# Patient Record
Sex: Female | Born: 1935
Health system: Southern US, Community
[De-identification: ages and names within clinical notes are randomized; demographics above are authoritative.]

## PROBLEM LIST (undated history)

## (undated) DIAGNOSIS — E039 Hypothyroidism, unspecified: Secondary | ICD-10-CM

## (undated) DIAGNOSIS — F329 Major depressive disorder, single episode, unspecified: Secondary | ICD-10-CM

## (undated) DIAGNOSIS — F32A Depression, unspecified: Secondary | ICD-10-CM

## (undated) DIAGNOSIS — K219 Gastro-esophageal reflux disease without esophagitis: Secondary | ICD-10-CM

## (undated) DIAGNOSIS — M1990 Unspecified osteoarthritis, unspecified site: Secondary | ICD-10-CM

## (undated) DIAGNOSIS — I209 Angina pectoris, unspecified: Secondary | ICD-10-CM

## (undated) DIAGNOSIS — I10 Essential (primary) hypertension: Secondary | ICD-10-CM

## (undated) DIAGNOSIS — F419 Anxiety disorder, unspecified: Secondary | ICD-10-CM

## (undated) HISTORY — PX: APPENDECTOMY: SHX54

## (undated) HISTORY — PX: OVARIAN CYST REMOVAL: SHX89

## (undated) HISTORY — PX: COLONOSCOPY: SHX174

## (undated) HISTORY — PX: CORONARY ARTERY BYPASS GRAFT: SHX141

## (undated) HISTORY — PX: UPPER GI ENDOSCOPY: SHX6162

## (undated) HISTORY — PX: EYE SURGERY: SHX253

---

## 1997-08-05 ENCOUNTER — Ambulatory Visit (HOSPITAL_COMMUNITY): Admission: RE | Admit: 1997-08-05 | Discharge: 1997-08-05 | Payer: Self-pay | Admitting: Obstetrics & Gynecology

## 1997-09-06 ENCOUNTER — Ambulatory Visit: Admission: RE | Admit: 1997-09-06 | Discharge: 1997-09-06 | Payer: Self-pay | Admitting: Cardiology

## 1997-09-26 ENCOUNTER — Ambulatory Visit (HOSPITAL_COMMUNITY): Admission: RE | Admit: 1997-09-26 | Discharge: 1997-09-26 | Payer: Self-pay | Admitting: Cardiology

## 1997-10-06 ENCOUNTER — Encounter: Payer: Self-pay | Admitting: Cardiothoracic Surgery

## 1997-10-07 ENCOUNTER — Encounter: Payer: Self-pay | Admitting: Cardiothoracic Surgery

## 1997-10-07 ENCOUNTER — Inpatient Hospital Stay (HOSPITAL_COMMUNITY): Admission: RE | Admit: 1997-10-07 | Discharge: 1997-10-12 | Payer: Self-pay | Admitting: Cardiothoracic Surgery

## 1997-10-09 ENCOUNTER — Encounter: Payer: Self-pay | Admitting: Cardiothoracic Surgery

## 1997-11-15 ENCOUNTER — Encounter (HOSPITAL_COMMUNITY): Admission: RE | Admit: 1997-11-15 | Discharge: 1998-02-13 | Payer: Self-pay | Admitting: Cardiology

## 1998-02-14 ENCOUNTER — Encounter (HOSPITAL_COMMUNITY): Admission: RE | Admit: 1998-02-14 | Discharge: 1998-05-15 | Payer: Self-pay | Admitting: Cardiology

## 1998-08-04 ENCOUNTER — Ambulatory Visit (HOSPITAL_COMMUNITY): Admission: RE | Admit: 1998-08-04 | Discharge: 1998-08-04 | Payer: Self-pay | Admitting: Obstetrics & Gynecology

## 1999-08-10 ENCOUNTER — Ambulatory Visit (HOSPITAL_COMMUNITY): Admission: RE | Admit: 1999-08-10 | Discharge: 1999-08-10 | Payer: Self-pay | Admitting: Obstetrics & Gynecology

## 1999-09-11 ENCOUNTER — Ambulatory Visit (HOSPITAL_COMMUNITY): Admission: RE | Admit: 1999-09-11 | Discharge: 1999-09-11 | Payer: Self-pay | Admitting: Obstetrics & Gynecology

## 1999-10-05 ENCOUNTER — Ambulatory Visit (HOSPITAL_COMMUNITY): Admission: RE | Admit: 1999-10-05 | Discharge: 1999-10-05 | Payer: Self-pay | Admitting: Obstetrics & Gynecology

## 1999-10-05 ENCOUNTER — Other Ambulatory Visit: Admission: RE | Admit: 1999-10-05 | Discharge: 1999-10-05 | Payer: Self-pay | Admitting: Obstetrics & Gynecology

## 1999-10-17 ENCOUNTER — Other Ambulatory Visit: Admission: RE | Admit: 1999-10-17 | Discharge: 1999-10-17 | Payer: Self-pay | Admitting: *Deleted

## 2000-09-23 ENCOUNTER — Encounter: Payer: Self-pay | Admitting: Internal Medicine

## 2001-10-29 ENCOUNTER — Other Ambulatory Visit: Admission: RE | Admit: 2001-10-29 | Discharge: 2001-10-29 | Payer: Self-pay | Admitting: *Deleted

## 2003-02-10 ENCOUNTER — Other Ambulatory Visit: Admission: RE | Admit: 2003-02-10 | Discharge: 2003-02-10 | Payer: Self-pay | Admitting: *Deleted

## 2004-02-18 ENCOUNTER — Emergency Department (HOSPITAL_COMMUNITY): Admission: EM | Admit: 2004-02-18 | Discharge: 2004-02-18 | Payer: Self-pay | Admitting: Family Medicine

## 2004-12-16 ENCOUNTER — Encounter: Admission: RE | Admit: 2004-12-16 | Discharge: 2004-12-16 | Payer: Self-pay | Admitting: Internal Medicine

## 2005-01-01 ENCOUNTER — Encounter: Admission: RE | Admit: 2005-01-01 | Discharge: 2005-01-01 | Payer: Self-pay | Admitting: Internal Medicine

## 2005-01-15 ENCOUNTER — Encounter: Admission: RE | Admit: 2005-01-15 | Discharge: 2005-01-15 | Payer: Self-pay | Admitting: Internal Medicine

## 2007-03-04 ENCOUNTER — Encounter: Admission: RE | Admit: 2007-03-04 | Discharge: 2007-03-04 | Payer: Self-pay | Admitting: Cardiology

## 2007-03-06 ENCOUNTER — Encounter: Admission: RE | Admit: 2007-03-06 | Discharge: 2007-03-06 | Payer: Self-pay | Admitting: Cardiology

## 2007-05-06 ENCOUNTER — Ambulatory Visit: Payer: Self-pay | Admitting: Internal Medicine

## 2007-05-06 DIAGNOSIS — K5730 Diverticulosis of large intestine without perforation or abscess without bleeding: Secondary | ICD-10-CM | POA: Insufficient documentation

## 2007-05-06 DIAGNOSIS — I2510 Atherosclerotic heart disease of native coronary artery without angina pectoris: Secondary | ICD-10-CM | POA: Insufficient documentation

## 2007-05-06 DIAGNOSIS — K3189 Other diseases of stomach and duodenum: Secondary | ICD-10-CM | POA: Insufficient documentation

## 2007-05-06 DIAGNOSIS — R1013 Epigastric pain: Secondary | ICD-10-CM

## 2007-05-06 DIAGNOSIS — R12 Heartburn: Secondary | ICD-10-CM | POA: Insufficient documentation

## 2007-05-06 DIAGNOSIS — J387 Other diseases of larynx: Secondary | ICD-10-CM | POA: Insufficient documentation

## 2007-05-06 DIAGNOSIS — K219 Gastro-esophageal reflux disease without esophagitis: Secondary | ICD-10-CM | POA: Insufficient documentation

## 2007-05-13 ENCOUNTER — Encounter: Payer: Self-pay | Admitting: Internal Medicine

## 2007-05-13 ENCOUNTER — Ambulatory Visit (HOSPITAL_COMMUNITY): Admission: RE | Admit: 2007-05-13 | Discharge: 2007-05-13 | Payer: Self-pay | Admitting: Internal Medicine

## 2007-05-26 ENCOUNTER — Encounter: Admission: RE | Admit: 2007-05-26 | Discharge: 2007-05-26 | Payer: Self-pay | Admitting: Internal Medicine

## 2007-06-02 ENCOUNTER — Encounter: Payer: Self-pay | Admitting: Internal Medicine

## 2007-06-02 ENCOUNTER — Ambulatory Visit (HOSPITAL_COMMUNITY): Admission: RE | Admit: 2007-06-02 | Discharge: 2007-06-02 | Payer: Self-pay | Admitting: Internal Medicine

## 2007-06-11 ENCOUNTER — Ambulatory Visit: Payer: Self-pay | Admitting: Internal Medicine

## 2008-06-07 ENCOUNTER — Ambulatory Visit (HOSPITAL_COMMUNITY): Admission: RE | Admit: 2008-06-07 | Discharge: 2008-06-07 | Payer: Self-pay | Admitting: Internal Medicine

## 2009-06-11 IMAGING — CR DG CHEST 2V
2 series · 2 of 2 positions shown · non-contrast
Comparison: 03/04/2007

CLINICAL DATA: Right chest wall pain following fall

CHEST - 2 VIEW

[w chest pa]
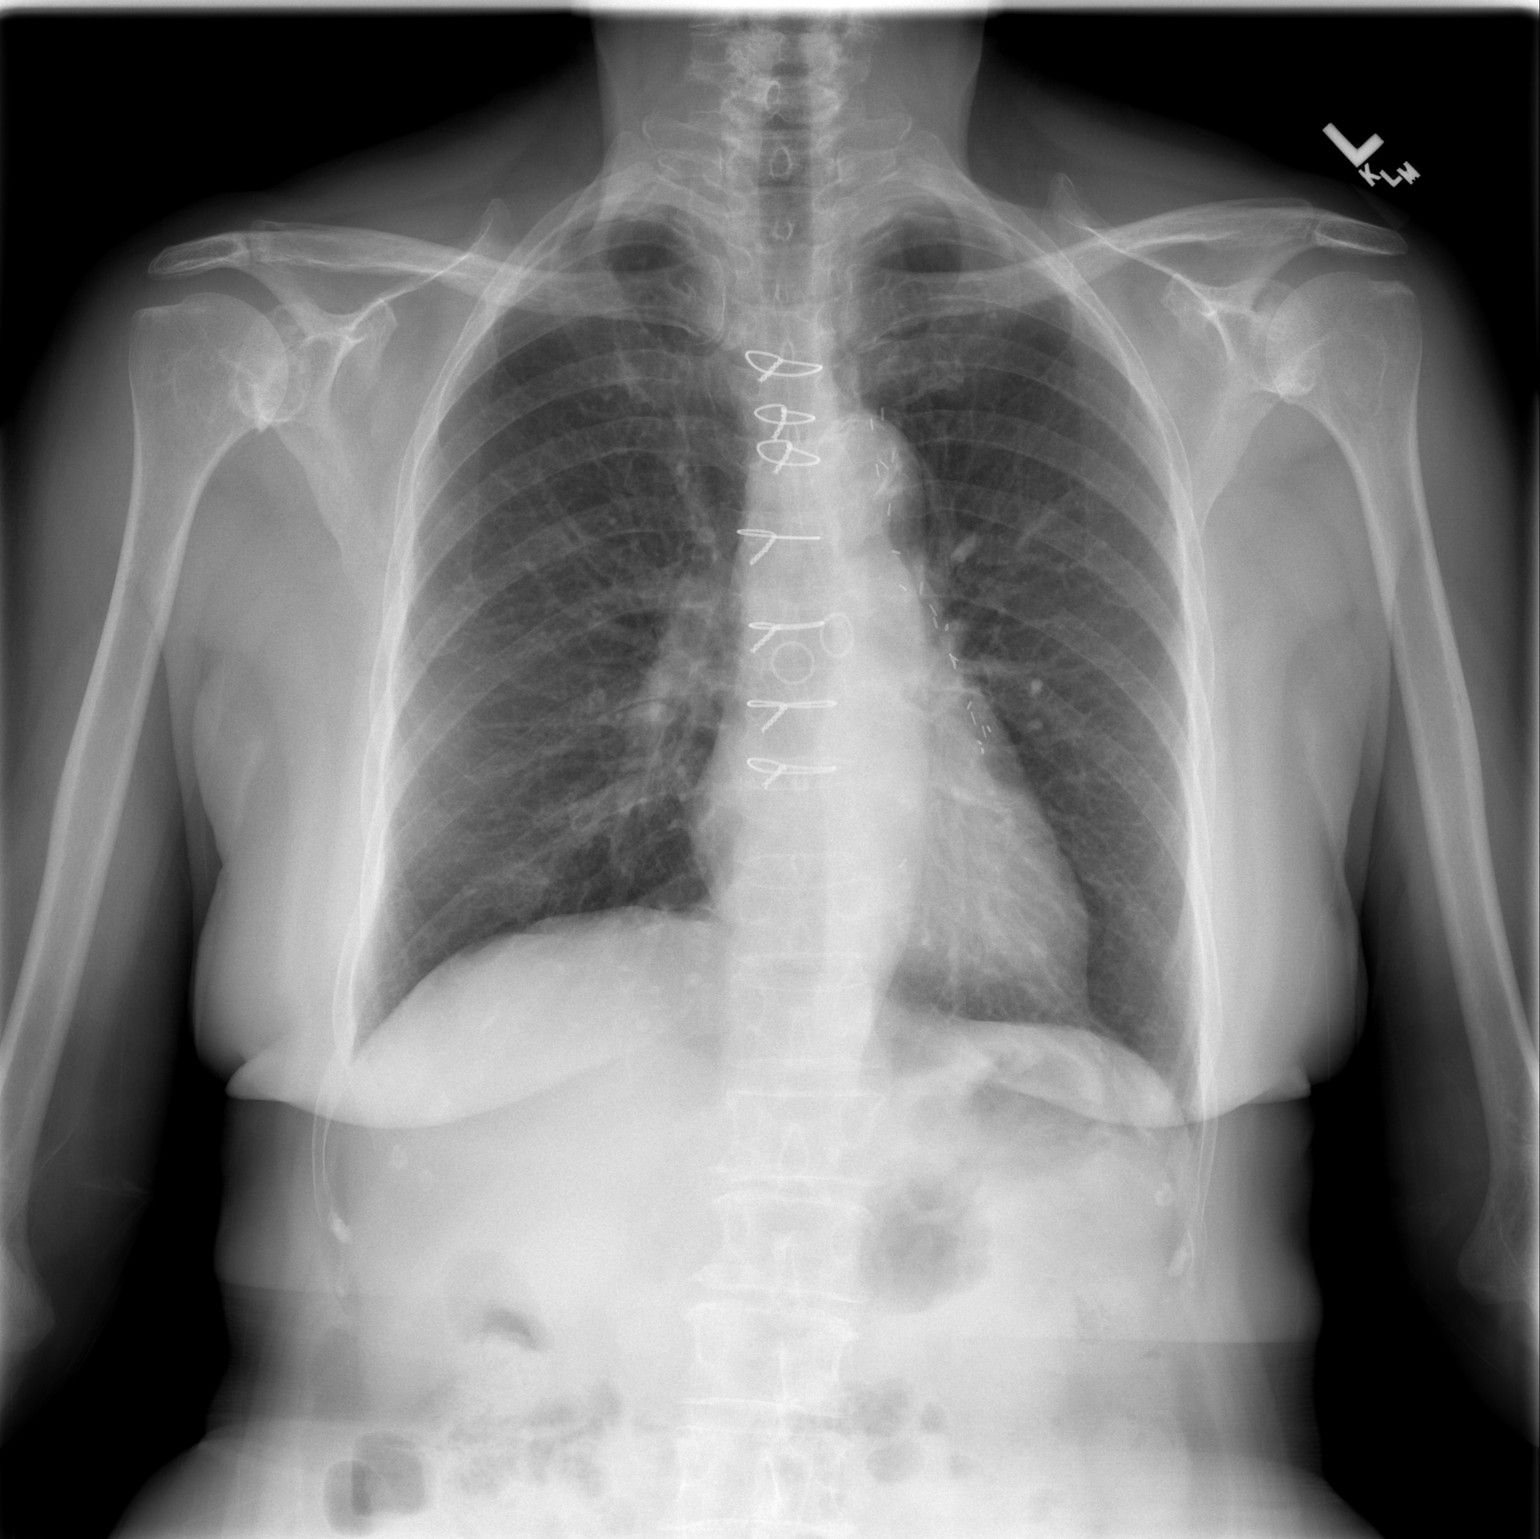

[w chest lat]
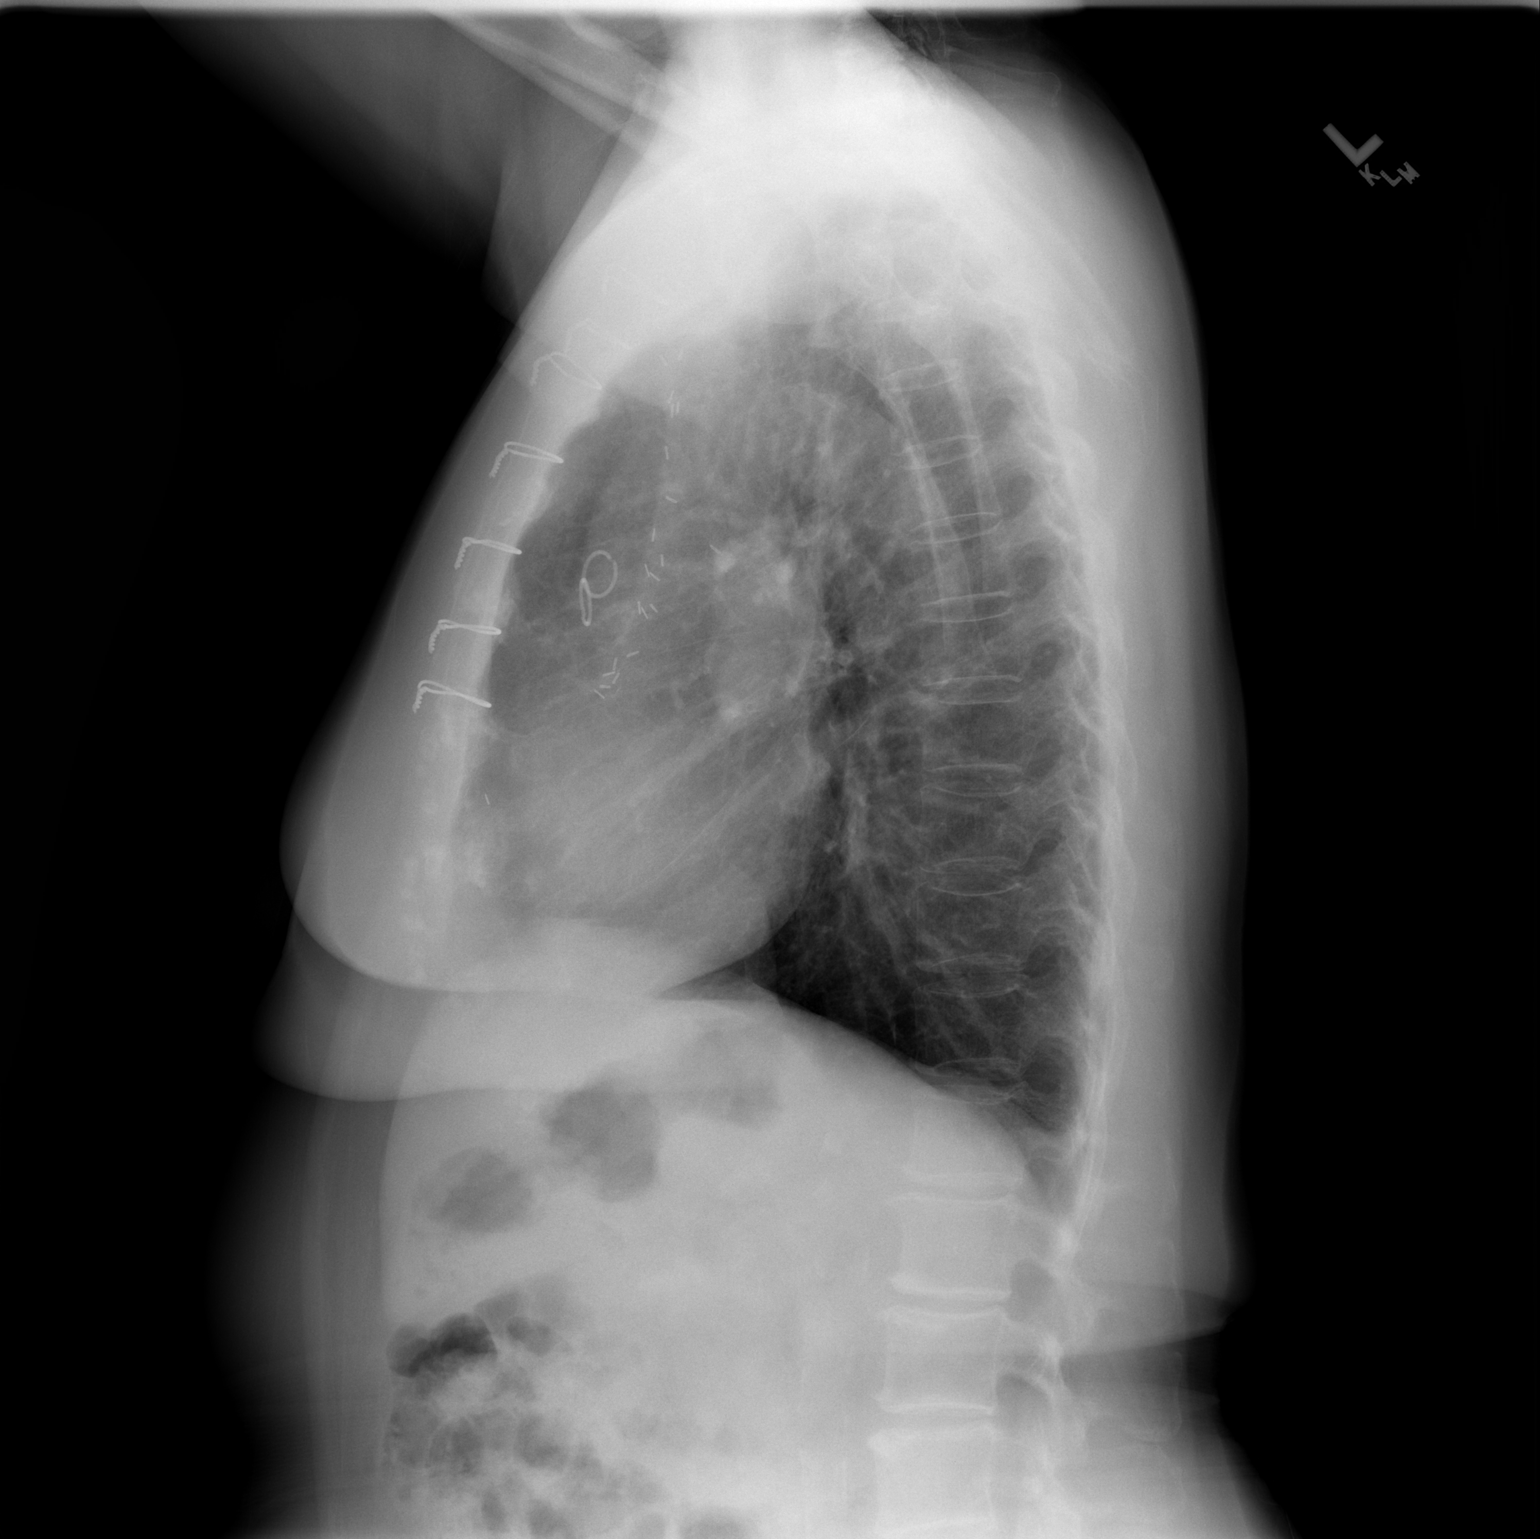

[2 of 2 positions shown; findings below may reference images not displayed]

FINDINGS: Post CABG.  Heart and lungs normal.  No definite rib
fractures pneumothorax or pleural fluid.
IMPRESSION: 1.Post CABG - no active disease.
2.  No visible rib fractures or acute changes.

## 2009-06-13 ENCOUNTER — Ambulatory Visit: Payer: Self-pay | Admitting: Radiology

## 2009-06-13 ENCOUNTER — Emergency Department (HOSPITAL_BASED_OUTPATIENT_CLINIC_OR_DEPARTMENT_OTHER): Admission: EM | Admit: 2009-06-13 | Discharge: 2009-06-13 | Payer: Self-pay | Admitting: Emergency Medicine

## 2009-07-03 ENCOUNTER — Encounter (INDEPENDENT_AMBULATORY_CARE_PROVIDER_SITE_OTHER): Payer: Self-pay | Admitting: *Deleted

## 2009-07-28 DIAGNOSIS — R197 Diarrhea, unspecified: Secondary | ICD-10-CM | POA: Insufficient documentation

## 2009-07-28 DIAGNOSIS — R498 Other voice and resonance disorders: Secondary | ICD-10-CM

## 2009-07-28 DIAGNOSIS — K2990 Gastroduodenitis, unspecified, without bleeding: Secondary | ICD-10-CM

## 2009-07-28 DIAGNOSIS — K2970 Gastritis, unspecified, without bleeding: Secondary | ICD-10-CM | POA: Insufficient documentation

## 2009-08-16 ENCOUNTER — Inpatient Hospital Stay (HOSPITAL_COMMUNITY): Admission: EM | Admit: 2009-08-16 | Discharge: 2009-08-18 | Payer: Self-pay | Admitting: Emergency Medicine

## 2009-08-16 ENCOUNTER — Encounter (INDEPENDENT_AMBULATORY_CARE_PROVIDER_SITE_OTHER): Payer: Self-pay | Admitting: Cardiology

## 2009-09-06 ENCOUNTER — Ambulatory Visit: Payer: Self-pay | Admitting: Internal Medicine

## 2009-10-13 ENCOUNTER — Emergency Department (HOSPITAL_COMMUNITY): Admission: EM | Admit: 2009-10-13 | Discharge: 2009-10-13 | Payer: Self-pay | Admitting: Emergency Medicine

## 2009-10-13 ENCOUNTER — Emergency Department (HOSPITAL_COMMUNITY): Admission: EM | Admit: 2009-10-13 | Discharge: 2009-10-14 | Payer: Self-pay | Admitting: Emergency Medicine

## 2010-03-01 NOTE — Letter (Signed)
Summary: Appt Reminder 2  [facility]  [address] [place], [place] [address]   Phone: [phone]  Fax: [phone]        July 03, 2009 MRN: [id number]    Wendy Wiley [address] [place], [place]  [address]    Dear [name],   You have a return appointment with Dr. Juanda Chance on July 7th at 11am.  Please remember to bring a complete list of the medicines you are taking, your insurance card and your co-pay.  If you have to cancel or reschedule this appointment, please call before 5:00 pm the evening before to avoid a cancellation fee.  If you have any questions or concerns, please call [phone].    Sincerely,  [employer]

## 2010-03-01 NOTE — Assessment & Plan Note (Signed)
Summary: RECURRENT DIARRHEA/YF   History of Present Illness Visit Type: Follow-up Visit Primary GI MD: Lina Sar MD Primary Provider: Rodrigo Ran, MD Requesting Provider: Rodrigo Ran, MD Chief Complaint: Patient states that she had diarrhea and vomiting over a month and a half ago but nothing since. She is not sure why she is here she is having no problems now, sha had a sooner appt but changed it to today.  History of Present Illness:   This is an 77 year old white female who is status post acute gastroenteritis presenting with dyspepsia, nausea and diarrhea. It has subsided spontaneously and she has been well now for several weeks. She denies any rectal bleeding. Her last colonoscopy in 2002 was normal except for mild diverticulosis. She also had an upper endoscopy in 2002 and again in May 2009 which showed gastritis. She denies any abdominal pain. While having diarrhea, patient lost 5 pounds but has regained it. 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   GI Review of Systems      Denies abdominal pain, acid reflux, belching, bloating, chest pain, dysphagia with liquids, dysphagia with solids, heartburn, loss of appetite, nausea, vomiting, vomiting blood, weight loss, and  weight gain.        Denies anal fissure, black tarry stools, change in bowel habit, constipation, diarrhea, diverticulosis, fecal incontinence, heme positive stool, hemorrhoids, irritable bowel syndrome, jaundice, light color stool, liver problems, rectal bleeding, and  rectal pain. Preventive Screening-Counseling & Management  Alcohol-Tobacco     Smoking Status: never    Current Medications (verified): 1)  Nexium 40 Mg Cpdr (Esomeprazole Magnesium) .... Take One By Mouth Once Daily 2)  Thyrolar-2 120 (25-100) Mg (Mcg) Tabs (Liotrix (T3-T4)) .... Take One By Mouth Every Day For Three Days Then 1.5 Tabs By Mouth For The Other Fours Days 3)  Lipitor 10 Mg Tabs  (Atorvastatin Calcium) .... Take Oen By Mouth Once Daily 4)  Triamterene-Hctz 37.5-25 Mg Tabs (Triamterene-Hctz) .... Take One By Mouth Once Daily 5)  Aspirin 81 Mg Tbec (Aspirin) .... Take One By Mouth Once Daily 6)  Icaps  Caps (Multiple Vitamins-Minerals) .... Take One By Mouth Two Times A Day 7)  One-A-Day Extras Antioxidant  Caps (Multiple Vitamins-Minerals) .... Take One By Mouth Once Daily 8)  Move Free Advanced Tablets 1500mg  .... Take Two By Mouth Once Daily 9)  Fish Oil 1000 Mg Caps (Omega-3 Fatty Acids) .... Take One By Mouth Two Times A Day 10)  Lexapro 10 Mg Tabs (Escitalopram Oxalate) .... Take 1/2 Tablet By Mouth Once Daily 11)  Norvasc 2.5 Mg Tabs (Amlodipine Besylate) .... Take One By Mouth Once Daily  Allergies (verified): 1)  ! Cipro 2)  ! Codeine 3)  ! Quinine  Past History:  Past Medical History: Reviewed history from 07/28/2009 and no changes required. Current Problems:  DIARRHEA (ICD-787.91) Hx of GASTRITIS (ICD-535.50) DYSPHONIA, SPASTIC (ICD-784.49) DYSPEPSIA (ICD-536.8) GERD (ICD-530.81) HEARTBURN (ICD-787.1) CAD (ICD-414.00) Hx of DIVERTICULOSIS, MILD (ICD-562.10) OTHER DISEASES OF LARYNX (ICD-478.79)    Past Surgical History: CABG Botox injection into the vocal cords every 5-6 months tumors removed from overies  Family History: No FH of Colon Cancer: Family History of Stomach Cancer: maternal aunt Family History of Heart Disease: father, brother  Social History: Alcohol Use - no Illicit Drug Use - no Patient has never smoked.  Daily Caffeine Use coffee  Review of Systems       The patient complains of allergy/sinus, anxiety-new, arthritis/joint pain, back  pain, change in vision, confusion, cough, depression-new, fatigue, sleeping problems, thirst - excessive, urination - excessive, and voice change.  The patient denies anemia, blood in urine, breast changes/lumps, coughing up blood, fainting, fever, headaches-new, hearing problems, heart  murmur, heart rhythm changes, itching, menstrual pain, muscle pains/cramps, night sweats, nosebleeds, pregnancy symptoms, shortness of breath, skin rash, sore throat, swelling of feet/legs, swollen lymph glands, thirst - excessive , urination - excessive , urination changes/pain, urine leakage, and vision changes.         Pertinent positive and negative review of systems were noted in the above HPI. All other ROS was otherwise negative.   Vital Signs:  Patient profile:   77 year old female Height:      57 inches Weight:      118.0 pounds BMI:     25.63 Pulse rate:   64 / minute Pulse rhythm:   regular BP sitting:   118 / 54  (left arm) Cuff size:   regular  Vitals Entered By: Harlow Mares CMA Duncan Dull) (September 06, 2009 2:19 PM)  Physical Exam  General:  Well developed, well nourished, no acute distress. Eyes:  PERRLA, no icterus. Mouth:  No deformity or lesions, dentition normal. Neck:  left carotid bruit , no bruit in the right carotid Lungs:  Clear throughout to auscultation. Heart:  soft 2/6 systolic murmur. Extremities:  No clubbing, cyanosis, edema or deformities noted. Skin:  Intact without significant lesions or rashes. Psych:  Alert and cooperative. Normal mood and affect.   Impression & Recommendations:  Problem # 1:  DIARRHEA (ICD-787.91) Patient's diarrhea has subsided. She had viral gastroenteritis. No further diagnostic workup is necessary. She will be due for a screening colonoscopy in August 2012. She will be 77 years old at that time and we will decide whether it is indicated at that time.  Problem # 2:  DYSPHONIA, SPASTIC (ICD-784.49) Patient is to continue treatments at [facility].  Problem # 3:  GERD (ICD-530.81) Patient is asymptomatic. She is to continue Nexium 40 mg daily.  Problem # 4:  CAD (ICD-414.00) Patient had a left carotid bruit on today's exam. I have suggested she talks about it with Dr. Waynard Edwards and with her cardiologist.  Patient  Instructions: 1)  continue Nexium 40 mg daily. 2)  Consider recall colonoscopy August 2012. 3)  Return p.r.n. 4)  Copy sent to : Dr Waynard Edwards 5)  The medication list was reviewed and reconciled.  All changed / newly prescribed medications were explained.  A complete medication list was provided to the patient / caregiver.

## 2010-03-01 NOTE — Procedures (Signed)
Summary: COLON   Colonoscopy  Procedure date:  09/23/2000  Findings:      Location:  [facility].   Patient Name: Wendy Wiley, Wendy Wiley MRN: [id number] Procedure Procedures: Colonoscopy CPT: [phone].  Personnel: Endoscopist: Trelyn Vanderlinde L. Juanda Chance, MD.  Referred By: Rodrigo Ran, MD.  Exam Location: Exam performed in Outpatient Clinic. Outpatient  Patient Consent: Procedure, Alternatives, Risks and Benefits discussed, consent obtained, from patient.  Indications  Average Risk Screening Routine.  History  Pre-Exam Physical: Performed Sep 23, 2000. Cardio-pulmonary exam, HEENT exam , Abdominal exam, Extremity exam, Neurological exam, Mental status exam WNL.  Exam Exam: Extent of exam reached: Cecum, extent intended: Cecum.  Colon retroflexion performed. Images taken. ASA Classification: II. Tolerance: good.  Monitoring: Pulse and BP monitoring, Oximetry used. Supplemental O2 given.  Colon Prep Used Golytely for colon prep. Prep results: fair.  Sedation Meds: Patient assessed and found to be appropriate for moderate (conscious) sedation. Fentanyl 50 mcg. Versed 2 mg.  Findings , IMAGE TAKEN - DIVERTICULOSIS: Sigmoid Colon. Not bleeding. ICD9: Diverticulosis: 562.10.   Assessment Abnormal examination, see findings above.  Diagnoses: 562.10: Diverticulosis.   Comments: mild diverticulosis Events  Unplanned Interventions: No intervention was required.  Unplanned Events: There were no complications. Plans Patient Education: Patient given standard instructions for: Yearly hemoccult testing recommended. Patient instructed to get routine colonoscopy every 10 years.  Comments: high fiber diet Disposition: After procedure patient sent to recovery. After recovery patient sent home.   This report was created from the original endoscopy report, which was reviewed and signed by the above listed endoscopist.

## 2010-04-14 LAB — CBC
HCT: 32.8 % — ABNORMAL LOW (ref 36.0–46.0)
Hemoglobin: 13.5 g/dL (ref 12.0–15.0)
MCH: 29.8 pg (ref 26.0–34.0)
MCHC: 33.5 g/dL (ref 30.0–36.0)
MCV: 88.9 fL (ref 78.0–100.0)
RBC: 4.55 MIL/uL (ref 3.87–5.11)
RDW: 15.7 % — ABNORMAL HIGH (ref 11.5–15.5)
WBC: 13.7 10*3/uL — ABNORMAL HIGH (ref 4.0–10.5)

## 2010-04-14 LAB — DIFFERENTIAL
Basophils Absolute: 0 10*3/uL (ref 0.0–0.1)
Basophils Relative: 0 % (ref 0–1)
Eosinophils Absolute: 0.1 10*3/uL (ref 0.0–0.7)
Eosinophils Relative: 1 % (ref 0–5)
Eosinophils Relative: 3 % (ref 0–5)
Lymphs Abs: 1.3 10*3/uL (ref 0.7–4.0)
Monocytes Absolute: 1.2 10*3/uL — ABNORMAL HIGH (ref 0.1–1.0)
Monocytes Absolute: 1.4 10*3/uL — ABNORMAL HIGH (ref 0.1–1.0)
Monocytes Relative: 9 % (ref 3–12)
Neutro Abs: 11.1 10*3/uL — ABNORMAL HIGH (ref 1.7–7.7)
Neutrophils Relative %: 81 % — ABNORMAL HIGH (ref 43–77)

## 2010-04-14 LAB — BASIC METABOLIC PANEL
CO2: 27 mEq/L (ref 19–32)
Calcium: 9 mg/dL (ref 8.4–10.5)
GFR calc Af Amer: >60 mL/min (ref >60)
GFR calc non Af Amer: >60 mL/min (ref >60)
Sodium: 134 mEq/L — ABNORMAL LOW (ref 135–145)

## 2010-04-14 LAB — COMPREHENSIVE METABOLIC PANEL
ALT: 18 U/L (ref 0–35)
Albumin: 3.3 g/dL — ABNORMAL LOW (ref 3.5–5.2)
Alkaline Phosphatase: 66 U/L (ref 39–117)
Glucose, Bld: 113 mg/dL — ABNORMAL HIGH (ref 70–99)
Potassium: 4 mEq/L (ref 3.5–5.1)
Sodium: 134 mEq/L — ABNORMAL LOW (ref 135–145)
Total Protein: 6.1 g/dL (ref 6.0–8.3)

## 2010-04-14 LAB — CULTURE, BLOOD (ROUTINE X 2): Culture: NO GROWTH

## 2010-04-14 LAB — CK TOTAL AND CKMB (NOT AT ARMC)
CK, MB: 1.2 ng/mL (ref 0.3–4.0)
Relative Index: INVALID (ref 0.0–2.5)
Total CK: 34 U/L (ref 7–177)
Total CK: 44 U/L (ref 7–177)

## 2010-04-14 LAB — PROTIME-INR: Prothrombin Time: 12.5 seconds (ref 11.6–15.2)

## 2010-04-14 LAB — D-DIMER, QUANTITATIVE: D-Dimer, Quant: 0.38 ug/mL-FEU (ref 0.00–0.48)

## 2010-06-12 NOTE — Assessment & Plan Note (Signed)
[facility]                         GASTROENTEROLOGY OFFICE NOTE   NAME:Wendy Wiley, Wendy Wiley                        MRN:          [id number]  DATE:05/06/2007                            DOB:          1922/04/25    Ms. Jahr is an 77 year old white female who is sent by Dr. Waynard Edwards for  evaluation of indigestion. Her specific complaints are nocturnal cough,  choking on solids and liquids, belching, and uneasy feeling in her  epigastrium. This has been going on chronically for many years. In fact,  we saw Ms. Butchko with similar problems in 2002 when she underwent upper  endoscopy. Her H. pylori test was negative, and her exam was essentially  normal. She since then has had continued problems with spastic dysphonia  for which received Botox injections every 5 to 6 months at [facility] in [place]. She has occasional water brash reflux at  night and hoarseness. The pain in the upper abdomen was evaluated to  rule out cardiac causes, and Dr. Donnie Aho has fully evaluated her for it.  The discomfort in the chest and epigastrium occurs mostly after meals.  The symptoms have not improved after she discontinued Fosamax 70 mg  weekly. She has not taken the medicine for the past 2 weeks. Upper  abdominal ultrasound done by Dr. Waynard Edwards in February 2009 was negative.   MEDICATIONS:  1. Tri-Vita, B12 sublingually.  2. Nexium 40 mg q.a.m.  3. Amlodipine 2.5 mg q.p.m.  4. Lipitor 10 mg h.s.  5. Zetia 10 mg h.s.  6. Ramipril 2.5 mg every day.  7. Thyroid 1 to 1-1/2 tablets every other day, alternating dose.  8. Triamterene HCTZ 37.5/25 one p.o. every day.  9. Lexapro 10 mg 1/2 tablet q.p.m.  10.Aspirin 81 mg q.p.m.  11.Multivitamins.  12.Fish oil.  13.Calcium supplements.   PAST HISTORY:  1. Spastic dysphonia.  2. High blood pressure.  3. Hyperlipidemia.  4. Thyroid problems.  5. Arthritis.  6. Anxiety.  7. Depression.   ALLERGIES:  None.   OPERATIONS:  Coronary artery bypass graft around 1999.   FAMILY HISTORY:  Negative for colon cancer.   SOCIAL HISTORY:  Married with 2 children. She is has some college  education. She does not smoke and does not drink alcohol.   REVIEW OF SYSTEMS:  Positive for fever, eyeglasses, allergies, frequent  cough, arthritic complaints, sleeping problems, night sweats, vision  changes, back pain, and leakage of urine.   PHYSICAL EXAMINATION:  VITAL SIGNS:  Blood pressure 110/60, pulse 60,  weight 118 pounds.  HEENT:  She had spastic dysphonia. There was no cough during our exam.  Oral cavity was normal.  NECK:  Supply, no lymphadenopathy.  LUNGS:  Clear to auscultation.  COR:  Normal S1, normal S2.  ABDOMEN:  Soft, mildly protuberant but nontender with normoactive bowel  sounds. Well-healed thoracotomy scar extending into the upper abdomen.  Liver edge at the costal margin. Lower abdomen was normal.  RECTAL:  Soft, Hemoccult negative stool.  EXTREMITIES:  No edema.   IMPRESSION:  1. An 77 year old white female with nonspecific dyspepsia of  several      years' duration. She has additional problems of dysphagia and      nocturnal cough suggestive of gastroesophageal reflux or possibly      nocturnal aspiration. I would consider possibility of esophageal      dysmotility or possibly upper esophageal sphincter dysfunction. Her      ultrasound of the gallbladder was negative. Discontinuation of      Fosamax did not result in an improvement of her symptoms, although      the interval since Fosamax was discontinued has been only 2 weeks,      and in my experience it sometimes takes several months for the      gastrointestinal symptoms to disappear.  2. Known diverticulosis from last colonoscopy done in 2002.   PLAN:  1. Modified barium swallow with speech pathology to assess her      swallowing. Rule out penetration and aspiration.  2. Upper endoscopy to assess for any anatomic abnormality  such as      hiatal hernia, stricture.  3. Nexium 40 mg q.a.m. Add Prilosec 20 mg q.p.m.  4. Continue to hold Fosamax at this time.  5. Consider esophageal motility assessment depending on the results of      the modified barium swallow.     Hedwig Morton. Juanda Chance, MD  Electronically Signed    DMB/MedQ  DD: 05/06/2007  DT: 05/06/2007  Job #: [id number]   cc:   Georga Hacking, M.D.  Redge Gainer. Perini, M.D.

## 2011-09-02 IMAGING — CR DG CHEST 2V
2 series · 2 of 2 positions shown · non-contrast
Comparison: 05/26/2007

CLINICAL DATA: Left chest pain.  Hypertension.  Shortness of
breath.

CHEST - 2 VIEW

[w chest pa]
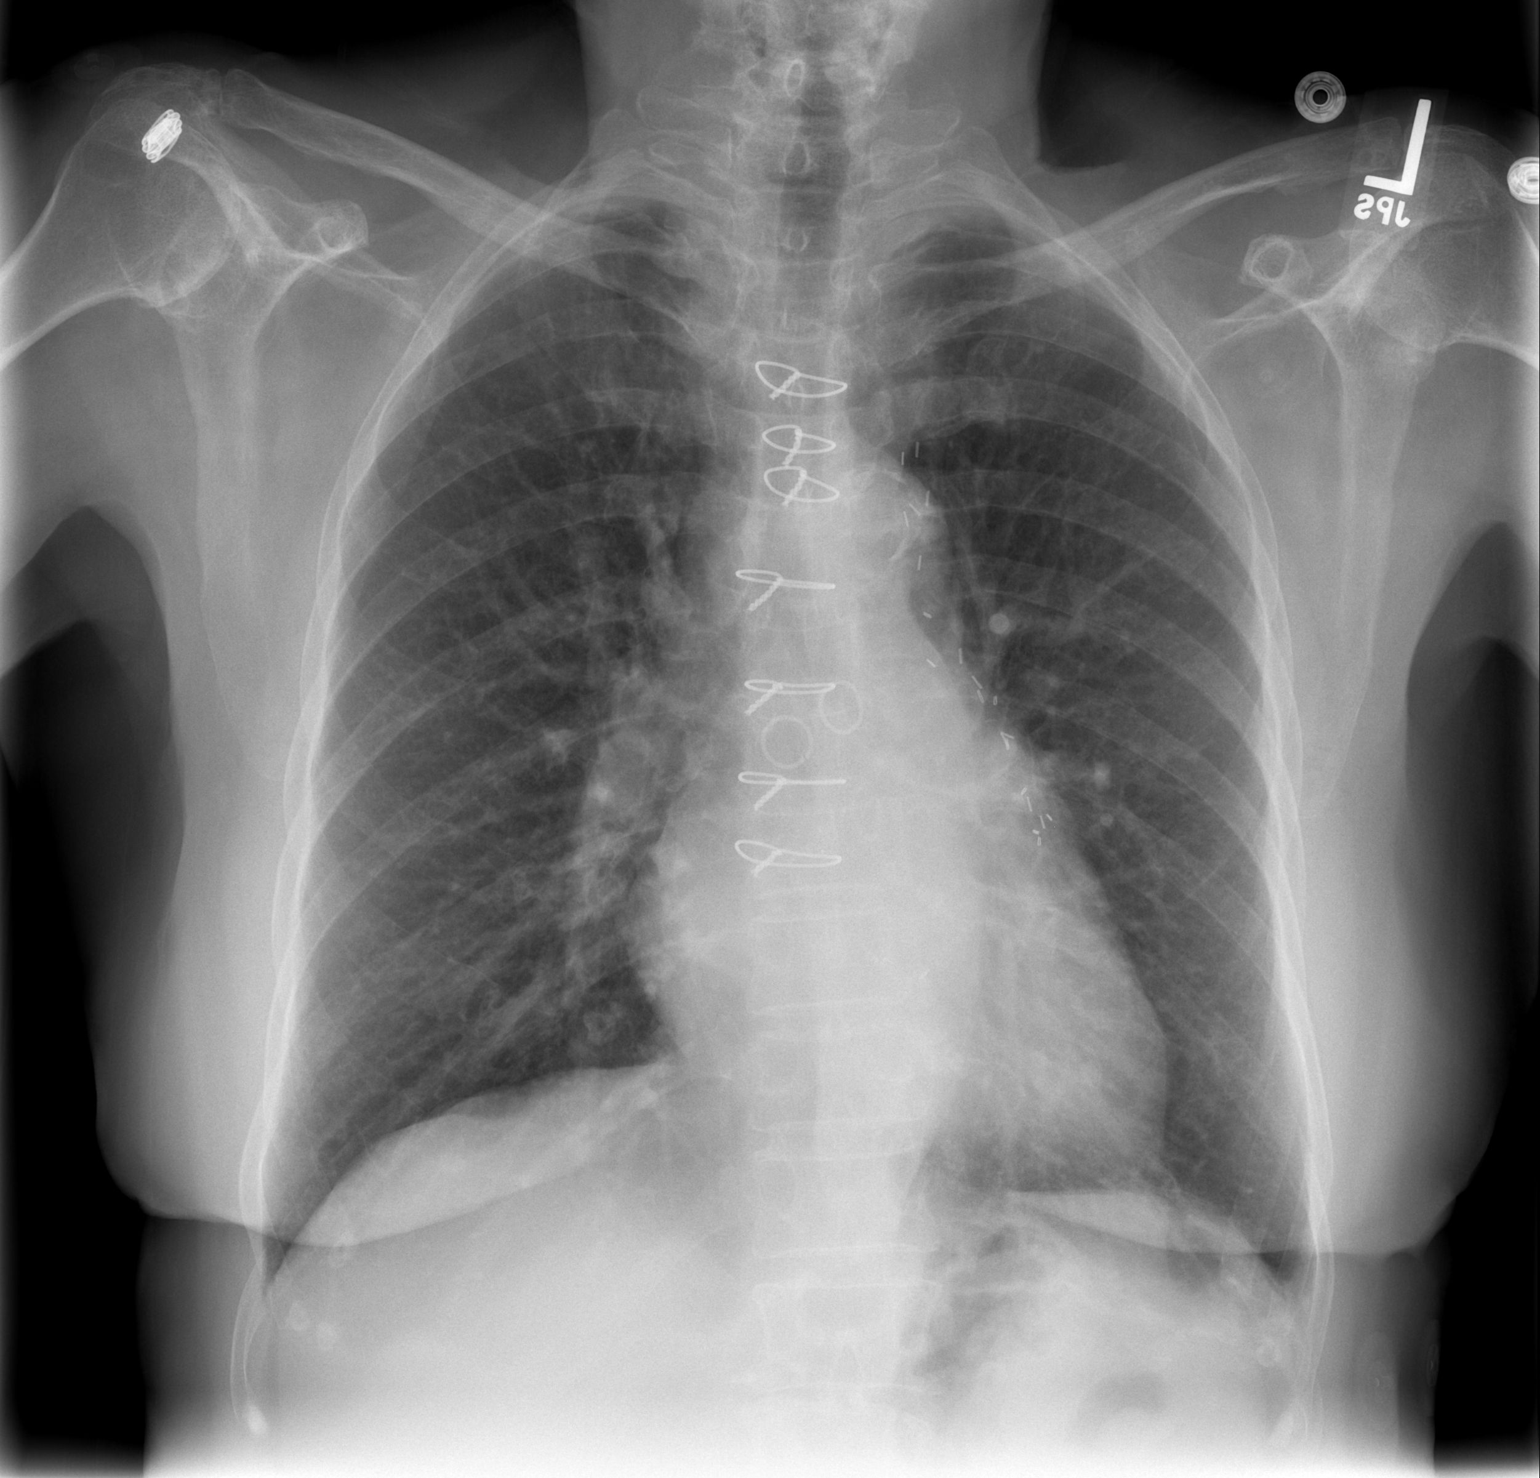

[w chest lat]
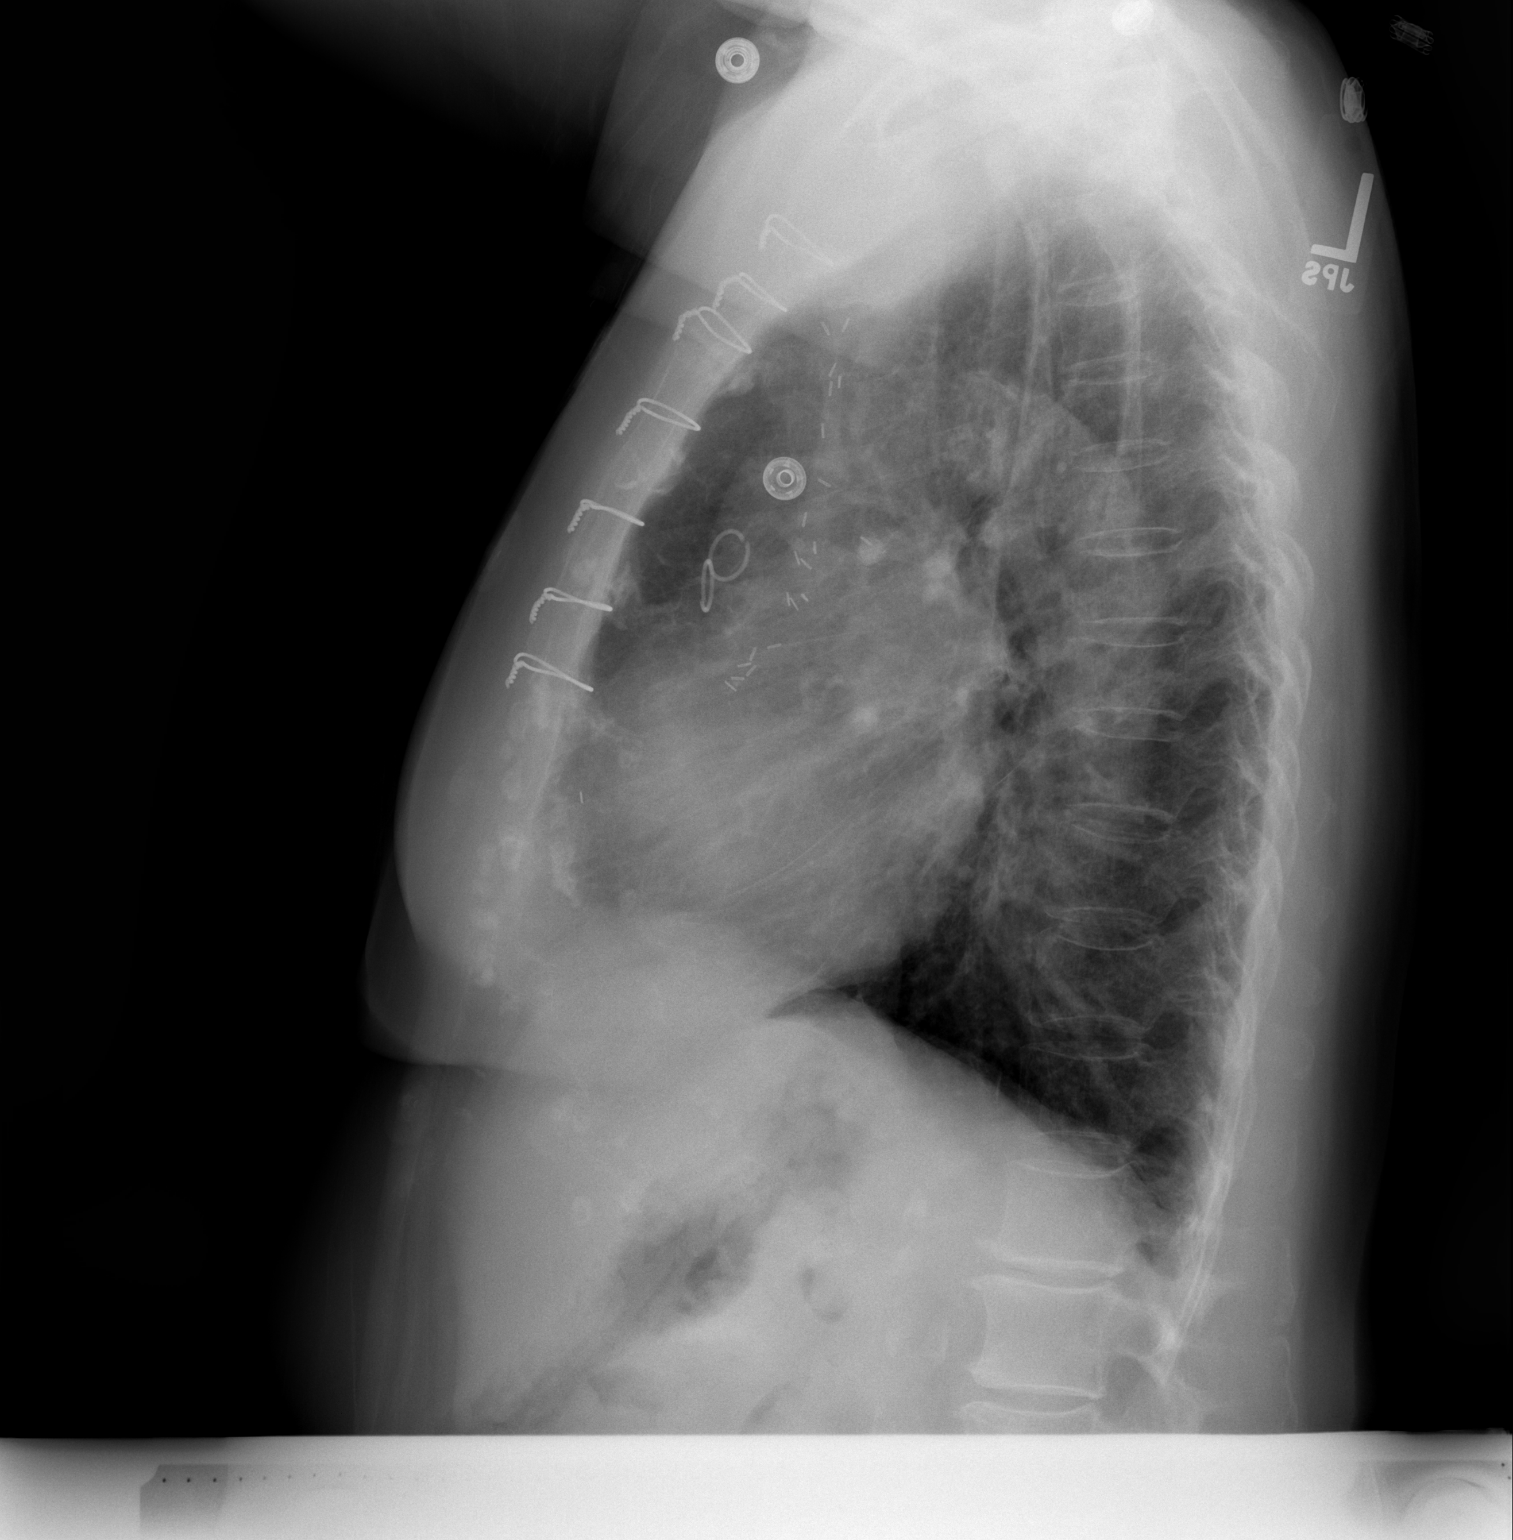

[2 of 2 positions shown; findings below may reference images not displayed]

FINDINGS: Prior CABG noted.  Atherosclerotic calcification of the
aortic arch is present.

The lungs appear clear.
IMPRESSION: 1.  Atherosclerosis and prior CABG.

## 2012-03-31 ENCOUNTER — Ambulatory Visit: Payer: Medicare Other | Admitting: Physical Therapy

## 2012-04-08 ENCOUNTER — Ambulatory Visit: Payer: Medicare Other | Attending: Orthopaedic Surgery | Admitting: Physical Therapy

## 2012-04-08 DIAGNOSIS — M545 Low back pain, unspecified: Secondary | ICD-10-CM | POA: Insufficient documentation

## 2012-04-08 DIAGNOSIS — M25519 Pain in unspecified shoulder: Secondary | ICD-10-CM | POA: Insufficient documentation

## 2012-04-08 DIAGNOSIS — M25559 Pain in unspecified hip: Secondary | ICD-10-CM | POA: Insufficient documentation

## 2012-04-08 DIAGNOSIS — IMO0001 Reserved for inherently not codable concepts without codable children: Secondary | ICD-10-CM | POA: Insufficient documentation

## 2012-04-15 ENCOUNTER — Ambulatory Visit: Payer: Medicare Other | Admitting: Physical Therapy

## 2012-04-21 ENCOUNTER — Ambulatory Visit: Payer: Medicare Other | Admitting: Physical Therapy

## 2012-04-23 ENCOUNTER — Ambulatory Visit: Payer: Medicare Other | Admitting: Physical Therapy

## 2012-04-28 ENCOUNTER — Ambulatory Visit: Payer: Medicare Other | Attending: Orthopaedic Surgery | Admitting: Physical Therapy

## 2012-04-28 DIAGNOSIS — M545 Low back pain, unspecified: Secondary | ICD-10-CM | POA: Insufficient documentation

## 2012-04-28 DIAGNOSIS — M25519 Pain in unspecified shoulder: Secondary | ICD-10-CM | POA: Insufficient documentation

## 2012-04-28 DIAGNOSIS — M25559 Pain in unspecified hip: Secondary | ICD-10-CM | POA: Insufficient documentation

## 2012-04-28 DIAGNOSIS — IMO0001 Reserved for inherently not codable concepts without codable children: Secondary | ICD-10-CM | POA: Insufficient documentation

## 2012-04-29 ENCOUNTER — Ambulatory Visit: Payer: Medicare Other | Admitting: Physical Therapy

## 2012-04-30 ENCOUNTER — Ambulatory Visit: Payer: Medicare Other | Admitting: Physical Therapy

## 2012-05-05 ENCOUNTER — Ambulatory Visit: Payer: Medicare Other | Admitting: Physical Therapy

## 2012-05-07 ENCOUNTER — Ambulatory Visit: Payer: Medicare Other | Admitting: Physical Therapy

## 2012-05-12 ENCOUNTER — Ambulatory Visit: Payer: Medicare Other | Admitting: Physical Therapy

## 2012-05-19 ENCOUNTER — Ambulatory Visit: Payer: Medicare Other | Admitting: Physical Therapy

## 2012-05-21 ENCOUNTER — Ambulatory Visit: Payer: Medicare Other | Admitting: Physical Therapy

## 2012-05-26 ENCOUNTER — Ambulatory Visit: Payer: Medicare Other | Admitting: Physical Therapy

## 2012-05-28 ENCOUNTER — Ambulatory Visit: Payer: Medicare Other | Attending: Orthopaedic Surgery | Admitting: Physical Therapy

## 2012-05-28 DIAGNOSIS — M545 Low back pain, unspecified: Secondary | ICD-10-CM | POA: Insufficient documentation

## 2012-05-28 DIAGNOSIS — M25559 Pain in unspecified hip: Secondary | ICD-10-CM | POA: Insufficient documentation

## 2012-05-28 DIAGNOSIS — IMO0001 Reserved for inherently not codable concepts without codable children: Secondary | ICD-10-CM | POA: Insufficient documentation

## 2012-05-28 DIAGNOSIS — M25519 Pain in unspecified shoulder: Secondary | ICD-10-CM | POA: Insufficient documentation

## 2012-06-01 ENCOUNTER — Ambulatory Visit: Payer: Medicare Other | Admitting: Physical Therapy

## 2012-06-04 ENCOUNTER — Ambulatory Visit: Payer: Medicare Other | Admitting: Physical Therapy

## 2012-06-09 ENCOUNTER — Ambulatory Visit: Payer: Medicare Other | Admitting: Physical Therapy

## 2012-06-10 ENCOUNTER — Ambulatory Visit: Payer: Medicare Other | Admitting: Physical Therapy

## 2012-06-12 ENCOUNTER — Ambulatory Visit: Payer: Medicare Other | Admitting: Physical Therapy

## 2012-06-16 ENCOUNTER — Ambulatory Visit: Payer: Medicare Other | Admitting: Physical Therapy

## 2012-06-19 ENCOUNTER — Ambulatory Visit: Payer: Medicare Other | Admitting: Physical Therapy

## 2012-06-24 ENCOUNTER — Ambulatory Visit: Payer: Medicare Other | Admitting: Physical Therapy

## 2012-06-26 ENCOUNTER — Ambulatory Visit: Payer: Medicare Other | Admitting: Physical Therapy

## 2012-06-29 ENCOUNTER — Ambulatory Visit: Payer: Medicare Other | Attending: Orthopaedic Surgery | Admitting: Physical Therapy

## 2012-06-29 DIAGNOSIS — M25519 Pain in unspecified shoulder: Secondary | ICD-10-CM | POA: Insufficient documentation

## 2012-06-29 DIAGNOSIS — M545 Low back pain, unspecified: Secondary | ICD-10-CM | POA: Insufficient documentation

## 2012-06-29 DIAGNOSIS — M25559 Pain in unspecified hip: Secondary | ICD-10-CM | POA: Insufficient documentation

## 2012-06-29 DIAGNOSIS — IMO0001 Reserved for inherently not codable concepts without codable children: Secondary | ICD-10-CM | POA: Insufficient documentation

## 2012-07-01 ENCOUNTER — Ambulatory Visit: Payer: Medicare Other | Admitting: Physical Therapy

## 2012-07-06 ENCOUNTER — Ambulatory Visit: Payer: Medicare Other | Admitting: Physical Therapy

## 2012-07-08 ENCOUNTER — Ambulatory Visit: Payer: Medicare Other | Admitting: Physical Therapy

## 2012-07-13 ENCOUNTER — Ambulatory Visit: Payer: Medicare Other | Admitting: Physical Therapy

## 2012-07-16 ENCOUNTER — Ambulatory Visit: Payer: Medicare Other | Admitting: Physical Therapy

## 2012-07-20 ENCOUNTER — Ambulatory Visit: Payer: Medicare Other | Admitting: Physical Therapy

## 2012-07-22 ENCOUNTER — Ambulatory Visit: Payer: Medicare Other | Admitting: Physical Therapy

## 2012-07-28 ENCOUNTER — Ambulatory Visit (HOSPITAL_COMMUNITY)
Admission: RE | Admit: 2012-07-28 | Discharge: 2012-07-28 | Disposition: A | Discharge status: Home / Self Care | Payer: Medicare Other | Source: Ambulatory Visit | Attending: Internal Medicine | Admitting: Internal Medicine

## 2012-07-28 ENCOUNTER — Encounter (HOSPITAL_COMMUNITY): Payer: Self-pay

## 2012-07-28 DIAGNOSIS — M810 Age-related osteoporosis without current pathological fracture: Secondary | ICD-10-CM | POA: Insufficient documentation

## 2012-07-28 HISTORY — DX: Anxiety disorder, unspecified: F41.9

## 2012-07-28 HISTORY — DX: Major depressive disorder, single episode, unspecified: F32.9

## 2012-07-28 HISTORY — DX: Essential (primary) hypertension: I10

## 2012-07-28 HISTORY — DX: Angina pectoris, unspecified: I20.9

## 2012-07-28 HISTORY — DX: Unspecified osteoarthritis, unspecified site: M19.90

## 2012-07-28 HISTORY — DX: Depression, unspecified: F32.A

## 2012-07-28 HISTORY — DX: Gastro-esophageal reflux disease without esophagitis: K21.9

## 2012-07-28 HISTORY — DX: Hypothyroidism, unspecified: E03.9

## 2012-07-28 MED ORDER — ZOLEDRONIC ACID 5 MG/100ML IV SOLN
5.0000 mg | Freq: Once | INTRAVENOUS | Status: AC
  Administered 2012-07-28: 5 mg via INTRAVENOUS
  Filled 2012-07-28: qty 100

## 2012-07-28 MED ORDER — SODIUM CHLORIDE 0.9 % IV SOLN
Freq: Once | INTRAVENOUS | Status: AC
  Administered 2012-07-28: 16:00:00 via INTRAVENOUS

## 2014-10-20 ENCOUNTER — Other Ambulatory Visit: Payer: Self-pay | Admitting: Internal Medicine

## 2014-10-20 DIAGNOSIS — Z1231 Encounter for screening mammogram for malignant neoplasm of breast: Secondary | ICD-10-CM

## 2015-05-01 ENCOUNTER — Emergency Department (HOSPITAL_COMMUNITY)
Admission: EM | Admit: 2015-05-01 | Discharge: 2015-05-02 | Disposition: A | Discharge status: Home / Self Care | Payer: Medicare Other | Attending: Emergency Medicine | Admitting: Emergency Medicine

## 2015-05-01 ENCOUNTER — Encounter (HOSPITAL_COMMUNITY): Payer: Self-pay | Admitting: *Deleted

## 2015-05-01 DIAGNOSIS — Z79899 Other long term (current) drug therapy: Secondary | ICD-10-CM | POA: Diagnosis not present

## 2015-05-01 DIAGNOSIS — Z7982 Long term (current) use of aspirin: Secondary | ICD-10-CM | POA: Diagnosis not present

## 2015-05-01 DIAGNOSIS — Z951 Presence of aortocoronary bypass graft: Secondary | ICD-10-CM | POA: Insufficient documentation

## 2015-05-01 DIAGNOSIS — R001 Bradycardia, unspecified: Secondary | ICD-10-CM | POA: Diagnosis not present

## 2015-05-01 DIAGNOSIS — F329 Major depressive disorder, single episode, unspecified: Secondary | ICD-10-CM | POA: Diagnosis not present

## 2015-05-01 DIAGNOSIS — E039 Hypothyroidism, unspecified: Secondary | ICD-10-CM | POA: Diagnosis not present

## 2015-05-01 DIAGNOSIS — M1990 Unspecified osteoarthritis, unspecified site: Secondary | ICD-10-CM | POA: Insufficient documentation

## 2015-05-01 DIAGNOSIS — R42 Dizziness and giddiness: Secondary | ICD-10-CM | POA: Diagnosis present

## 2015-05-01 DIAGNOSIS — K219 Gastro-esophageal reflux disease without esophagitis: Secondary | ICD-10-CM | POA: Diagnosis not present

## 2015-05-01 DIAGNOSIS — F419 Anxiety disorder, unspecified: Secondary | ICD-10-CM | POA: Diagnosis not present

## 2015-05-01 DIAGNOSIS — I10 Essential (primary) hypertension: Secondary | ICD-10-CM | POA: Insufficient documentation

## 2015-05-01 LAB — CBC WITH DIFFERENTIAL/PLATELET
Basophils Absolute: 0.1 10*3/uL (ref 0.0–0.1)
Basophils Relative: 1 %
EOS ABS: 0.4 10*3/uL (ref 0.0–0.7)
Eosinophils Relative: 4 %
HCT: 40.4 % (ref 36.0–46.0)
HEMOGLOBIN: 13.4 g/dL (ref 12.0–15.0)
LYMPHS ABS: 2.8 10*3/uL (ref 0.7–4.0)
Lymphocytes Relative: 29 %
MCH: 28.2 pg (ref 26.0–34.0)
MCHC: 33.2 g/dL (ref 30.0–36.0)
MCV: 84.9 fL (ref 78.0–100.0)
MONO ABS: 1.2 10*3/uL — AB (ref 0.1–1.0)
MONOS PCT: 13 %
NEUTROS PCT: 53 %
Neutro Abs: 5.3 10*3/uL (ref 1.7–7.7)
Platelets: 286 10*3/uL (ref 150–400)
RBC: 4.76 MIL/uL (ref 3.87–5.11)
RDW: 14.6 % (ref 11.5–15.5)
WBC: 9.7 10*3/uL (ref 4.0–10.5)

## 2015-05-01 LAB — BASIC METABOLIC PANEL
Anion gap: 9 (ref 5–15)
BUN: 16 mg/dL (ref 6–20)
CALCIUM: 9.4 mg/dL (ref 8.9–10.3)
CHLORIDE: 104 mmol/L (ref 101–111)
CO2: 26 mmol/L (ref 22–32)
CREATININE: 0.85 mg/dL (ref 0.44–1.00)
GFR calc Af Amer: >60 mL/min (ref >60)
GFR calc non Af Amer: 59 mL/min — ABNORMAL LOW (ref >60)
GLUCOSE: 91 mg/dL (ref 65–99)
Potassium: 4.4 mmol/L (ref 3.5–5.1)
SODIUM: 139 mmol/L (ref 135–145)

## 2015-05-01 MED ORDER — CLONIDINE HCL 0.1 MG PO TABS
0.1000 mg | ORAL_TABLET | Freq: Once | ORAL | Status: AC
  Administered 2015-05-01: 0.1 mg via ORAL
  Filled 2015-05-01: qty 1

## 2015-05-01 MED ORDER — MECLIZINE HCL 25 MG PO TABS
12.5000 mg | ORAL_TABLET | Freq: Once | ORAL | Status: AC
  Administered 2015-05-01: 12.5 mg via ORAL
  Filled 2015-05-01: qty 1

## 2015-05-01 NOTE — ED Notes (Addendum)
Per EMS, pt woke this morning with dizziness, worse with standing. No recent change in medications. Pt denies n/v blurred vision, chest pain, urinary symptoms. Pt states she had vertigo ~20 years ago. Pt states she pulled an object, possibly an insect, out of her right ear 2 days ago. Pt denies drainage/pain from ears. 12 lead unremarkable for EMS.

## 2015-05-01 NOTE — ED Notes (Signed)
Bed: WA08 Expected date:  Expected time:  Means of arrival:  Comments: [id number] dizziness

## 2015-05-01 NOTE — ED Provider Notes (Signed)
CSN: [id number]     Arrival date & time 05/01/15  2208 History   First MD Initiated Contact with Patient 05/01/15 2306     Chief Complaint  Patient presents with  . Dizziness     (Consider location/radiation/quality/duration/timing/severity/associated sxs/prior Treatment) HPI Comments: Had an insect in her right ear a week ago and still has some ear pain H/o vertigo and this is similar Denies focal neuro deficits  Patient is a 82 y.o. female presenting with dizziness. The history is provided by the patient and a relative.  Dizziness Quality:  Room spinning Severity:  Moderate Onset quality:  Sudden Duration:  1 day Timing:  Constant Progression:  Unchanged Chronicity:  Recurrent Context: head movement   Context: not with medication   Relieved by:  Being still Worsened by:  Standing up and turning head Ineffective treatments:  None tried Associated symptoms: no blood in stool, no chest pain, no diarrhea, no headaches, no syncope, no tinnitus and no weakness     Past Medical History  Diagnosis Date  . Anginal pain (HCC)   . Hypertension   . Hypothyroidism   . Anxiety   . Depression   . GERD (gastroesophageal reflux disease)   . Arthritis    Past Surgical History  Procedure Laterality Date  . Ovarian cyst removal    . Appendectomy      with ovarian cyst removal  . Coronary artery bypass graft    . Colonoscopy    . Upper gi endoscopy    . Eye surgery      B/L cataract   No family history on file. Social History  Substance Use Topics  . Smoking status: Never Smoker   . Smokeless tobacco: None  . Alcohol Use: No   OB History    No data available     Review of Systems  HENT: Negative for tinnitus.   Cardiovascular: Negative for chest pain and syncope.  Gastrointestinal: Negative for diarrhea and blood in stool.  Neurological: Positive for dizziness. Negative for weakness and headaches.  All other systems reviewed and are negative.     Allergies   Ciprofloxacin; Codeine; and Quinine  Home Medications   Prior to Admission medications   Medication Sig Start Date End Date Taking? Authorizing Provider  amLODipine (NORVASC) 2.5 MG tablet Take 2.5 mg by mouth daily. 04/17/15  Yes Historical Provider, MD  aspirin EC 81 MG tablet Take 81 mg by mouth daily.   Yes Historical Provider, MD  Calcium Carbonate Antacid (ANTACID PO) Take 1 tablet by mouth daily.   Yes Historical Provider, MD  cholecalciferol (VITAMIN D) 1000 UNITS tablet Take 1,000 Units by mouth daily.   Yes Historical Provider, MD  escitalopram (LEXAPRO) 10 MG tablet Take 5 mg by mouth daily. 04/15/15  Yes Historical Provider, MD  ezetimibe (ZETIA) 10 MG tablet Take 5 mg by mouth daily. 03/28/15  Yes Historical Provider, MD  levothyroxine (SYNTHROID, LEVOTHROID) 200 MCG tablet Take 200 mcg by mouth daily. 04/04/15  Yes Historical Provider, MD  metoprolol tartrate (LOPRESSOR) 25 MG tablet Take 25 mg by mouth daily. 04/04/15  Yes Historical Provider, MD  Multiple Vitamins-Minerals (ICAPS AREDS 2) CAPS Take 1 capsule by mouth 2 (two) times daily.   Yes Historical Provider, MD  MYRBETRIQ 25 MG TB24 tablet Take 25 mg by mouth daily. 04/04/15  Yes Historical Provider, MD  rosuvastatin (CRESTOR) 20 MG tablet Take 20 mg by mouth daily. 04/15/15  Yes Historical Provider, MD   BP 163/66 mmHg  Pulse 46  Temp(Src) 97.5 F (36.4 C) (Oral)  Resp 18  SpO2 97% Physical Exam  Constitutional: She is oriented to person, place, and time. She appears well-developed and well-nourished.  Non-toxic appearance. No distress.  HENT:  Head: Normocephalic and atraumatic.  Eyes: Conjunctivae, EOM and lids are normal. Pupils are equal, round, and reactive to light.  Neck: Normal range of motion. Neck supple. No tracheal deviation present. No thyroid mass present.  Cardiovascular: Regular rhythm and normal heart sounds.  Bradycardia present.  Exam reveals no gallop.   No murmur heard. Pulmonary/Chest: Effort  normal and breath sounds normal. No stridor. No respiratory distress. She has no decreased breath sounds. She has no wheezes. She has no rhonchi. She has no rales.  Abdominal: Soft. Normal appearance and bowel sounds are normal. She exhibits no distension. There is no tenderness. There is no rebound and no CVA tenderness.  Musculoskeletal: Normal range of motion. She exhibits no edema or tenderness.  Neurological: She is alert and oriented to person, place, and time. She has normal strength. No cranial nerve deficit or sensory deficit. Coordination normal. GCS eye subscore is 4. GCS verbal subscore is 5. GCS motor subscore is 6.  Horizontal nystagmus noted  Skin: Skin is warm and dry. No abrasion and no rash noted.  Psychiatric: She has a normal mood and affect. Her speech is normal and behavior is normal.  Nursing note and vitals reviewed.   ED Course  Procedures (including critical care time) Labs Review Labs Reviewed  CBC WITH DIFFERENTIAL/PLATELET  BASIC METABOLIC PANEL    Imaging Review No results found. I have personally reviewed and evaluated these images and lab results as part of my medical decision-making.   EKG Interpretation   Date/Time:  Monday May 01 2015 22:22:55 EDT Ventricular Rate:  48 PR Interval:  154 QRS Duration: 95 QT Interval:  497 QTC Calculation: 444 R Axis:   77 Text Interpretation:  Sinus bradycardia RSR' in V1 or V2, probably normal  variant Confirmed by Chelly Dombeck  MD, Hether Anselmo ([id number]) on 05/01/2015 11:07:37 PM      MDM   Final diagnoses:  None    Pt given meds for htn and vertigo and feels better She is able to ambulate w/o ataxia Suspect peripheral vertigo and doubt central vertigo   Lorre NickAnthony Kofi Murrell, MD 05/02/15 [phone]

## 2015-05-02 MED ORDER — MECLIZINE HCL 12.5 MG PO TABS: 12.5000 mg | ORAL_TABLET | Freq: Two times a day (BID) | ORAL | Status: DC | PRN

## 2015-05-02 NOTE — ED Notes (Signed)
Ambulating patient in the hallway. Pt reports she is a still dizzy but it has improved some. Pt was able to ambulate without holding on rail and staff.

## 2015-05-02 NOTE — Discharge Instructions (Signed)
Follow up with Dr. PeWaynard Edwardsrini this week for him to possibly adjust your blood pressure medication Return here for being off balance, severe headache, weakness on one side of your body, or any other problems Benign Positional Vertigo Vertigo is the feeling that you or your surroundings are moving when they are not. Benign positional vertigo is the most common form of vertigo. The cause of this condition is not serious (is benign). This condition is triggered by certain movements and positions (is positional). This condition can be dangerous if it occurs while you are doing something that could endanger you or others, such as driving.  CAUSES In many cases, the cause of this condition is not known. It may be caused by a disturbance in an area of the inner ear that helps your brain to sense movement and balance. This disturbance can be caused by a viral infection (labyrinthitis), head injury, or repetitive motion. RISK FACTORS This condition is more likely to develop in:  Women.  People who are 80 years of age or older. SYMPTOMS Symptoms of this condition usually happen when you move your head or your eyes in different directions. Symptoms may start suddenly, and they usually last for less than a minute. Symptoms may include:  Loss of balance and falling.  Feeling like you are spinning or moving.  Feeling like your surroundings are spinning or moving.  Nausea and vomiting.  Blurred vision.  Dizziness.  Involuntary eye movement (nystagmus). Symptoms can be mild and cause only slight annoyance, or they can be severe and interfere with daily life. Episodes of benign positional vertigo may return (recur) over time, and they may be triggered by certain movements. Symptoms may improve over time. DIAGNOSIS This condition is usually diagnosed by medical history and a physical exam of the head, neck, and ears. You may be referred to a health care provider who specializes in ear, nose, and throat (ENT)  problems (otolaryngologist) or a provider who specializes in disorders of the nervous system (neurologist). You may have additional testing, including:  MRI.  A CT scan.  Eye movement tests. Your health care provider may ask you to change positions quickly while he or she watches you for symptoms of benign positional vertigo, such as nystagmus. Eye movement may be tested with an electronystagmogram (ENG), caloric stimulation, the Dix-Hallpike test, or the roll test.  An electroencephalogram (EEG). This records electrical activity in your brain.  Hearing tests. TREATMENT Usually, your health care provider will treat this by moving your head in specific positions to adjust your inner ear back to normal. Surgery may be needed in severe cases, but this is rare. In some cases, benign positional vertigo may resolve on its own in 2-4 weeks. HOME CARE INSTRUCTIONS Safety  Move slowly.Avoid sudden body or head movements.  Avoid driving.  Avoid operating heavy machinery.  Avoid doing any tasks that would be dangerous to you or others if a vertigo episode would occur.  If you have trouble walking or keeping your balance, try using a cane for stability. If you feel dizzy or unstable, sit down right away.  Return to your normal activities as told by your health care provider. Ask your health care provider what activities are safe for you. General Instructions  Take over-the-counter and prescription medicines only as told by your health care provider.  Avoid certain positions or movements as told by your health care provider.  Drink enough fluid to keep your urine clear or pale yellow.  Keep all follow-up  visits as told by your health care provider. This is important. SEEK MEDICAL CARE IF:  You have a fever.  Your condition gets worse or you develop new symptoms.  Your family or friends notice any behavioral changes.  Your nausea or vomiting gets worse.  You have numbness or a "pins  and needles" sensation. SEEK IMMEDIATE MEDICAL CARE IF:  You have difficulty speaking or moving.  You are always dizzy.  You faint.  You develop severe headaches.  You have weakness in your legs or arms.  You have changes in your hearing or vision.  You develop a stiff neck.  You develop sensitivity to light.   This information is not intended to replace advice given to you by your health care provider. Make sure you discuss any questions you have with your health care provider.   Document Released: 10/22/2005 Document Revised: 10/05/2014 Document Reviewed: 05/09/2014 Elsevier Interactive Patient Education 2016 [employer]. Hypertension Hypertension, commonly called high blood pressure, is when the force of blood pumping through your arteries is too strong. Your arteries are the blood vessels that carry blood from your heart throughout your body. A blood pressure reading consists of a higher number over a lower number, such as 110/72. The higher number (systolic) is the pressure inside your arteries when your heart pumps. The lower number (diastolic) is the pressure inside your arteries when your heart relaxes. Ideally you want your blood pressure below 120/80. Hypertension forces your heart to work harder to pump blood. Your arteries may become narrow or stiff. Having untreated or uncontrolled hypertension can cause heart attack, stroke, kidney disease, and other problems. RISK FACTORS Some risk factors for high blood pressure are controllable. Others are not.  Risk factors you cannot control include:   Race. You may be at higher risk if you are African American.  Age. Risk increases with age.  Gender. Men are at higher risk than women before age 44 years. After age 72, women are at higher risk than men. Risk factors you can control include:  Not getting enough exercise or physical activity.  Being overweight.  Getting too much fat, sugar, calories, or salt in your  diet.  Drinking too much alcohol. SIGNS AND SYMPTOMS Hypertension does not usually cause signs or symptoms. Extremely high blood pressure (hypertensive crisis) may cause headache, anxiety, shortness of breath, and nosebleed. DIAGNOSIS To check if you have hypertension, your health care provider will measure your blood pressure while you are seated, with your arm held at the level of your heart. It should be measured at least twice using the same arm. Certain conditions can cause a difference in blood pressure between your right and left arms. A blood pressure reading that is higher than normal on one occasion does not mean that you need treatment. If it is not clear whether you have high blood pressure, you may be asked to return on a different day to have your blood pressure checked again. Or, you may be asked to monitor your blood pressure at home for 1 or more weeks. TREATMENT Treating high blood pressure includes making lifestyle changes and possibly taking medicine. Living a healthy lifestyle can help lower high blood pressure. You may need to change some of your habits. Lifestyle changes may include:  Following the DASH diet. This diet is high in fruits, vegetables, and whole grains. It is low in salt, red meat, and added sugars.  Keep your sodium intake below 2,300 mg per day.  Getting at least  30-45 minutes of aerobic exercise at least 4 times per week.  Losing weight if necessary.  Not smoking.  Limiting alcoholic beverages.  Learning ways to reduce stress. Your health care provider may prescribe medicine if lifestyle changes are not enough to get your blood pressure under control, and if one of the following is true:  You are 1-44 years of age and your systolic blood pressure is above 140.  You are 73 years of age or older, and your systolic blood pressure is above 150.  Your diastolic blood pressure is above 90.  You have diabetes, and your systolic blood pressure is over  140 or your diastolic blood pressure is over 90.  You have kidney disease and your blood pressure is above 140/90.  You have heart disease and your blood pressure is above 140/90. Your personal target blood pressure may vary depending on your medical conditions, your age, and other factors. HOME CARE INSTRUCTIONS  Have your blood pressure rechecked as directed by your health care provider.   Take medicines only as directed by your health care provider. Follow the directions carefully. Blood pressure medicines must be taken as prescribed. The medicine does not work as well when you skip doses. Skipping doses also puts you at risk for problems.  Do not smoke.   Monitor your blood pressure at home as directed by your health care provider. SEEK MEDICAL CARE IF:   You think you are having a reaction to medicines taken.  You have recurrent headaches or feel dizzy.  You have swelling in your ankles.  You have trouble with your vision. SEEK IMMEDIATE MEDICAL CARE IF:  You develop a severe headache or confusion.  You have unusual weakness, numbness, or feel faint.  You have severe chest or abdominal pain.  You vomit repeatedly.  You have trouble breathing. MAKE SURE YOU:   Understand these instructions.  Will watch your condition.  Will get help right away if you are not doing well or get worse.   This information is not intended to replace advice given to you by your health care provider. Make sure you discuss any questions you have with your health care provider.   Document Released: 01/14/2005 Document Revised: 05/31/2014 Document Reviewed: 11/06/2012 Elsevier Interactive Patient Education [employer]

## 2015-09-16 ENCOUNTER — Emergency Department (HOSPITAL_COMMUNITY): Payer: Medicare Other

## 2015-09-16 ENCOUNTER — Emergency Department (HOSPITAL_COMMUNITY)
Admission: EM | Admit: 2015-09-16 | Discharge: 2015-09-16 | Disposition: A | Discharge status: Home / Self Care | Payer: Medicare Other | Attending: Emergency Medicine | Admitting: Emergency Medicine

## 2015-09-16 ENCOUNTER — Encounter (HOSPITAL_COMMUNITY): Payer: Self-pay | Admitting: Emergency Medicine

## 2015-09-16 DIAGNOSIS — W19XXXA Unspecified fall, initial encounter: Secondary | ICD-10-CM

## 2015-09-16 DIAGNOSIS — Z951 Presence of aortocoronary bypass graft: Secondary | ICD-10-CM | POA: Diagnosis not present

## 2015-09-16 DIAGNOSIS — Z7982 Long term (current) use of aspirin: Secondary | ICD-10-CM | POA: Diagnosis not present

## 2015-09-16 DIAGNOSIS — I2510 Atherosclerotic heart disease of native coronary artery without angina pectoris: Secondary | ICD-10-CM | POA: Insufficient documentation

## 2015-09-16 DIAGNOSIS — Z23 Encounter for immunization: Secondary | ICD-10-CM | POA: Diagnosis not present

## 2015-09-16 DIAGNOSIS — Y9389 Activity, other specified: Secondary | ICD-10-CM | POA: Diagnosis not present

## 2015-09-16 DIAGNOSIS — S0181XA Laceration without foreign body of other part of head, initial encounter: Secondary | ICD-10-CM | POA: Insufficient documentation

## 2015-09-16 DIAGNOSIS — S0990XA Unspecified injury of head, initial encounter: Secondary | ICD-10-CM | POA: Diagnosis present

## 2015-09-16 DIAGNOSIS — I10 Essential (primary) hypertension: Secondary | ICD-10-CM | POA: Insufficient documentation

## 2015-09-16 DIAGNOSIS — Y999 Unspecified external cause status: Secondary | ICD-10-CM | POA: Diagnosis not present

## 2015-09-16 DIAGNOSIS — E039 Hypothyroidism, unspecified: Secondary | ICD-10-CM | POA: Insufficient documentation

## 2015-09-16 DIAGNOSIS — W1809XA Striking against other object with subsequent fall, initial encounter: Secondary | ICD-10-CM | POA: Diagnosis not present

## 2015-09-16 DIAGNOSIS — Z79899 Other long term (current) drug therapy: Secondary | ICD-10-CM | POA: Diagnosis not present

## 2015-09-16 DIAGNOSIS — Y929 Unspecified place or not applicable: Secondary | ICD-10-CM | POA: Insufficient documentation

## 2015-09-16 MED ORDER — BACITRACIN ZINC 500 UNIT/GM EX OINT
TOPICAL_OINTMENT | CUTANEOUS | Status: AC
  Administered 2015-09-16: 1
  Filled 2015-09-16: qty 0.9

## 2015-09-16 MED ORDER — TETANUS-DIPHTH-ACELL PERTUSSIS 5-2.5-18.5 LF-MCG/0.5 IM SUSP
0.5000 mL | Freq: Once | INTRAMUSCULAR | Status: AC
  Administered 2015-09-16: 0.5 mL via INTRAMUSCULAR
  Filled 2015-09-16: qty 0.5

## 2015-09-16 MED ORDER — LIDOCAINE-EPINEPHRINE 2 %-1:100000 IJ SOLN
10.0000 mL | Freq: Once | INTRAMUSCULAR | Status: AC
  Administered 2015-09-16: 5 mL
  Filled 2015-09-16: qty 1

## 2015-09-16 NOTE — ED Triage Notes (Signed)
Per [facility] pt from [facility] , pt fell today while assisting husband while he was falling. Landed forward hit chin on chair. Presents with laceration , bleeding controlled . No anticoagulant intake. Denies loc . No further injury.

## 2015-09-16 NOTE — ED Provider Notes (Signed)
[facility]-EMERGENCY DEPT Provider Note   CSN: [id number] Arrival date & time: 09/16/15  1716     History   Chief Complaint Chief Complaint  Patient presents with  . Fall    chin lac    HPI Wendy Wiley is a 82 y.o. female.  Patient presents today after a fall that occurred just prior to arrival.  She states that she was helping her husband stand up when he lost his balance and fell to the ground.  He was holding on to her when he fell, which caused her to fall.  She states that she hit the right side of her lower face on a dresser when she fell.  She sustained a laceration to the right lower face when she fell. She has applied pressure to the laceration, which has resolved the bleeding.   She denies LOC.  Denies vision changes, nausea, vomiting, extremity pain, neck pain, back pain, or any other symptoms at this time.  She is unsure of the date of her last tetanus.  She takes a baby ASA daily, but no other blood thinner.      Past Medical History:  Diagnosis Date  . Anginal pain (HCC)   . Anxiety   . Arthritis   . Depression   . GERD (gastroesophageal reflux disease)   . Hypertension   . Hypothyroidism     Patient Active Problem List   Diagnosis Date Noted  . GASTRITIS 07/28/2009  . DYSPHONIA, SPASTIC 07/28/2009  . DIARRHEA 07/28/2009  . CAD 05/06/2007  . OTHER DISEASES OF LARYNX 05/06/2007  . GERD 05/06/2007  . DYSPEPSIA 05/06/2007  . DIVERTICULOSIS, MILD 05/06/2007  . HEARTBURN 05/06/2007    Past Surgical History:  Procedure Laterality Date  . APPENDECTOMY     with ovarian cyst removal  . COLONOSCOPY    . CORONARY ARTERY BYPASS GRAFT    . EYE SURGERY     B/L cataract  . OVARIAN CYST REMOVAL    . UPPER GI ENDOSCOPY      OB History    No data available       Home Medications    Prior to Admission medications   Medication Sig Start Date End Date Taking? Authorizing Provider  amLODipine (NORVASC) 2.5 MG tablet Take 2.5 mg by mouth daily. 04/17/15    Historical Provider, MD  aspirin EC 81 MG tablet Take 81 mg by mouth daily.    Historical Provider, MD  Calcium Carbonate Antacid (ANTACID PO) Take 1 tablet by mouth daily.    Historical Provider, MD  cholecalciferol (VITAMIN D) 1000 UNITS tablet Take 1,000 Units by mouth daily.    Historical Provider, MD  escitalopram (LEXAPRO) 10 MG tablet Take 5 mg by mouth daily. 04/15/15   Historical Provider, MD  ezetimibe (ZETIA) 10 MG tablet Take 5 mg by mouth daily. 03/28/15   Historical Provider, MD  levothyroxine (SYNTHROID, LEVOTHROID) 200 MCG tablet Take 200 mcg by mouth daily. 04/04/15   Historical Provider, MD  meclizine (ANTIVERT) 12.5 MG tablet Take 1 tablet (12.5 mg total) by mouth 2 (two) times daily as needed for dizziness. 05/02/15   Lorre NickAnthony Allen, MD  metoprolol tartrate (LOPRESSOR) 25 MG tablet Take 25 mg by mouth daily. 04/04/15   Historical Provider, MD  Multiple Vitamins-Minerals (ICAPS AREDS 2) CAPS Take 1 capsule by mouth 2 (two) times daily.    Historical Provider, MD  MYRBETRIQ 25 MG TB24 tablet Take 25 mg by mouth daily. 04/04/15   Historical Provider, MD  rosuvastatin (  CRESTOR) 20 MG tablet Take 20 mg by mouth daily. 04/15/15   Historical Provider, MD    Family History No family history on file.  Social History Social History  Substance Use Topics  . Smoking status: Never Smoker  . Smokeless tobacco: Never Used  . Alcohol use No     Allergies   Ciprofloxacin; Codeine; and Quinine   Review of Systems Review of Systems  All other systems reviewed and are negative.    Physical Exam Updated Vital Signs BP 160/57 (BP Location: Left Arm)   Pulse (!) 53   Temp 97.7 F (36.5 C) (Oral)   Resp 20   SpO2 97%   Physical Exam  Constitutional: She is oriented to person, place, and time. She appears well-developed and well-nourished.  HENT:  Head: Normocephalic.    No trismus No tenderness to palpation of the mandible. No malocclusion   Eyes: EOM are normal. Pupils are  equal, round, and reactive to light.  Neck: Normal range of motion. Neck supple.  Cardiovascular: Normal rate, regular rhythm and normal heart sounds.   Pulmonary/Chest: Effort normal and breath sounds normal.  Musculoskeletal: Normal range of motion.  Full ROM of all extremities without pain No pain of the cervical, thoracic, or lumbar spine.  No step offs or deformities  Neurological: She is alert and oriented to person, place, and time. She has normal strength. No cranial nerve deficit or sensory deficit.  Skin: Skin is warm and dry.  Psychiatric: She has a normal mood and affect.  Nursing note and vitals reviewed.    ED Treatments / Results  Labs (all labs ordered are listed, but only abnormal results are displayed) Labs Reviewed - No data to display  EKG  EKG Interpretation None       Radiology No results found.  Procedures Procedures (including critical care time)  Medications Ordered in ED Medications  Tdap (BOOSTRIX) injection 0.5 mL (not administered)     Initial Impression / Assessment and Plan / ED Course  I have reviewed the triage vital signs and the nursing notes.  Pertinent labs & imaging results that were available during my care of the patient were reviewed by me and considered in my medical decision making (see chart for details).  Clinical Course   LACERATION REPAIR Performed by: Santiago Glad Authorized by: Santiago Glad Consent: Verbal consent obtained. Risks and benefits: risks, benefits and alternatives were discussed Consent given by: patient Patient identity confirmed: provided demographic data Prepped and Draped in normal sterile fashion Wound explored  Laceration Location: right lower face  Laceration Length: 3.5 cm  No Foreign Bodies seen or palpated  Anesthesia: local infiltration  Local anesthetic: lidocaine 2% with epinephrine  Anesthetic total: 3 ml  Irrigation method: syringe Amount of cleaning:  standard  Skin closure: 5-0 Prolene  Number of sutures: 7  Technique: simple interrupted  Patient tolerance: Patient tolerated the procedure well with no immediate complications.  Final Clinical Impressions(s) / ED Diagnoses   Final diagnoses:  None   Patient presents today after a mechanical fall.  She denies LOC.  She has full ROM of extremities and is ambulatory.  CT head and cervical spine negative for acute findings.  Laceration repaired in the ED without difficulty.  Tetanus updated.  Feel that the patient is stable for discharge.  Return precautions given. . Patient also evaluated by Dr Eudelia Bunch who is in agreement with the plan.  Return precautions given. New Prescriptions New Prescriptions   No medications on  file     Santiago GladHeather Dayana Dalporto, PA-C 09/19/15 2139    Nira ConnPedro Eduardo Cardama, MD 09/20/15 1032

## 2016-07-09 ENCOUNTER — Ambulatory Visit (HOSPITAL_COMMUNITY)
Admission: RE | Admit: 2016-07-09 | Discharge: 2016-07-09 | Disposition: A | Discharge status: Home / Self Care | Payer: Medicare Other | Source: Ambulatory Visit | Attending: Internal Medicine | Admitting: Internal Medicine

## 2016-07-09 ENCOUNTER — Encounter (HOSPITAL_COMMUNITY): Payer: Self-pay

## 2016-07-09 DIAGNOSIS — M810 Age-related osteoporosis without current pathological fracture: Secondary | ICD-10-CM | POA: Insufficient documentation

## 2016-07-09 MED ORDER — SODIUM CHLORIDE 0.9 % IV SOLN
INTRAVENOUS | Status: DC
  Administered 2016-07-09: 14:00:00 via INTRAVENOUS

## 2016-07-09 MED ORDER — ZOLEDRONIC ACID 5 MG/100ML IV SOLN
5.0000 mg | Freq: Once | INTRAVENOUS | Status: AC
  Administered 2016-07-09: 5 mg via INTRAVENOUS
  Filled 2016-07-09: qty 100

## 2016-07-09 NOTE — Discharge Instructions (Signed)
Drink  Fluids/water as tolerated over the next 72 hours Tylenol or ibuprofen if needed for aches and pains next few days Continue Calcium and Vit D as directed by your MD  Call 911 for emergencies Call MD for any other problems or questions.     Reclast Zoledronic Acid injection (Paget's Disease, Osteoporosis) What is this medicine? ZOLEDRONIC ACID (ZOE le dron ik AS id) lowers the amount of calcium loss from bone. It is used to treat Paget's disease and osteoporosis in women. This medicine may be used for other purposes; ask your health care provider or pharmacist if you have questions. COMMON BRAND NAME(S): Reclast, Zometa What should I tell my health care provider before I take this medicine? They need to know if you have any of these conditions: -aspirin-sensitive asthma -cancer, especially if you are receiving medicines used to treat cancer -dental disease or wear dentures -infection -kidney disease -low levels of calcium in the blood -past surgery on the parathyroid gland or intestines -receiving corticosteroids like dexamethasone or prednisone -an unusual or allergic reaction to zoledronic acid, other medicines, foods, dyes, or preservatives -pregnant or trying to get pregnant -breast-feeding How should I use this medicine? This medicine is for infusion into a vein. It is given by a health care professional in a hospital or clinic setting. Talk to your pediatrician regarding the use of this medicine in children. This medicine is not approved for use in children. Overdosage: If you think you have taken too much of this medicine contact a poison control center or emergency room at once. NOTE: This medicine is only for you. Do not share this medicine with others. What if I miss a dose? It is important not to miss your dose. Call your doctor or health care professional if you are unable to keep an appointment. What may interact with this medicine? -certain antibiotics  given by injection -NSAIDs, medicines for pain and inflammation, like ibuprofen or naproxen -some diuretics like bumetanide, furosemide -teriparatide This list may not describe all possible interactions. Give your health care provider a list of all the medicines, herbs, non-prescription drugs, or dietary supplements you use. Also tell them if you smoke, drink alcohol, or use illegal drugs. Some items may interact with your medicine. What should I watch for while using this medicine? Visit your doctor or health care professional for regular checkups. It may be some time before you see the benefit from this medicine. Do not stop taking your medicine unless your doctor tells you to. Your doctor may order blood tests or other tests to see how you are doing. Women should inform their doctor if they wish to become pregnant or think they might be pregnant. There is a potential for serious side effects to an unborn child. Talk to your health care professional or pharmacist for more information. You should make sure that you get enough calcium and vitamin D while you are taking this medicine. Discuss the foods you eat and the vitamins you take with your health care professional. Some people who take this medicine have severe bone, joint, and/or muscle pain. This medicine may also increase your risk for jaw problems or a broken thigh bone. Tell your doctor right away if you have severe pain in your jaw, bones, joints, or muscles. Tell your doctor if you have any pain that does not go away or that gets worse. Tell your dentist and dental surgeon that you are taking this medicine. You should not have  major dental surgery while on this medicine. See your dentist to have a dental exam and fix any dental problems before starting this medicine. Take good care of your teeth while on this medicine. Make sure you see your dentist for regular follow-up appointments. What side effects may I notice from receiving this  medicine? Side effects that you should report to your doctor or health care professional as soon as possible: -allergic reactions like skin rash, itching or hives, swelling of the face, lips, or tongue -anxiety, confusion, or depression -breathing problems -changes in vision -eye pain -feeling faint or lightheaded, falls -jaw pain, especially after dental work -mouth sores -muscle cramps, stiffness, or weakness -redness, blistering, peeling or loosening of the skin, including inside the mouth -trouble passing urine or change in the amount of urine Side effects that usually do not require medical attention (report to your doctor or health care professional if they continue or are bothersome): -bone, joint, or muscle pain -constipation -diarrhea -fever -hair loss -irritation at site where injected -loss of appetite -nausea, vomiting -stomach upset -trouble sleeping -trouble swallowing -weak or tired This list may not describe all possible side effects. Call your doctor for medical advice about side effects. You may report side effects to FDA at 1-800-FDA-1088. Where should I keep my medicine? This drug is given in a hospital or clinic and will not be stored at home. NOTE: This sheet is a summary. It may not cover all possible information. If you have questions about this medicine, talk to your doctor, pharmacist, or health care provider.  2018 Elsevier/Gold Standard (2013-06-12 14:19:57)

## 2016-12-27 ENCOUNTER — Emergency Department (HOSPITAL_COMMUNITY)
Admission: EM | Admit: 2016-12-27 | Discharge: 2016-12-28 | Disposition: A | Discharge status: Home / Self Care | Payer: Medicare Other | Attending: Emergency Medicine | Admitting: Emergency Medicine

## 2016-12-27 ENCOUNTER — Emergency Department (HOSPITAL_COMMUNITY): Payer: Medicare Other

## 2016-12-27 ENCOUNTER — Ambulatory Visit (HOSPITAL_COMMUNITY)
Admission: EM | Admit: 2016-12-27 | Discharge: 2016-12-27 | Disposition: A | Discharge status: Home / Self Care | Payer: Medicare Other

## 2016-12-27 ENCOUNTER — Encounter (HOSPITAL_COMMUNITY): Payer: Self-pay | Admitting: *Deleted

## 2016-12-27 ENCOUNTER — Other Ambulatory Visit: Payer: Self-pay

## 2016-12-27 DIAGNOSIS — R079 Chest pain, unspecified: Secondary | ICD-10-CM

## 2016-12-27 DIAGNOSIS — I2510 Atherosclerotic heart disease of native coronary artery without angina pectoris: Secondary | ICD-10-CM | POA: Insufficient documentation

## 2016-12-27 DIAGNOSIS — I10 Essential (primary) hypertension: Secondary | ICD-10-CM | POA: Insufficient documentation

## 2016-12-27 DIAGNOSIS — Z7982 Long term (current) use of aspirin: Secondary | ICD-10-CM | POA: Insufficient documentation

## 2016-12-27 DIAGNOSIS — E039 Hypothyroidism, unspecified: Secondary | ICD-10-CM | POA: Insufficient documentation

## 2016-12-27 DIAGNOSIS — Z951 Presence of aortocoronary bypass graft: Secondary | ICD-10-CM | POA: Insufficient documentation

## 2016-12-27 DIAGNOSIS — Z79899 Other long term (current) drug therapy: Secondary | ICD-10-CM | POA: Diagnosis not present

## 2016-12-27 LAB — BASIC METABOLIC PANEL
ANION GAP: 9 (ref 5–15)
BUN: 27 mg/dL — ABNORMAL HIGH (ref 6–20)
CHLORIDE: 101 mmol/L (ref 101–111)
CO2: 28 mmol/L (ref 22–32)
Calcium: 9.6 mg/dL (ref 8.9–10.3)
Creatinine, Ser: 1.35 mg/dL — ABNORMAL HIGH (ref 0.44–1.00)
GFR calc non Af Amer: 33 mL/min — ABNORMAL LOW (ref >60)
GFR, EST AFRICAN AMERICAN: 38 mL/min — AB (ref >60)
Glucose, Bld: 116 mg/dL — ABNORMAL HIGH (ref 65–99)
Potassium: 3.8 mmol/L (ref 3.5–5.1)
SODIUM: 138 mmol/L (ref 135–145)

## 2016-12-27 LAB — CBC
HCT: 43.8 % (ref 36.0–46.0)
HEMOGLOBIN: 14.2 g/dL (ref 12.0–15.0)
MCH: 27.8 pg (ref 26.0–34.0)
MCHC: 32.4 g/dL (ref 30.0–36.0)
MCV: 85.9 fL (ref 78.0–100.0)
PLATELETS: 204 10*3/uL (ref 150–400)
RBC: 5.1 MIL/uL (ref 3.87–5.11)
RDW: 14.3 % (ref 11.5–15.5)
WBC: 9.6 10*3/uL (ref 4.0–10.5)

## 2016-12-27 LAB — I-STAT TROPONIN, ED: TROPONIN I, POC: 0 ng/mL (ref 0.00–0.08)

## 2016-12-27 NOTE — ED Provider Notes (Signed)
[facility] Provider Note   CSN: [id number] Arrival date & time: 12/27/16  1646     History   Chief Complaint Chief Complaint  Patient presents with  . Chest Pain    HPI Wendy Wiley is a 83 y.o. female.  Patient with a history of CAD, s/p CABG 2008 presents with complaint of chest pain earlier today. She does not remember what she was doing but feels she was at rest when pain started. It was associated with SOB and nausea. No diaphoresis or vomiting. No cough or fever. The episode lasted about 10 minutes and she is currently pain free. Her son voices a concern for progressive DOE over the past several months. She reports her last appointment with her cardiologist was this past week and was a routine appointment.    The history is provided by the patient and a relative. No language interpreter was used.  Chest Pain   Associated symptoms include nausea and shortness of breath. Pertinent negatives include no abdominal pain, no cough, no diaphoresis, no dizziness, no fever, no vomiting and no weakness.    Past Medical History:  Diagnosis Date  . Anginal pain (HCC)   . Anxiety   . Arthritis   . Depression   . GERD (gastroesophageal reflux disease)   . Hypertension   . Hypothyroidism     Patient Active Problem List   Diagnosis Date Noted  . GASTRITIS 07/28/2009  . DYSPHONIA, SPASTIC 07/28/2009  . DIARRHEA 07/28/2009  . CAD 05/06/2007  . OTHER DISEASES OF LARYNX 05/06/2007  . GERD 05/06/2007  . DYSPEPSIA 05/06/2007  . DIVERTICULOSIS, MILD 05/06/2007  . HEARTBURN 05/06/2007    Past Surgical History:  Procedure Laterality Date  . APPENDECTOMY     with ovarian cyst removal  . COLONOSCOPY    . CORONARY ARTERY BYPASS GRAFT    . EYE SURGERY     B/L cataract  . OVARIAN CYST REMOVAL    . UPPER GI ENDOSCOPY      OB History    No data available       Home Medications    Prior to Admission medications   Medication Sig  Start Date End Date Taking? Authorizing Provider  amLODipine (NORVASC) 2.5 MG tablet Take 2.5 mg by mouth daily.   Yes [provider]  aspirin EC 81 MG tablet Take 81 mg by mouth at bedtime.    Yes [provider]  calcium carbonate (OSCAL) 1500 (600 Ca) MG TABS tablet Take 600 mg of elemental calcium by mouth daily with breakfast.    Yes [provider]  escitalopram (LEXAPRO) 5 MG tablet Take 5 mg by mouth at bedtime.   Yes [provider]  ezetimibe (ZETIA) 10 MG tablet Take 10 mg by mouth at bedtime.   Yes [provider]  furosemide (LASIX) 20 MG tablet Take 20 mg by mouth daily. 11/11/16  Yes [provider]  levothyroxine (SYNTHROID, LEVOTHROID) 200 MCG tablet Take 200 mcg by mouth daily before breakfast.    Yes [provider]  meclizine (ANTIVERT) 12.5 MG tablet Take 1 tablet (12.5 mg total) by mouth 2 (two) times daily as needed for dizziness. 05/02/15  Yes Lorre NickAllen, Anthony, MD  metoprolol tartrate (LOPRESSOR) 25 MG tablet Take 25 mg by mouth 2 (two) times daily.    Yes [provider]  Multiple Vitamins-Minerals (PRESERVISION AREDS 2) CAPS Take 1 capsule by mouth 2 (two) times daily.   Yes [provider]  MYRBETRIQ 25 MG TB24 tablet Take 25 mg by mouth daily.   Yes [provider]  potassium chloride (K-DUR,KLOR-CON) 10 MEQ tablet Take 10 mEq by mouth daily. 11/11/16  Yes [provider]  ranitidine (ZANTAC) 150 MG tablet Take 150 mg by mouth as needed. 11/18/16  Yes [provider]  rosuvastatin (CRESTOR) 20 MG tablet Take 20 mg by mouth at bedtime. 10/23/16  Yes [provider]    Family History No family history on file.  Social History Social History   Tobacco Use  . Smoking status: Never Smoker  . Smokeless tobacco: Never Used  Substance Use Topics  . Alcohol use: No  . Drug use: No     Allergies   Ciprofloxacin; Codeine; and Quinine   Review of  Systems Review of Systems  Constitutional: Negative for chills, diaphoresis and fever.  HENT: Negative.   Respiratory: Positive for shortness of breath. Negative for cough.   Cardiovascular: Positive for chest pain.  Gastrointestinal: Positive for nausea. Negative for abdominal pain and vomiting.  Musculoskeletal: Negative.  Negative for myalgias.  Skin: Negative.   Neurological: Negative.  Negative for dizziness, syncope and weakness.  Psychiatric/Behavioral: Negative for confusion.     Physical Exam Updated Vital Signs BP (!) 117/56   Pulse (!) 52   Temp 97.6 F (36.4 C) (Oral)   Resp 12   Ht 4\' 11"  (1.499 m)   Wt 54.4 kg (120 lb)   SpO2 97%   BMI 24.24 kg/m   Physical Exam  Constitutional: She appears well-developed and well-nourished.  HENT:  Head: Normocephalic.  Neck: Normal range of motion. Neck supple.  Cardiovascular: Normal rate and regular rhythm.  No murmur heard. No carotid bruit.  Pulmonary/Chest: Effort normal and breath sounds normal. She has no wheezes. She has no rhonchi. She has no rales.  Abdominal: Soft. Bowel sounds are normal. There is no tenderness. There is no rebound and no guarding.  Musculoskeletal: Normal range of motion.  Neurological: She is alert. No cranial nerve deficit.  Skin: Skin is warm and dry. No rash noted.  Psychiatric: She has a normal mood and affect.     ED Treatments / Results  Labs (all labs ordered are listed, but only abnormal results are displayed) Labs Reviewed  BASIC METABOLIC PANEL - Abnormal; Notable for the following components:      Result Value   Glucose, Bld 116 (*)    BUN 27 (*)    Creatinine, Ser 1.35 (*)    GFR calc non Af Amer 33 (*)    GFR calc Af Amer 38 (*)    All other components within normal limits  CBC  I-STAT TROPONIN, ED  I-STAT TROPONIN, ED   Results for orders placed or performed during the hospital encounter of 12/27/16  Basic metabolic panel  Result Value Ref Range   Sodium 138  135 - 145 mmol/L   Potassium 3.8 3.5 - 5.1 mmol/L   Chloride 101 101 - 111 mmol/L   CO2 28 22 - 32 mmol/L   Glucose, Bld 116 (H) 65 - 99 mg/dL   BUN 27 (H) 6 - 20 mg/dL   Creatinine, Ser 1.61 (H) 0.44 - 1.00 mg/dL   Calcium 9.6 8.9 - 09.6 mg/dL   GFR calc non Af Amer 33 (L) >60 mL/min   GFR calc Af Amer 38 (L) >60 mL/min   Anion gap 9 5 - 15  CBC  Result Value Ref Range   WBC 9.6 4.0 -  10.5 K/uL   RBC 5.10 3.87 - 5.11 MIL/uL   Hemoglobin 14.2 12.0 - 15.0 g/dL   HCT 16.143.8 09.636.0 - 04.546.0 %   MCV 85.9 78.0 - 100.0 fL   MCH 27.8 26.0 - 34.0 pg   MCHC 32.4 30.0 - 36.0 g/dL   RDW 40.914.3 81.111.5 - 91.415.5 %   Platelets 204 150 - 400 K/uL  I-stat troponin, ED  Result Value Ref Range   Troponin i, poc 0.00 0.00 - 0.08 ng/mL   Comment 3             EKG  EKG Interpretation None       Radiology Dg Chest 2 View  Result Date: 12/27/2016 CLINICAL DATA:  Chest pain since this morning. EXAM: CHEST  2 VIEW COMPARISON:  08/16/2009. FINDINGS: Normal sized heart. Stable post CABG changes. The lungs remain mildly hyperexpanded with mild diffuse peribronchial thickening. Atheromatous aortic calcifications. Mild thoracolumbar spine degenerative changes. IMPRESSION: 1. No acute abnormality. 2. Mild changes of COPD and chronic bronchitis. Electronically Signed   By: Beckie SaltsSteven  Reid M.D.   On: 12/27/2016 17:45    Procedures Procedures (including critical care time)  Medications Ordered in ED Medications - No data to display   Initial Impression / Assessment and Plan / ED Course  I have reviewed the triage vital signs and the nursing notes.  Pertinent labs & imaging results that were available during my care of the patient were reviewed by me and considered in my medical decision making (see chart for details).     Patient presents with family concerned about chest pain earlier today. No pain currently. History of CAD.  She is very well appearing and comfortable. Initial lab studies show only an  incidental mild elevation of creatinine. Unchanged EKG, CXR clear. Normal troponin x 1.  She has been ambulatory in the department without DOE or recurrent pain.   She is seen and evaluated by Dr. Ranae PalmsYelverton. Will obtain a delta troponin and, if normal, will discharge home with outpatient follow up.   Final Clinical Impressions(s) / ED Diagnoses   Final diagnoses:  None   1. Nonspecific chest pain  ED Discharge Orders    None       Elpidio AnisUpstill, Grayson White, PA-C 12/28/16 0423    Loren RacerYelverton, David, MD 01/07/17 [id number]

## 2016-12-27 NOTE — ED Triage Notes (Signed)
The pt is c/o chest pain today with nausea  Bypass surgery 20 years ago

## 2016-12-28 LAB — I-STAT TROPONIN, ED: Troponin i, poc: 0.01 ng/mL (ref 0.00–0.08)

## 2016-12-28 NOTE — Discharge Instructions (Signed)
Your lab studies and other tests are normal with regard to your chest pain. You can be discharged home and need to follow up with Dr. Donnie Ahoilley this coming week for recheck.

## 2016-12-31 ENCOUNTER — Encounter: Payer: Self-pay | Admitting: Cardiovascular Disease

## 2016-12-31 ENCOUNTER — Ambulatory Visit: Payer: Medicare Other | Admitting: Cardiovascular Disease

## 2016-12-31 VITALS — BP 150/70 | HR 56 | Ht 59.0 in | Wt 126.0 lb

## 2016-12-31 DIAGNOSIS — R0989 Other specified symptoms and signs involving the circulatory and respiratory systems: Secondary | ICD-10-CM | POA: Diagnosis not present

## 2016-12-31 DIAGNOSIS — E7800 Pure hypercholesterolemia, unspecified: Secondary | ICD-10-CM | POA: Diagnosis not present

## 2016-12-31 DIAGNOSIS — R011 Cardiac murmur, unspecified: Secondary | ICD-10-CM

## 2016-12-31 DIAGNOSIS — I10 Essential (primary) hypertension: Secondary | ICD-10-CM | POA: Insufficient documentation

## 2016-12-31 DIAGNOSIS — R079 Chest pain, unspecified: Secondary | ICD-10-CM

## 2016-12-31 DIAGNOSIS — E785 Hyperlipidemia, unspecified: Secondary | ICD-10-CM | POA: Insufficient documentation

## 2016-12-31 NOTE — Assessment & Plan Note (Signed)
History of essential hypertension with blood pressure measured today at 150/70. She is on amlodipine and metoprolol. Continue current meds at current dosing.

## 2016-12-31 NOTE — Assessment & Plan Note (Signed)
History of CAD status post bypass grafting in [facility] at [facility]. Her cardiologist was Dr. Viann FishSpencer Tilley. She was recently seen in the emergency room on 12/27/16 with chest pain and ruled out for myocardial infarction. She has complained of increasing dyspnea on exertion. She's had recent episodes of nitrate responsive chest pain. She also has an ejection murmur. I'm going to get a 2-D echocardiogram and a pharmacologic stress test to further evaluate.

## 2016-12-31 NOTE — Assessment & Plan Note (Signed)
History of hyperlipidemia on statin therapy and Zetia followed by her PCP 

## 2016-12-31 NOTE — Progress Notes (Signed)
12/31/2016 Wendy Wiley   Jun 16, 1922  [id number]  Primary Physician Rodrigo RanPerini, Mark, MD Primary Cardiologist: Runell GessJonathan J Berry MD FACP, [place]ACC, [place], [place]  HPI:  Wendy Wiley is a 83 y.o.  widowed Caucas she is the mother of 2, grandmother 5 grandchildren whose husband of 71 years passed away in October 2017. She is accompanied by her sister Erlene SentersGail Duncan today. 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 She has a history of treated hypertension and hyperlipidemia. She had bypass graft in 1999. She's had increasing dyspnea on exertion and some nitroglycerin glycerin responsive chest pain recently and was seen in the emergency room 12/27/16. She ruled out for myocardial infarction.  Current Meds  Medication Sig  . amLODipine (NORVASC) 2.5 MG tablet Take 2.5 mg by mouth daily.  Marland Kitchen. aspirin EC 81 MG tablet Take 81 mg by mouth at bedtime.   . calcium carbonate (OSCAL) 1500 (600 Ca) MG TABS tablet Take 600 mg of elemental calcium by mouth daily with breakfast.   . escitalopram (LEXAPRO) 5 MG tablet Take 5 mg by mouth at bedtime.  Marland Kitchen. ezetimibe (ZETIA) 10 MG tablet Take 10 mg by mouth at bedtime.  . furosemide (LASIX) 20 MG tablet Take 20 mg by mouth daily.  Marland Kitchen. levothyroxine (SYNTHROID, LEVOTHROID) 200 MCG tablet Take 200 mcg by mouth daily before breakfast.   . meclizine (ANTIVERT) 12.5 MG tablet Take 1 tablet (12.5 mg total) by mouth 2 (two) times daily as needed for dizziness.  . metoprolol tartrate (LOPRESSOR) 25 MG tablet Take 25 mg by mouth 2 (two) times daily.   . Multiple Vitamins-Minerals (PRESERVISION AREDS 2) CAPS Take 1 capsule by mouth 2 (two) times daily.  Marland Kitchen. MYRBETRIQ 25 MG TB24 tablet Take 25 mg by mouth daily.  . potassium chloride (K-DUR,KLOR-CON) 10 MEQ tablet Take 10 mEq by mouth daily.  . ranitidine (ZANTAC) 150 MG tablet Take 150 mg by mouth as needed.  . rosuvastatin (CRESTOR) 20 MG tablet Take 20 mg by mouth at bedtime.     Allergies  Allergen Reactions  .  Ciprofloxacin Other (See Comments)    Reaction:  Unknown   . Codeine Other (See Comments)    Reaction:  Altered mental status   . Quinine Swelling    Social History   Socioeconomic History  . Marital status: Widowed    Spouse name: Not on file  . Number of children: Not on file  . Years of education: Not on file  . Highest education level: Not on file  Social Needs  . Financial resource strain: Not on file  . Food insecurity - worry: Not on file  . Food insecurity - inability: Not on file  . Transportation needs - medical: Not on file  . Transportation needs - non-medical: Not on file  Occupational History  . Not on file  Tobacco Use  . Smoking status: Never Smoker  . Smokeless tobacco: Never Used  Substance and Sexual Activity  . Alcohol use: No  . Drug use: No  . Sexual activity: Yes    Birth control/protection: Post-menopausal  Other Topics Concern  . Not on file  Social History Narrative  . Not on file     Review of Systems: General: negative for chills, fever, night sweats or weight changes.  Cardiovascular: negative for chest pain, dyspnea on exertion, edema, orthopnea, palpitations, paroxysmal nocturnal dyspnea or shortness of breath Dermatological: negative for rash Respiratory: negative for cough or wheezing Urologic: negative for  hematuria Abdominal: negative for nausea, vomiting, diarrhea, bright red blood per rectum, melena, or hematemesis Neurologic: negative for visual changes, syncope, or dizziness All other systems reviewed and are otherwise negative except as noted above.    Blood pressure (!) 150/70, pulse (!) 56, height 4\' 11"  (1.499 m), weight 126 lb (57.2 kg).  General appearance: alert and no distress Neck: no adenopathy, no JVD, supple, symmetrical, trachea midline, thyroid not enlarged, symmetric, no tenderness/mass/nodules and Bilateral carotid bruits Lungs: clear to auscultation bilaterally Heart: 2/6 systolic ejection murmur at the base  consistent consistent with aortic sclerosis and/or stenosis. Extremities: extremities normal, atraumatic, no cyanosis or edema Pulses: 2+ and symmetric Skin: Skin color, texture, turgor normal. No rashes or lesions Neurologic: Alert and oriented X 3, normal strength and tone. Normal symmetric reflexes. Normal coordination and gait  EKG sinus bradycardia 56 with nonspecific ST and T-wave changes. I personally reviewed this EKG  ASSESSMENT AND PLAN:   CAD History of CAD status post bypass grafting in [facility] at [facility]. Her cardiologist was Dr. Viann FishSpencer Tilley. She was recently seen in the emergency room on 12/27/16 with chest pain and ruled out for myocardial infarction. She has complained of increasing dyspnea on exertion. She's had recent episodes of nitrate responsive chest pain. She also has an ejection murmur. I'm going to get a 2-D echocardiogram and a pharmacologic stress test to further evaluate.  Essential hypertension History of essential hypertension with blood pressure measured today at 150/70. She is on amlodipine and metoprolol. Continue current meds at current dosing.  Hyperlipidemia History of hyperlipidemia on statin therapy and Zetia followed by her PCP      Runell GessJonathan J. Berry MD [facility] HospitalFACP,FACC,FAHA, [facility] City[facility]SCAI 12/31/2016 2:03 PM

## 2016-12-31 NOTE — Patient Instructions (Signed)
Medication Instructions: Your physician recommends that you continue on your current medications as directed. Please refer to the Current Medication list given to you today.   Testing/Procedures: Your physician has requested that you have a lexiscan myoview. For further information please visit [email or web address]. Please follow instruction sheet, as given.  Your physician has requested that you have a carotid duplex. This test is an ultrasound of the carotid arteries in your neck. It looks at blood flow through these arteries that supply the brain with blood. Allow one hour for this exam. There are no restrictions or special instructions.  Your physician has requested that you have an echocardiogram. Echocardiography is a painless test that uses sound waves to create images of your heart. It provides your doctor with information about the size and shape of your heart and how well your heart's chambers and valves are working. This procedure takes approximately one hour. There are no restrictions for this procedure.  Follow-Up: Your physician recommends that you schedule a follow-up appointment after testing with Dr. Allyson SabalBerry.  If you need a refill on your cardiac medications before your next appointment, please call your pharmacy.

## 2017-01-07 ENCOUNTER — Other Ambulatory Visit (HOSPITAL_COMMUNITY): Payer: Medicare Other

## 2017-01-08 ENCOUNTER — Other Ambulatory Visit: Payer: Self-pay

## 2017-01-08 ENCOUNTER — Ambulatory Visit (HOSPITAL_COMMUNITY): Payer: Medicare Other | Attending: Cardiology

## 2017-01-08 DIAGNOSIS — I2510 Atherosclerotic heart disease of native coronary artery without angina pectoris: Secondary | ICD-10-CM | POA: Insufficient documentation

## 2017-01-08 DIAGNOSIS — E785 Hyperlipidemia, unspecified: Secondary | ICD-10-CM | POA: Diagnosis not present

## 2017-01-08 DIAGNOSIS — R011 Cardiac murmur, unspecified: Secondary | ICD-10-CM | POA: Diagnosis not present

## 2017-01-08 DIAGNOSIS — I10 Essential (primary) hypertension: Secondary | ICD-10-CM | POA: Diagnosis not present

## 2017-01-08 MED ORDER — PERFLUTREN LIPID MICROSPHERE
1.0000 mL | INTRAVENOUS | Status: AC | PRN
  Administered 2017-01-08: 2 mL via INTRAVENOUS

## 2017-01-09 ENCOUNTER — Telehealth (HOSPITAL_COMMUNITY): Payer: Self-pay

## 2017-01-09 NOTE — Telephone Encounter (Signed)
Encounter complete. 

## 2017-01-14 ENCOUNTER — Ambulatory Visit (HOSPITAL_COMMUNITY)
Admission: RE | Admit: 2017-01-14 | Discharge: 2017-01-14 | Disposition: A | Discharge status: Home / Self Care | Payer: Medicare Other | Source: Ambulatory Visit | Attending: Cardiovascular Disease | Admitting: Cardiovascular Disease

## 2017-01-14 DIAGNOSIS — R0989 Other specified symptoms and signs involving the circulatory and respiratory systems: Secondary | ICD-10-CM | POA: Insufficient documentation

## 2017-01-14 DIAGNOSIS — K219 Gastro-esophageal reflux disease without esophagitis: Secondary | ICD-10-CM | POA: Insufficient documentation

## 2017-01-14 DIAGNOSIS — I6523 Occlusion and stenosis of bilateral carotid arteries: Secondary | ICD-10-CM | POA: Insufficient documentation

## 2017-01-14 DIAGNOSIS — R079 Chest pain, unspecified: Secondary | ICD-10-CM | POA: Insufficient documentation

## 2017-01-14 DIAGNOSIS — E785 Hyperlipidemia, unspecified: Secondary | ICD-10-CM | POA: Diagnosis not present

## 2017-01-14 DIAGNOSIS — I2510 Atherosclerotic heart disease of native coronary artery without angina pectoris: Secondary | ICD-10-CM | POA: Insufficient documentation

## 2017-01-14 DIAGNOSIS — Z951 Presence of aortocoronary bypass graft: Secondary | ICD-10-CM | POA: Diagnosis not present

## 2017-01-14 DIAGNOSIS — R001 Bradycardia, unspecified: Secondary | ICD-10-CM | POA: Diagnosis not present

## 2017-01-14 DIAGNOSIS — R0609 Other forms of dyspnea: Secondary | ICD-10-CM | POA: Diagnosis not present

## 2017-01-14 DIAGNOSIS — I10 Essential (primary) hypertension: Secondary | ICD-10-CM | POA: Insufficient documentation

## 2017-01-14 LAB — MYOCARDIAL PERFUSION IMAGING
CHL CUP NUCLEAR SDS: 0
CHL CUP NUCLEAR SRS: 0
CHL CUP NUCLEAR SSS: 0
CSEPPHR: 77 {beats}/min
LV dias vol: 51 mL (ref 46–106)
LV sys vol: 8 mL
Rest HR: 50 {beats}/min
TID: 1.04

## 2017-01-14 MED ORDER — REGADENOSON 0.4 MG/5ML IV SOLN
0.4000 mg | Freq: Once | INTRAVENOUS | Status: AC
  Administered 2017-01-14: 0.4 mg via INTRAVENOUS

## 2017-01-14 MED ORDER — TECHNETIUM TC 99M TETROFOSMIN IV KIT
31.6000 | PACK | Freq: Once | INTRAVENOUS | Status: AC | PRN
  Administered 2017-01-14: 31.6 via INTRAVENOUS
  Filled 2017-01-14: qty 32

## 2017-01-14 MED ORDER — TECHNETIUM TC 99M TETROFOSMIN IV KIT
10.4000 | PACK | Freq: Once | INTRAVENOUS | Status: AC | PRN
  Administered 2017-01-14: 10.4 via INTRAVENOUS
  Filled 2017-01-14: qty 11

## 2017-01-17 ENCOUNTER — Encounter: Payer: Self-pay | Admitting: Cardiovascular Disease

## 2017-01-17 ENCOUNTER — Ambulatory Visit: Payer: Medicare Other | Admitting: Cardiovascular Disease

## 2017-01-17 DIAGNOSIS — I10 Essential (primary) hypertension: Secondary | ICD-10-CM

## 2017-01-17 DIAGNOSIS — E7800 Pure hypercholesterolemia, unspecified: Secondary | ICD-10-CM

## 2017-01-17 DIAGNOSIS — R0602 Shortness of breath: Secondary | ICD-10-CM | POA: Insufficient documentation

## 2017-01-17 MED ORDER — AMLODIPINE BESYLATE 5 MG PO TABS: 5.0000 mg | ORAL_TABLET | Freq: Every day | ORAL | 6 refills | Status: DC

## 2017-01-17 NOTE — Assessment & Plan Note (Signed)
History of essential hypertension blood pressure measured today at 173/72. She is on low-dose amlodipine which she just started yesterday and was supposedly on prior to that and metoprolol. Going to increase her amlodipine from 2.5-5 mg a day. She'll keep a blood pressure log for 3 days and see Belenda CruiseKristin back after that for review and titration

## 2017-01-17 NOTE — Assessment & Plan Note (Signed)
History of hyperlipidemia on statin therapy followed by her PCP. 

## 2017-01-17 NOTE — Patient Instructions (Signed)
Medication Instructions: Increase Amlodipine to 5 mg daily.   Follow-Up: Your physician recommends that you schedule a follow-up appointment in: 1 month with PharmD in [facility]. Your physician has requested that you regularly monitor and record your blood pressure readings at home. Please use the same machine at the same time of day to check your readings and record them to bring to your follow-up visit.  We request that you follow-up in: 3 months with an extender and in 6 months with Dr San MorelleBerry  You will receive a reminder letter in the mail two months in advance. If you don't receive a letter, please call our office to schedule the follow-up appointment.  If you need a refill on your cardiac medications before your next appointment, please call your pharmacy.

## 2017-01-17 NOTE — Progress Notes (Signed)
01/17/2017 Wendy Mandesthel M Napierkowski   1922-06-16  [id number]  Primary Physician Rodrigo RanPerini, Mark, MD Primary Cardiologist: Runell GessJonathan J Maycie Luera MD FACP, [place], [place], [place]  HPI:  Wendy Wiley is a 83 y.o.  widowed Caucas she is the mother of 2, grandmother 5 grandchildren whose husband of 71 years passed away in October 2017. She is accompanied by her sister Erlene SentersGail Duncan today as well as her son Renae Fickleaul. I last saw her in the office 12/31/16. 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 She has a history of treated hypertension and hyperlipidemia. She had bypass graft in 1999. She's had increasing dyspnea on exertion and some nitroglycerin glycerin responsive chest pain recently and was seen in the emergency room 12/27/16. She ruled out for myocardial infarction. Since I saw her I did obtain a 2-D echo which was normal with an EF is 65-70% and aortic sclerosis with mild elevation in RV systolic pressure. Myoview stress test was normal as well. Carotid Doppler showed no evidence of ICA stenosis.     Current Meds  Medication Sig  . amLODipine (NORVASC) 5 MG tablet Take 1 tablet (5 mg total) by mouth daily.  Marland Kitchen. aspirin EC 81 MG tablet Take 81 mg by mouth at bedtime.   . calcium carbonate (OSCAL) 1500 (600 Ca) MG TABS tablet Take 600 mg of elemental calcium by mouth daily with breakfast.   . escitalopram (LEXAPRO) 5 MG tablet Take 5 mg by mouth at bedtime.  Marland Kitchen. ezetimibe (ZETIA) 10 MG tablet Take 10 mg by mouth at bedtime.  . furosemide (LASIX) 20 MG tablet Take 20 mg by mouth daily.  Marland Kitchen. levothyroxine (SYNTHROID, LEVOTHROID) 200 MCG tablet Take 200 mcg by mouth daily before breakfast.   . meclizine (ANTIVERT) 12.5 MG tablet Take 1 tablet (12.5 mg total) by mouth 2 (two) times daily as needed for dizziness.  . metoprolol tartrate (LOPRESSOR) 25 MG tablet Take 25 mg by mouth 2 (two) times daily.   . Multiple Vitamins-Minerals (PRESERVISION AREDS 2) CAPS Take 1 capsule by mouth 2 (two) times daily.  Marland Kitchen.  MYRBETRIQ 25 MG TB24 tablet Take 25 mg by mouth daily.  . potassium chloride (K-DUR,KLOR-CON) 10 MEQ tablet Take 10 mEq by mouth daily.  . ranitidine (ZANTAC) 150 MG tablet Take 150 mg by mouth as needed.  . rosuvastatin (CRESTOR) 20 MG tablet Take 20 mg by mouth at bedtime.  . [DISCONTINUED] amLODipine (NORVASC) 2.5 MG tablet Take 2.5 mg by mouth daily.     Allergies  Allergen Reactions  . Ciprofloxacin Other (See Comments)    Reaction:  Unknown   . Codeine Other (See Comments)    Reaction:  Altered mental status   . Quinine Swelling    Social History   Socioeconomic History  . Marital status: Widowed    Spouse name: Not on file  . Number of children: Not on file  . Years of education: Not on file  . Highest education level: Not on file  Social Needs  . Financial resource strain: Not on file  . Food insecurity - worry: Not on file  . Food insecurity - inability: Not on file  . Transportation needs - medical: Not on file  . Transportation needs - non-medical: Not on file  Occupational History  . Not on file  Tobacco Use  . Smoking status: Never Smoker  . Smokeless tobacco: Never Used  Substance and Sexual Activity  . Alcohol use: No  . Drug use: No  .  Sexual activity: Yes    Birth control/protection: Post-menopausal  Other Topics Concern  . Not on file  Social History Narrative  . Not on file     Review of Systems: General: negative for chills, fever, night sweats or weight changes.  Cardiovascular: negative for chest pain, dyspnea on exertion, edema, orthopnea, palpitations, paroxysmal nocturnal dyspnea or shortness of breath Dermatological: negative for rash Respiratory: negative for cough or wheezing Urologic: negative for hematuria Abdominal: negative for nausea, vomiting, diarrhea, bright red blood per rectum, melena, or hematemesis Neurologic: negative for visual changes, syncope, or dizziness All other systems reviewed and are otherwise negative except as  noted above.    Blood pressure (!) 173/72, pulse 79, height 4\' 10"  (1.473 m), weight 128 lb 6.4 oz (58.2 kg).  General appearance: alert and no distress Neck: no adenopathy, no JVD, supple, symmetrical, trachea midline, thyroid not enlarged, symmetric, no tenderness/mass/nodules and Bilateral carotid bruits versus transmitted murmur Lungs: clear to auscultation bilaterally Heart: 2/6 systolic ejection murmur consistent with aortic sclerosis and/or stenosis. Extremities: extremities normal, atraumatic, no cyanosis or edema Pulses: 2+ and symmetric Skin: Skin color, texture, turgor normal. No rashes or lesions Neurologic: Alert and oriented X 3, normal strength and tone. Normal symmetric reflexes. Normal coordination and gait  EKG not performed today  ASSESSMENT AND PLAN:   Essential hypertension History of essential hypertension blood pressure measured today at 173/72. She is on low-dose amlodipine which she just started yesterday and was supposedly on prior to that and metoprolol. Going to increase her amlodipine from 2.5-5 mg a day. She'll keep a blood pressure log for 3 days and see Belenda CruiseKristin back after that for review and titration  Hyperlipidemia History of hyperlipidemia on statin therapy followed by her PCP  Shortness of breath Ms. Bahar has been complaining of increasing dyspnea on exertion and some atypical chest pain. She had a recent 2-D echo in our office performed 01/08/17 revealing normal LV systolic pressure with aortic valve sclerosis and mild elevation of RV systolic pressure 40 mmHg. There are no other valvular abnormalities noted. A Myoview stress test was entirely normal. She is on a low-dose diuretic. Her sister says that she wheezes when she walks. I suspect that she should see a pulmonologist for further evaluation.      Runell GessJonathan J. Chenell Lozon MD FACP,FACC,FAHA, [facility] Center[facility]SCAI 01/17/2017 3:15 PM

## 2017-01-17 NOTE — Assessment & Plan Note (Signed)
Wendy Wiley has been complaining of increasing dyspnea on exertion and some atypical chest pain. She had a recent 2-D echo in our office performed 01/08/17 revealing normal LV systolic pressure with aortic valve sclerosis and mild elevation of RV systolic pressure 40 mmHg. There are no other valvular abnormalities noted. A Myoview stress test was entirely normal. She is on a low-dose diuretic. Her sister says that she wheezes when she walks. I suspect that she should see a pulmonologist for further evaluation.

## 2017-02-18 ENCOUNTER — Ambulatory Visit (INDEPENDENT_AMBULATORY_CARE_PROVIDER_SITE_OTHER): Payer: Medicare Other | Admitting: Pharmacist

## 2017-02-18 VITALS — BP 124/50 | HR 58

## 2017-02-18 DIAGNOSIS — I10 Essential (primary) hypertension: Secondary | ICD-10-CM

## 2017-02-18 NOTE — Assessment & Plan Note (Addendum)
According to the 2017 Hypertension guidelines, patient's blood pressure goal is < 130/80. Patient met blood pressure goal today with a blood pressure of 124/50. Patient is going to continue blood pressure monitoring at home and continue all previously prescribed medications without changes. She will f/u with HTN clinic as needed and PA Rosaria Ferries at the end of March.

## 2017-02-18 NOTE — Patient Instructions (Addendum)
Return for a  follow up appointment as needed   Check your blood pressure at home daily (if able) and keep record of the readings.  Take your BP meds as follows: *ALL medication as prescribed*  Bring all of your meds, your BP cuff and your record of home blood pressures to your next appointment.  Exercise as you're able, try to walk approximately 30 minutes per day.  Keep salt intake to a minimum, especially watch canned and prepared boxed foods.  Eat more fresh fruits and vegetables and fewer canned items.  Avoid eating in fast food restaurants.    HOW TO TAKE YOUR BLOOD PRESSURE: . Rest 5 minutes before taking your blood pressure. .  Don't smoke or drink caffeinated beverages for at least 30 minutes before. . Take your blood pressure before (not after) you eat. . Sit comfortably with your back supported and both feet on the floor (don't cross your legs). . Elevate your arm to heart level on a table or a desk. . Use the proper sized cuff. It should fit smoothly and snugly around your bare upper arm. There should be enough room to slip a fingertip under the cuff. The bottom edge of the cuff should be 1 inch above the crease of the elbow. . Ideally, take 3 measurements at one sitting and record the average.    

## 2017-02-18 NOTE — Progress Notes (Signed)
Patient ID: [name]                 DOB: October 10, 1922                      MRN: [id number]     HPI: Wendy Wiley is a 84 y.o. female referred by Dr. Gwenlyn Found to HTN clinic. She stopped her amlodipine 2.5 mg daily in April for unknown reason and was not on it until December where she resume the medication 3 days before her visit with OV with cardiologist, Dr. Gwenlyn Found then increased her amlodipine to 5 mg daily on 01/17/17 after her BP was 173/72 in office. She has been on the amlodipine for a month and denies peripheral edema. She also denies chest pain, headaches, dizziness, and lightheadedness.    Current HTN meds:  Amlodipine 5 mg daily  Furosemide 20 mg daily Metoprolol tartrate (Lopressor) 25 mg BID  Previously tried: None   BP goal: < 130/80   Family History: Father and son both had high blood pressure  Social History: Denies smoking and alcohol use  Diet: Follows a low salt diet  Exercise: Denies exercise  Home BP readings:  Patient did not bring her blood pressure readings today or home BP monitor.  She says that the systolic ranges from 166-063.  Wt Readings from Last 3 Encounters:  01/17/17 128 lb 6.4 oz (58.2 kg)  01/14/17 126 lb (57.2 kg)  12/31/16 126 lb (57.2 kg)   BP Readings from Last 3 Encounters:  02/18/17 (!) 124/50  01/17/17 (!) 173/72  12/31/16 (!) 150/70   Pulse Readings from Last 3 Encounters:  02/18/17 (!) 58  01/17/17 79  12/31/16 (!) 56    Past Medical History:  Diagnosis Date  . Anginal pain ([place])   . Anxiety   . Arthritis   . Depression   . GERD (gastroesophageal reflux disease)   . Hypertension   . Hypothyroidism     Current Outpatient Medications on File Prior to Visit  Medication Sig Dispense Refill  . budesonide-formoterol (SYMBICORT) 160-4.5 MCG/ACT inhaler Inhale 2 puffs into the lungs 2 (two) times daily.    Marland Kitchen amLODipine (NORVASC) 5 MG tablet Take 1 tablet (5 mg total) by mouth daily. 30 tablet 6  . aspirin EC 81 MG tablet  Take 81 mg by mouth at bedtime.     . calcium carbonate (OSCAL) 1500 (600 Ca) MG TABS tablet Take 600 mg of elemental calcium by mouth daily with breakfast.     . escitalopram (LEXAPRO) 5 MG tablet Take 5 mg by mouth at bedtime.    Marland Kitchen ezetimibe (ZETIA) 10 MG tablet Take 10 mg by mouth at bedtime.    . furosemide (LASIX) 20 MG tablet Take 20 mg by mouth daily.  3  . levothyroxine (SYNTHROID, LEVOTHROID) 200 MCG tablet Take 200 mcg by mouth daily before breakfast.   8  . meclizine (ANTIVERT) 12.5 MG tablet Take 1 tablet (12.5 mg total) by mouth 2 (two) times daily as needed for dizziness. 15 tablet 0  . metoprolol tartrate (LOPRESSOR) 25 MG tablet Take 25 mg by mouth 2 (two) times daily.   10  . Multiple Vitamins-Minerals (PRESERVISION AREDS 2) CAPS Take 1 capsule by mouth 2 (two) times daily.    Marland Kitchen MYRBETRIQ 25 MG TB24 tablet Take 25 mg by mouth daily.  11  . potassium chloride (K-DUR,KLOR-CON) 10 MEQ tablet Take 10 mEq by mouth daily.  1  . ranitidine (  ZANTAC) 150 MG tablet Take 150 mg by mouth as needed.  3  . rosuvastatin (CRESTOR) 20 MG tablet Take 20 mg by mouth at bedtime.  6   No current facility-administered medications on file prior to visit.     Allergies  Allergen Reactions  . Ciprofloxacin Other (See Comments)    Reaction:  Unknown   . Codeine Other (See Comments)    Reaction:  Altered mental status   . Quinine Swelling    Blood pressure (!) 124/50, pulse (!) 58, SpO2 94 %.  Essential hypertension According to the 2017 Hypertension guidelines, patient's blood pressure goal is < 130/80. Patient met blood pressure goal today with a blood pressure of 124/50. Patient is going to continue blood pressure monitoring at home and continue all previously prescribed medications without changes. She will f/u with HTN clinic as needed and PA Rosaria Ferries at the end of March.   Jones Broom PharmD Candidate [place]  Rodriguez-Guzman PharmD, BCPS, [facility] [address] [place] [id number] 02/19/2017 3:55 PM

## 2017-02-19 ENCOUNTER — Encounter: Payer: Self-pay | Admitting: Pharmacist

## 2017-04-24 ENCOUNTER — Ambulatory Visit: Payer: Medicare Other | Admitting: Physician Assistant

## 2017-07-13 ENCOUNTER — Emergency Department (HOSPITAL_COMMUNITY): Payer: Medicare Other

## 2017-07-13 ENCOUNTER — Encounter (HOSPITAL_COMMUNITY): Payer: Self-pay | Admitting: Nurse Practitioner

## 2017-07-13 ENCOUNTER — Emergency Department (HOSPITAL_COMMUNITY)
Admission: EM | Admit: 2017-07-13 | Discharge: 2017-07-13 | Disposition: A | Discharge status: Home / Self Care | Payer: Medicare Other | Attending: Emergency Medicine | Admitting: Emergency Medicine

## 2017-07-13 DIAGNOSIS — S6991XA Unspecified injury of right wrist, hand and finger(s), initial encounter: Secondary | ICD-10-CM | POA: Diagnosis present

## 2017-07-13 DIAGNOSIS — W0110XA Fall on same level from slipping, tripping and stumbling with subsequent striking against unspecified object, initial encounter: Secondary | ICD-10-CM | POA: Diagnosis not present

## 2017-07-13 DIAGNOSIS — S62644A Nondisplaced fracture of proximal phalanx of right ring finger, initial encounter for closed fracture: Secondary | ICD-10-CM | POA: Insufficient documentation

## 2017-07-13 DIAGNOSIS — Z7982 Long term (current) use of aspirin: Secondary | ICD-10-CM | POA: Diagnosis not present

## 2017-07-13 DIAGNOSIS — E039 Hypothyroidism, unspecified: Secondary | ICD-10-CM | POA: Insufficient documentation

## 2017-07-13 DIAGNOSIS — Y929 Unspecified place or not applicable: Secondary | ICD-10-CM | POA: Insufficient documentation

## 2017-07-13 DIAGNOSIS — I10 Essential (primary) hypertension: Secondary | ICD-10-CM | POA: Diagnosis not present

## 2017-07-13 DIAGNOSIS — Y999 Unspecified external cause status: Secondary | ICD-10-CM | POA: Diagnosis not present

## 2017-07-13 DIAGNOSIS — S0990XA Unspecified injury of head, initial encounter: Secondary | ICD-10-CM | POA: Diagnosis not present

## 2017-07-13 DIAGNOSIS — Z79899 Other long term (current) drug therapy: Secondary | ICD-10-CM | POA: Insufficient documentation

## 2017-07-13 DIAGNOSIS — Y939 Activity, unspecified: Secondary | ICD-10-CM | POA: Insufficient documentation

## 2017-07-13 MED ORDER — ACETAMINOPHEN 500 MG PO TABS
500.0000 mg | ORAL_TABLET | Freq: Once | ORAL | Status: AC
  Administered 2017-07-13: 500 mg via ORAL
  Filled 2017-07-13: qty 1

## 2017-07-13 NOTE — Discharge Instructions (Signed)
It was my pleasure taking care of you today!  Please call the orthopedic doctor listed tomorrow morning to schedule a follow-up appointment.  Ice affected area as needed for pain/swelling (instructions below).  Tylenol as needed for pain.  Return to ER for new or worsening symptoms, any additional concerns.  COLD THERAPY DIRECTIONS:  Ice or gel packs can be used to reduce both pain and swelling. Ice is the most helpful within the first 24 to 48 hours after an injury or flareup from overusing a muscle or joint.  Ice is effective, has very few side effects, and is safe for most people to use.   If you expose your skin to cold temperatures for too long or without the proper protection, you can damage your skin or nerves. Watch for signs of skin damage due to cold.   HOME CARE INSTRUCTIONS  Follow these tips to use ice and cold packs safely.  Place a dry or damp towel between the ice and skin. A damp towel will cool the skin more quickly, so you may need to shorten the time that the ice is used.  For a more rapid response, add gentle compression to the ice.  Ice for no more than 10 to 20 minutes at a time. The bonier the area you are icing, the less time it will take to get the benefits of ice.  Check your skin after 5 minutes to make sure there are no signs of a poor response to cold or skin damage.  Rest 20 minutes or more in between uses.  Once your skin is numb, you can end your treatment. You can test numbness by very lightly touching your skin. The touch should be so light that you do not see the skin dimple from the pressure of your fingertip. When using ice, most people will feel these normal sensations in this order: cold, burning, aching, and numbness.

## 2017-07-13 NOTE — ED Provider Notes (Signed)
[facility]-EMERGENCY DEPT Provider Note   CSN: [id number] Arrival date & time: 07/13/17  1913     History   Chief Complaint Chief Complaint  Patient presents with  . Wrist Pain  . Hand Pain    HPI Wendy Wiley is a 84 y.o. female.  The history is provided by the patient and medical records. No language interpreter was used.  Wrist Pain  Pertinent negatives include no headaches.  Hand Pain  Pertinent negatives include no headaches.   Wendy Wiley is a 84 y.o. female  with a PMH as listed below who presents to the Emergency Department for evaluation after mechanical fall.  Patient tripped and landed on her right hand.  She does not remember hitting her head, but does note bruising to the right side of her face which is new.  No loss of consciousness. She is complaining of pain to the right hand.  Pain is worse with certain movements and palpation.  No numbness, tingling.  No medications taken prior to arrival for symptoms.  She is on a baby aspirin, but does not believe she is on any other blood thinning medications.   Past Medical History:  Diagnosis Date  . Anginal pain ([facility])   . Anxiety   . Arthritis   . Depression   . GERD (gastroesophageal reflux disease)   . Hypertension   . Hypothyroidism     Patient Active Problem List   Diagnosis Date Noted  . Shortness of breath 01/17/2017  . Essential hypertension 12/31/2016  . Hyperlipidemia 12/31/2016  . GASTRITIS 07/28/2009  . DYSPHONIA, SPASTIC 07/28/2009  . DIARRHEA 07/28/2009  . CAD 05/06/2007  . OTHER DISEASES OF LARYNX 05/06/2007  . GERD 05/06/2007  . DYSPEPSIA 05/06/2007  . DIVERTICULOSIS, MILD 05/06/2007  . HEARTBURN 05/06/2007    Past Surgical History:  Procedure Laterality Date  . APPENDECTOMY     with ovarian cyst removal  . COLONOSCOPY    . CORONARY ARTERY BYPASS GRAFT    . EYE SURGERY     B/L cataract  . OVARIAN CYST REMOVAL    . UPPER GI ENDOSCOPY       OB History     None      Home Medications    Prior to Admission medications   Medication Sig Start Date End Date Taking? Authorizing Provider  amLODipine (NORVASC) 5 MG tablet Take 1 tablet (5 mg total) by mouth daily. 01/17/17  Yes Runell Gess, MD  aspirin EC 81 MG tablet Take 81 mg by mouth at bedtime.    Yes [provider]  budesonide-formoterol (SYMBICORT) 160-4.5 MCG/ACT inhaler Inhale 2 puffs into the lungs as needed (wheezing).    Yes [provider]  calcium carbonate (OSCAL) 1500 (600 Ca) MG TABS tablet Take 600 mg of elemental calcium by mouth daily with breakfast.    Yes [provider]  escitalopram (LEXAPRO) 5 MG tablet Take 5 mg by mouth at bedtime.   Yes [provider]  ezetimibe (ZETIA) 10 MG tablet Take 10 mg by mouth at bedtime.   Yes [provider]  furosemide (LASIX) 20 MG tablet Take 20 mg by mouth daily. 11/11/16  Yes [provider]  levothyroxine (SYNTHROID, LEVOTHROID) 200 MCG tablet Take 200 mcg by mouth daily before breakfast.    Yes [provider]  meclizine (ANTIVERT) 12.5 MG tablet Take 1 tablet (12.5 mg total) by mouth 2 (two) times daily as needed for dizziness. 05/02/15  Yes Freida Busman,  Ethelene Browns, MD  metoprolol tartrate (LOPRESSOR) 25 MG tablet Take 25 mg by mouth 2 (two) times daily.    Yes [provider]  Multiple Vitamins-Minerals (PRESERVISION AREDS 2) CAPS Take 1 capsule by mouth 2 (two) times daily.   Yes [provider]  MYRBETRIQ 25 MG TB24 tablet Take 25 mg by mouth daily.   Yes [provider]  potassium chloride (K-DUR,KLOR-CON) 10 MEQ tablet Take 10 mEq by mouth daily. 11/11/16  Yes [provider]  ranitidine (ZANTAC) 150 MG tablet Take 150 mg by mouth as needed. 11/18/16  Yes [provider]  rosuvastatin (CRESTOR) 20 MG tablet Take 20 mg by mouth at bedtime. 10/23/16  Yes [provider]    Family History History reviewed. No pertinent  family history.  Social History Social History   Tobacco Use  . Smoking status: Never Smoker  . Smokeless tobacco: Never Used  Substance Use Topics  . Alcohol use: No  . Drug use: No     Allergies   Ciprofloxacin; Codeine; and Quinine   Review of Systems Review of Systems  Musculoskeletal: Positive for arthralgias and myalgias.  Skin: Positive for color change (Bruising).  Neurological: Negative for dizziness, syncope, weakness, numbness and headaches.  All other systems reviewed and are negative.    Physical Exam Updated Vital Signs BP 139/61 (BP Location: Left Arm)   Pulse 62   Temp 97.9 F (36.6 C) (Oral)   Resp 17   SpO2 95%   Physical Exam  Constitutional: She is oriented to person, place, and time. She appears well-developed and well-nourished. No distress.  HENT:  Head: Normocephalic.  Nose: Nose normal.  Bruising to right forehead.  No battle signs or raccoon eyes.  No hemotympanum.   Neck:  No midline or paraspinal tenderness.  Full range of motion without pain.  Cardiovascular: Normal rate, regular rhythm and normal heart sounds.  No murmur heard. Pulmonary/Chest: Effort normal and breath sounds normal. No respiratory distress.  Abdominal: Soft. She exhibits no distension. There is no tenderness.  Musculoskeletal:  Tenderness and swelling to third and fourth metacarpals with overlying ecchymosis.  No open wounds.  Decreased range of motion likely secondary to pain.  2+ radial pulse.  Sensation intact.  Good cap refill.  Neurological: She is alert and oriented to person, place, and time.  Alert, oriented, thought content appropriate, able to give a coherent history. Speech is clear and goal oriented, able to follow commands.  Cranial Nerves:  II:  Peripheral visual fields grossly normal, pupils equal, round, reactive to light III, IV, VI: EOM intact bilaterally, ptosis not present V,VII: smile symmetric, eyes kept closed tightly against resistance,  facial light touch sensation equal VIII: hearing grossly normal IX, X: symmetric soft palate movement, uvula elevates symmetrically  XI: bilateral shoulder shrug symmetric and strong XII: midline tongue extension Sensory to light touch normal in all four extremities.  Normal finger-to-nose and rapid alternating movements; normal gait and balance.  Skin: Skin is warm and dry.  Nursing note and vitals reviewed.    ED Treatments / Results  Labs (all labs ordered are listed, but only abnormal results are displayed) Labs Reviewed - No data to display  EKG None  Radiology Dg Wrist Complete Right  Result Date: 07/13/2017 CLINICAL DATA:  Fall with bruising EXAM: RIGHT WRIST - COMPLETE 3+ VIEW COMPARISON:  None. FINDINGS: Acute mildly displaced fracture with intra-articular extension base of the fourth proximal phalanx. Arthritis at the first [facility] joint. No acute displaced  fracture or malalignment at the wrist. IMPRESSION: 1. Acute intra-articular fracture base of fourth proximal phalanx 2. Otherwise no acute osseous abnormality in the wrist Electronically Signed   By: Jasmine PangKim  Fujinaga M.D.   On: 07/13/2017 20:43   Ct Head Wo Contrast  Result Date: 07/13/2017 CLINICAL DATA:  Tripped and fell on sidewalk, struck head. History of hypertension. EXAM: CT HEAD WITHOUT CONTRAST TECHNIQUE: Contiguous axial images were obtained from the base of the skull through the vertex without intravenous contrast. COMPARISON:  CT HEAD September 16, 2015 FINDINGS: BRAIN: No intraparenchymal hemorrhage, mass effect nor midline shift. Moderate to severe parenchymal brain volume loss. Confluent supratentorial white matter hypodensities. No acute large vascular territory infarcts. No abnormal extra-axial fluid collections. Basal cisterns are patent. VASCULAR: Mild calcific atherosclerosis of the carotid siphons. SKULL: No skull fracture. No significant scalp soft tissue swelling. SINUSES/ORBITS: Severe chronic RIGHT maxillary  sinusitis with bony remodeling. Mastoid air cells are well aerated.The included ocular globes and orbital contents are non-suspicious. Status post bilateral ocular lens. OTHER: None. IMPRESSION: 1. No acute intracranial process. 2. Stable moderate to severe parenchymal brain volume loss. 3. Stable moderate to severe chronic small vessel ischemic changes. Electronically Signed   By: Awilda Metroourtnay  Bloomer M.D.   On: 07/13/2017 22:11   Dg Hand Complete Right  Result Date: 07/13/2017 CLINICAL DATA:  Pain and bruising 3rd to 5th metacarpal EXAM: RIGHT HAND - COMPLETE 3+ VIEW COMPARISON:  06/13/2009 FINDINGS: Acute minimally displaced fracture base of the fourth proximal phalanx, with likely intra-articular extension to the MCP joint. No subluxation. No other discrete fractures are visible. Arthritis at the first [facility] joint, second and fourth MCP joints, third PIP joint and second and third DIP joints. IMPRESSION: 1. Acute minimally displaced likely intra-articular fracture base of the fourth proximal phalanx. 2. Scattered arthritis in the right hand. Electronically Signed   By: Jasmine PangKim  Fujinaga M.D.   On: 07/13/2017 20:42    Procedures Procedures (including critical care time)  Medications Ordered in ED Medications  acetaminophen (TYLENOL) tablet 500 mg (500 mg Oral Given 07/13/17 2213)     Initial Impression / Assessment and Plan / ED Course  I have reviewed the triage vital signs and the nursing notes.  Pertinent labs & imaging results that were available during my care of the patient were reviewed by me and considered in my medical decision making (see chart for details).    Wendy Wiley is a 84 y.o. female who presents to ED for valuation after mechanical fall just prior to arrival.  Patient fell on her right hand.  Also struck her head.  She is on a baby aspirin, but no other anticoagulants.  No focal neuro deficits on exam.  Right hand is neurovascularly intact with no open wounds, however she does  have tenderness and swelling to third and fourth metacarpals.  CT head negative.  X-ray of the hand reviewed showing acute intra-articular fracture at the base of the fourth proximal phalanx.  Placed in finger splint immobilization and will have follow-up with hand surgery.  Symptomatic home care instructions, follow-up care plan and reasons to return to the emergency department were discussed with patient and family at bedside.  All questions answered.  Patient seen by and discussed with Dr. Deretha EmoryZackowski who agrees with treatment plan.     Final Clinical Impressions(s) / ED Diagnoses   Final diagnoses:  Closed nondisplaced fracture of proximal phalanx of right ring finger, initial encounter    ED Discharge Orders  None       Ward, Chase Picket, PA-C 07/13/17 2246    Vanetta Mulders, MD 07/13/17 2250

## 2017-07-13 NOTE — ED Triage Notes (Signed)
Pt presents with c/o right wrist and hand pain/injury, states she stumbled and fell. No overt swelling or deformity at this time.

## 2017-07-18 ENCOUNTER — Ambulatory Visit: Payer: Medicare Other | Admitting: Cardiovascular Disease

## 2017-08-05 ENCOUNTER — Ambulatory Visit: Payer: Medicare Other | Admitting: Cardiovascular Disease

## 2017-08-05 ENCOUNTER — Encounter: Payer: Self-pay | Admitting: Cardiovascular Disease

## 2017-08-05 VITALS — BP 138/58 | HR 50 | Wt 127.6 lb

## 2017-08-05 DIAGNOSIS — I10 Essential (primary) hypertension: Secondary | ICD-10-CM

## 2017-08-05 DIAGNOSIS — R0602 Shortness of breath: Secondary | ICD-10-CM

## 2017-08-05 DIAGNOSIS — I25810 Atherosclerosis of coronary artery bypass graft(s) without angina pectoris: Secondary | ICD-10-CM

## 2017-08-05 DIAGNOSIS — E7800 Pure hypercholesterolemia, unspecified: Secondary | ICD-10-CM

## 2017-08-05 NOTE — Assessment & Plan Note (Signed)
History of hyperlipidemia on statin therapy. 

## 2017-08-05 NOTE — Progress Notes (Signed)
08/05/2017 Wendy Mandesthel M Bernet   1922/10/13  [id number]  Primary Physician Rodrigo RanPerini, Mark, MD Primary Cardiologist: Runell GessJonathan J Yuvaan Olander MD FACP, [place]ACC, [place], [place]  HPI:  Wendy Wiley is a 84 y.o.  widowed [place] she is the mother of 2, grandmother 5 grandchildren whose husband of 71 years passed away in October 2017. She is accompanied by her daughter-in-law Wendy Wiley  today.  I last saw her in the office 01/17/2017. 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 She has a history of treated hypertension and hyperlipidemia. She had bypass graft in 1999. She's had increasing dyspnea on exertion and some nitroglycerin glycerin responsive chest pain recently and was seen in the emergency room 12/27/16. She ruled out for myocardial infarction. Since I saw her I did obtain a 2-D echo which was normal with an EF is 65-70% and aortic sclerosis with mild elevation in RV systolic pressure. Myoview stress test was normal as well. Carotid Doppler showed no evidence of ICA stenosis. Since I saw her 6 months ago she is been relatively asymptomatic.    Current Meds  Medication Sig  . amLODipine (NORVASC) 5 MG tablet Take 1 tablet (5 mg total) by mouth daily.  Marland Kitchen. aspirin EC 81 MG tablet Take 81 mg by mouth at bedtime.   . budesonide-formoterol (SYMBICORT) 160-4.5 MCG/ACT inhaler Inhale 2 puffs into the lungs as needed (wheezing).   . calcium carbonate (OSCAL) 1500 (600 Ca) MG TABS tablet Take 600 mg of elemental calcium by mouth daily with breakfast.   . escitalopram (LEXAPRO) 5 MG tablet Take 5 mg by mouth at bedtime.  Marland Kitchen. ezetimibe (ZETIA) 10 MG tablet Take 10 mg by mouth at bedtime.  . furosemide (LASIX) 20 MG tablet Take 20 mg by mouth daily.  Marland Kitchen. levothyroxine (SYNTHROID, LEVOTHROID) 200 MCG tablet Take 200 mcg by mouth daily before breakfast.   . meclizine (ANTIVERT) 12.5 MG tablet Take 1 tablet (12.5 mg total) by mouth 2 (two) times daily as needed for dizziness.  . metoprolol tartrate (LOPRESSOR) 25  MG tablet Take 25 mg by mouth 2 (two) times daily.   . Multiple Vitamins-Minerals (PRESERVISION AREDS 2) CAPS Take 1 capsule by mouth 2 (two) times daily.  Marland Kitchen. MYRBETRIQ 25 MG TB24 tablet Take 25 mg by mouth daily.  . potassium chloride (K-DUR,KLOR-CON) 10 MEQ tablet Take 10 mEq by mouth daily.  . ranitidine (ZANTAC) 150 MG tablet Take 150 mg by mouth as needed.  . rosuvastatin (CRESTOR) 20 MG tablet Take 20 mg by mouth at bedtime.     Allergies  Allergen Reactions  . Ciprofloxacin Other (See Comments)    Reaction:  Unknown   . Codeine Other (See Comments)    Reaction:  Altered mental status   . Quinine Swelling    Social History   Socioeconomic History  . Marital status: Widowed    Spouse name: Not on file  . Number of children: Not on file  . Years of education: Not on file  . Highest education level: Not on file  Occupational History  . Not on file  Social Needs  . Financial resource strain: Not on file  . Food insecurity:    Worry: Not on file    Inability: Not on file  . Transportation needs:    Medical: Not on file    Non-medical: Not on file  Tobacco Use  . Smoking status: Never Smoker  . Smokeless tobacco: Never Used  Substance and Sexual Activity  .  Alcohol use: No  . Drug use: No  . Sexual activity: Yes    Birth control/protection: Post-menopausal  Lifestyle  . Physical activity:    Days per week: Not on file    Minutes per session: Not on file  . Stress: Not on file  Relationships  . Social connections:    Talks on phone: Not on file    Gets together: Not on file    Attends religious service: Not on file    Active member of club or organization: Not on file    Attends meetings of clubs or organizations: Not on file    Relationship status: Not on file  . Intimate partner violence:    Fear of current or ex partner: Not on file    Emotionally abused: Not on file    Physically abused: Not on file    Forced sexual activity: Not on file  Other Topics  Concern  . Not on file  Social History Narrative  . Not on file     Review of Systems: General: negative for chills, fever, night sweats or weight changes.  Cardiovascular: negative for chest pain, dyspnea on exertion, edema, orthopnea, palpitations, paroxysmal nocturnal dyspnea or shortness of breath Dermatological: negative for rash Respiratory: negative for cough or wheezing Urologic: negative for hematuria Abdominal: negative for nausea, vomiting, diarrhea, bright red blood per rectum, melena, or hematemesis Neurologic: negative for visual changes, syncope, or dizziness All other systems reviewed and are otherwise negative except as noted above.    Blood pressure (!) 138/58, pulse (!) 50, weight 127 lb 9.6 oz (57.9 kg).  General appearance: alert and no distress Neck: no adenopathy, no JVD, supple, symmetrical, trachea midline, thyroid not enlarged, symmetric, no tenderness/mass/nodules and Right carotid bruit Lungs: clear to auscultation bilaterally Heart: regular rate and rhythm, S1, S2 normal, no murmur, click, rub or gallop Extremities: extremities normal, atraumatic, no cyanosis or edema Pulses: 2+ and symmetric Skin: Skin color, texture, turgor normal. No rashes or lesions Neurologic: Alert and oriented X 3, normal strength and tone. Normal symmetric reflexes. Normal coordination and gait  EKG sinus bradycardia 50 with nonspecific ST and T wave changes.  I personally reviewed that EKG.  ASSESSMENT AND PLAN:   CAD History of CAD status post bypass grafting in 1999.  She was complaining of some nitrate responsive chest pain 01/14/2017 which was entirely normal .  She has had no recurrent symptoms.  Essential hypertension History of essential hypertension her blood pressure measured today at 138/58.  She is on amlodipine, metoprolol .  Continue current meds at current dosing.  Hyperlipidemia History of hyperlipidemia on statin therapy.  Shortness of breath Planes of  shortness of breath 2D echocardiogram 01/08/2017 normal LV systolic function, grade 1 diastolic dysfunction mild pulmonary hypertension      Runell Gess MD [facility], [facility] 08/05/2017 3:45 PM

## 2017-08-05 NOTE — Assessment & Plan Note (Signed)
Planes of shortness of breath 2D echocardiogram 01/08/2017 normal LV systolic function, grade 1 diastolic dysfunction mild pulmonary hypertension

## 2017-08-05 NOTE — Patient Instructions (Signed)
Your physician wants you to follow-up in: ONE YEAR WITH DR BERRY You will receive a reminder letter in the mail two months in advance. If you don't receive a letter, please call our office to schedule the follow-up appointment.   If you need a refill on your cardiac medications before your next appointment, please call your pharmacy.  

## 2017-08-05 NOTE — Assessment & Plan Note (Addendum)
History of essential hypertension her blood pressure measured today at 138/58.  She is on amlodipine, metoprolol .  Continue current meds at current dosing.

## 2017-08-05 NOTE — Assessment & Plan Note (Signed)
History of CAD status post bypass grafting in 1999.  She was complaining of some nitrate responsive chest pain 01/14/2017 which was entirely normal .  She has had no recurrent symptoms.

## 2017-10-03 ENCOUNTER — Emergency Department (HOSPITAL_COMMUNITY)
Admission: EM | Admit: 2017-10-03 | Discharge: 2017-10-03 | Disposition: A | Discharge status: Home / Self Care | Payer: Medicare Other | Attending: Emergency Medicine | Admitting: Emergency Medicine

## 2017-10-03 ENCOUNTER — Encounter (HOSPITAL_COMMUNITY): Payer: Self-pay | Admitting: Emergency Medicine

## 2017-10-03 ENCOUNTER — Emergency Department (HOSPITAL_COMMUNITY): Payer: Medicare Other

## 2017-10-03 ENCOUNTER — Other Ambulatory Visit: Payer: Self-pay

## 2017-10-03 DIAGNOSIS — Z79899 Other long term (current) drug therapy: Secondary | ICD-10-CM | POA: Diagnosis not present

## 2017-10-03 DIAGNOSIS — M546 Pain in thoracic spine: Secondary | ICD-10-CM | POA: Diagnosis present

## 2017-10-03 DIAGNOSIS — Y93E1 Activity, personal bathing and showering: Secondary | ICD-10-CM | POA: Diagnosis not present

## 2017-10-03 DIAGNOSIS — Y92012 Bathroom of single-family (private) house as the place of occurrence of the external cause: Secondary | ICD-10-CM | POA: Diagnosis not present

## 2017-10-03 DIAGNOSIS — S22050A Wedge compression fracture of T5-T6 vertebra, initial encounter for closed fracture: Secondary | ICD-10-CM | POA: Insufficient documentation

## 2017-10-03 DIAGNOSIS — I10 Essential (primary) hypertension: Secondary | ICD-10-CM | POA: Diagnosis not present

## 2017-10-03 DIAGNOSIS — E039 Hypothyroidism, unspecified: Secondary | ICD-10-CM | POA: Insufficient documentation

## 2017-10-03 DIAGNOSIS — Y999 Unspecified external cause status: Secondary | ICD-10-CM | POA: Insufficient documentation

## 2017-10-03 DIAGNOSIS — W1830XA Fall on same level, unspecified, initial encounter: Secondary | ICD-10-CM | POA: Diagnosis not present

## 2017-10-03 DIAGNOSIS — S22000A Wedge compression fracture of unspecified thoracic vertebra, initial encounter for closed fracture: Secondary | ICD-10-CM

## 2017-10-03 DIAGNOSIS — W19XXXA Unspecified fall, initial encounter: Secondary | ICD-10-CM

## 2017-10-03 LAB — BASIC METABOLIC PANEL
Anion gap: 12 (ref 5–15)
BUN: 23 mg/dL (ref 8–23)
CALCIUM: 9.5 mg/dL (ref 8.9–10.3)
CHLORIDE: 103 mmol/L (ref 98–111)
CO2: 27 mmol/L (ref 22–32)
CREATININE: 1.1 mg/dL — AB (ref 0.44–1.00)
GFR, EST AFRICAN AMERICAN: 49 mL/min — AB (ref >60)
GFR, EST NON AFRICAN AMERICAN: 42 mL/min — AB (ref >60)
Glucose, Bld: 110 mg/dL — ABNORMAL HIGH (ref 70–99)
Potassium: 4.1 mmol/L (ref 3.5–5.1)
SODIUM: 142 mmol/L (ref 135–145)

## 2017-10-03 LAB — CBC
HCT: 44.7 % (ref 36.0–46.0)
Hemoglobin: 14.7 g/dL (ref 12.0–15.0)
MCH: 28.9 pg (ref 26.0–34.0)
MCHC: 32.9 g/dL (ref 30.0–36.0)
MCV: 87.8 fL (ref 78.0–100.0)
PLATELETS: 202 10*3/uL (ref 150–400)
RBC: 5.09 MIL/uL (ref 3.87–5.11)
RDW: 14.1 % (ref 11.5–15.5)
WBC: 16.8 10*3/uL — ABNORMAL HIGH (ref 4.0–10.5)

## 2017-10-03 LAB — CBG MONITORING, ED: GLUCOSE-CAPILLARY: 87 mg/dL (ref 70–99)

## 2017-10-03 MED ORDER — ALBUTEROL SULFATE (2.5 MG/3ML) 0.083% IN NEBU
5.0000 mg | INHALATION_SOLUTION | Freq: Once | RESPIRATORY_TRACT | Status: DC
  Filled 2017-10-03: qty 6

## 2017-10-03 MED ORDER — HYDROCODONE-ACETAMINOPHEN 5-325 MG PO TABS: 0.5000 | ORAL_TABLET | Freq: Four times a day (QID) | ORAL | 0 refills | Status: DC | PRN

## 2017-10-03 NOTE — ED Notes (Signed)
Patient transported to X-ray 

## 2017-10-03 NOTE — ED Notes (Signed)
PT RECEIVES BOTOX TREATMENTS FOR SPASTIC DYSPHONIA 2 WEEKS AGO. PT PRESENTS HOARSE HOWEVER STATES THIS IS NORMAL POST BOTOX. RRT MEGAN IS AT BEDSIDE AND EVALUATES PT. PT IS REPOSITIONED FOR COMFORT. PT DECLINES TREATMENT AT THIS TIME. PT IS INFORMED SHE CAN REQUEST TREATMENT AT ANY TIME. PT IS IN NO DISTRESS. FAMILY PRESENT.

## 2017-10-03 NOTE — ED Notes (Signed)
EKG PERFORMED IN TRIAGE 

## 2017-10-03 NOTE — Progress Notes (Signed)
Patient assessed per RT protocol. BBS clear to ascultation and no increased WOB noted. Patient currently on RA with O2 sats 94-95%. Patient has tightening in neck, but has received Botox recently. Patient denies hx of COPD, asthma, or smoking , but takes Symbicort BID at home. Patient encouraged to notify RN if she feels ShOB and would like breathing tx. RT available as needed.

## 2017-10-03 NOTE — ED Provider Notes (Signed)
[facility]-EMERGENCY DEPT Provider Note   CSN: [id number] Arrival date & time: 10/03/17  1403     History   Chief Complaint Chief Complaint  Patient presents with  . Fall       HPI Wendy Wiley is a 84 y.o. female.  HPI Patient presents after a fall.  States earlier today she was getting out of the bathtub and slipped and fell.  Complaining of pain in her left chest and some pain in her anterior chest since then.  No loss conscious.  No lightheadedness or dizziness. No difficulty breathing.  No fevers.  No hemoptysis.  No cough.  No abdominal pain.  Had taken some Tylenol with some relief in the pain. Past Medical History:  Diagnosis Date  . Anginal pain (HCC)   . Anxiety   . Arthritis   . Depression   . GERD (gastroesophageal reflux disease)   . Hypertension   . Hypothyroidism     Patient Active Problem List   Diagnosis Date Noted  . Shortness of breath 01/17/2017  . Essential hypertension 12/31/2016  . Hyperlipidemia 12/31/2016  . GASTRITIS 07/28/2009  . DYSPHONIA, SPASTIC 07/28/2009  . DIARRHEA 07/28/2009  . CAD 05/06/2007  . OTHER DISEASES OF LARYNX 05/06/2007  . GERD 05/06/2007  . DYSPEPSIA 05/06/2007  . DIVERTICULOSIS, MILD 05/06/2007  . HEARTBURN 05/06/2007    Past Surgical History:  Procedure Laterality Date  . APPENDECTOMY     with ovarian cyst removal  . COLONOSCOPY    . CORONARY ARTERY BYPASS GRAFT    . EYE SURGERY     B/L cataract  . OVARIAN CYST REMOVAL    . UPPER GI ENDOSCOPY       OB History   None      Home Medications    Prior to Admission medications   Medication Sig Start Date End Date Taking? Authorizing Provider  amLODipine (NORVASC) 5 MG tablet Take 1 tablet (5 mg total) by mouth daily. 01/17/17   Runell Gess, MD  aspirin EC 81 MG tablet Take 81 mg by mouth at bedtime.     [provider]  budesonide-formoterol (SYMBICORT) 160-4.5 MCG/ACT inhaler Inhale 2 puffs into the lungs as needed  (wheezing).     [provider]  calcium carbonate (OSCAL) 1500 (600 Ca) MG TABS tablet Take 600 mg of elemental calcium by mouth daily with breakfast.     [provider]  escitalopram (LEXAPRO) 5 MG tablet Take 5 mg by mouth at bedtime.    [provider]  ezetimibe (ZETIA) 10 MG tablet Take 10 mg by mouth at bedtime.    [provider]  furosemide (LASIX) 20 MG tablet Take 20 mg by mouth daily. 11/11/16   [provider]  HYDROcodone-acetaminophen (NORCO/VICODIN) 5-325 MG tablet Take 0.5-1 tablets by mouth every 6 (six) hours as needed. 10/03/17   Benjiman Core, MD  levothyroxine (SYNTHROID, LEVOTHROID) 200 MCG tablet Take 200 mcg by mouth daily before breakfast.     [provider]  meclizine (ANTIVERT) 12.5 MG tablet Take 1 tablet (12.5 mg total) by mouth 2 (two) times daily as needed for dizziness. 05/02/15   Lorre Nick, MD  metoprolol tartrate (LOPRESSOR) 25 MG tablet Take 25 mg by mouth 2 (two) times daily.     [provider]  Multiple Vitamins-Minerals (PRESERVISION AREDS 2) CAPS Take 1 capsule by mouth 2 (two) times daily.    [provider]  MYRBETRIQ 25 MG TB24 tablet Take 25  mg by mouth daily.    [provider]  potassium chloride (K-DUR,KLOR-CON) 10 MEQ tablet Take 10 mEq by mouth daily. 11/11/16   [provider]  ranitidine (ZANTAC) 150 MG tablet Take 150 mg by mouth as needed. 11/18/16   [provider]  rosuvastatin (CRESTOR) 20 MG tablet Take 20 mg by mouth at bedtime. 10/23/16   [provider]    Family History History reviewed. No pertinent family history.  Social History Social History   Tobacco Use  . Smoking status: Never Smoker  . Smokeless tobacco: Never Used  Substance Use Topics  . Alcohol use: No  . Drug use: No     Allergies   Ciprofloxacin; Codeine; and Quinine   Review of Systems Review of Systems  Constitutional: Negative for appetite  change.  HENT: Negative for congestion.   Respiratory: Negative for cough.   Cardiovascular: Positive for chest pain.  Gastrointestinal: Negative for abdominal pain.  Genitourinary: Negative for flank pain.  Musculoskeletal: Positive for back pain.  Skin: Negative for wound.  Neurological: Negative for weakness and light-headedness.  Hematological: Negative for adenopathy.  Psychiatric/Behavioral: Negative for confusion.     Physical Exam Updated Vital Signs BP (!) 134/50   Pulse (!) 51   Temp (!) 97.5 F (36.4 C) (Oral)   Resp 16   Ht 4\' 11"  (1.499 m)   Wt 54.4 kg   SpO2 95%   BMI 24.24 kg/m   Physical Exam  Constitutional: She appears well-developed.  HENT:  Head: Normocephalic and atraumatic.  Eyes: EOM are normal.  Neck: Neck supple.  Cardiovascular: Normal rate.  Pulmonary/Chest: Effort normal.  Abdominal: Soft.  Musculoskeletal:  Tender over mid thoracic spine.  No deformity.  No step-off.  Mild tenderness over left posterior ribs.  Skin: Skin is warm.     ED Treatments / Results  Labs (all labs ordered are listed, but only abnormal results are displayed) Labs Reviewed  BASIC METABOLIC PANEL - Abnormal; Notable for the following components:      Result Value   Glucose, Bld 110 (*)    Creatinine, Ser 1.10 (*)    GFR calc non Af Amer 42 (*)    GFR calc Af Amer 49 (*)    All other components within normal limits  CBC - Abnormal; Notable for the following components:   WBC 16.8 (*)    All other components within normal limits  CBG MONITORING, ED    EKG EKG Interpretation  Date/Time:  Friday October 03 2017 14:18:21 EDT Ventricular Rate:  53 PR Interval:    QRS Duration: 99 QT Interval:  492 QTC Calculation: 462 R Axis:   78 Text Interpretation:  Sinus rhythm Short PR interval Baseline wander in lead(s) V1 No significant change since last tracing Confirmed by Benjiman Core [phone]) on 10/03/2017 3:36:17 PM   Radiology Dg Chest 2  View  Result Date: 10/03/2017 CLINICAL DATA:  Fall this morning getting out of the shower. Back pain and chest pain. EXAM: CHEST - 2 VIEW COMPARISON:  12/27/2016 FINDINGS: Atherosclerotic calcification of the aortic arch. Prior CABG. Degenerative sternoclavicular arthropathy bilaterally. Mild levoconvex thoracolumbar scoliosis. There is a compression fracture of the approximate T5 vertebral level, increased from 12/27/2016, with about 50% loss of height. No pleural effusion.  The lungs appear clear. IMPRESSION: 1. Interval compression fracture of a mid upper thoracic vertebral body, about at the T5 level. 2.  Aortic Atherosclerosis (ICD10-I70.0).  Prior CABG. 3. Bilateral degenerative glenohumeral arthropathy. 4. No pneumothorax  or pulmonary contusion identified. Electronically Signed   By: Gaylyn Rong M.D.   On: 10/03/2017 15:58    Procedures Procedures (including critical care time)  Medications Ordered in ED Medications - No data to display   Initial Impression / Assessment and Plan / ED Course  I have reviewed the triage vital signs and the nursing notes.  Pertinent labs & imaging results that were available during my care of the patient were reviewed by me and considered in my medical decision making (see chart for details).     Patient with fall.  Appears to be mechanical.  Has new compression fracture on x-ray.  Does correlate with the tenderness.  Will treat with pain medicine.  Follow-up with PCP as needed.  Patient does have a bradycardia but this appears to be chronic for her.  Final Clinical Impressions(s) / ED Diagnoses   Final diagnoses:  Fall, initial encounter  Closed compression fracture of thoracic vertebra, initial encounter [facility])    ED Discharge Orders         Ordered    HYDROcodone-acetaminophen (NORCO/VICODIN) 5-325 MG tablet  Every 6 hours PRN     10/03/17 1619           Benjiman Core, MD 10/03/17 1619

## 2017-10-03 NOTE — ED Notes (Addendum)
RX TO PICK UP. GINGER-ALE AND PEANUT BUTTER WITH CRACKERS GIVEN AT [facility]

## 2017-10-03 NOTE — ED Triage Notes (Addendum)
Pt fell this morning getting out of the shower, pt denies syncope. Pt c/o mid / lower back pain and sternal chest pain from the fall. Pt took (2) acetaminophen 500mg  at 13:30. Pain 5/10.

## 2017-10-08 ENCOUNTER — Other Ambulatory Visit: Payer: Self-pay | Admitting: Internal Medicine

## 2017-10-08 DIAGNOSIS — T148XXS Other injury of unspecified body region, sequela: Secondary | ICD-10-CM

## 2017-10-10 ENCOUNTER — Ambulatory Visit
Admission: RE | Admit: 2017-10-10 | Discharge: 2017-10-10 | Disposition: A | Discharge status: Home / Self Care | Payer: Medicare Other | Source: Ambulatory Visit | Attending: Internal Medicine | Admitting: Internal Medicine

## 2017-10-10 DIAGNOSIS — T148XXS Other injury of unspecified body region, sequela: Secondary | ICD-10-CM

## 2017-10-16 ENCOUNTER — Other Ambulatory Visit: Payer: Self-pay | Admitting: Cardiovascular Disease

## 2017-11-01 ENCOUNTER — Other Ambulatory Visit: Payer: Self-pay | Admitting: Cardiovascular Disease

## 2017-11-19 ENCOUNTER — Other Ambulatory Visit (HOSPITAL_COMMUNITY): Payer: Self-pay | Admitting: *Deleted

## 2017-11-20 ENCOUNTER — Ambulatory Visit (HOSPITAL_COMMUNITY)
Admission: RE | Admit: 2017-11-20 | Discharge: 2017-11-20 | Disposition: A | Discharge status: Home / Self Care | Payer: Medicare Other | Source: Ambulatory Visit | Attending: Internal Medicine | Admitting: Internal Medicine

## 2017-11-20 MED ORDER — DENOSUMAB 60 MG/ML ~~LOC~~ SOSY: 60.0000 mg | PREFILLED_SYRINGE | Freq: Once | SUBCUTANEOUS | Status: DC

## 2017-11-20 MED ORDER — DENOSUMAB 60 MG/ML ~~LOC~~ SOSY
PREFILLED_SYRINGE | SUBCUTANEOUS | Status: AC
  Filled 2017-11-20: qty 1

## 2017-11-20 NOTE — Progress Notes (Signed)
Pt here for Prolia .  Pt received Aranesp .  Patient and MD notified.  Spoke with [facility] pharmacist [id number] reactions, none to report.  Physician to follow up with pt.

## 2017-11-20 NOTE — Discharge Instructions (Signed)
Denosumab injection °What is this medicine? °DENOSUMAB (den oh sue mab) slows bone breakdown. Prolia is used to treat osteoporosis in women after menopause and in men. Xgeva is used to treat a high calcium level due to cancer and to prevent bone fractures and other bone problems caused by multiple myeloma or cancer bone metastases. Xgeva is also used to treat giant cell tumor of the bone. °This medicine may be used for other purposes; ask your health care provider or pharmacist if you have questions. °COMMON BRAND NAME(S): Prolia, XGEVA °What should I tell my health care provider before I take this medicine? °They need to know if you have any of these conditions: °-dental disease °-having surgery or tooth extraction °-infection °-kidney disease °-low levels of calcium or Vitamin D in the blood °-malnutrition °-on hemodialysis °-skin conditions or sensitivity °-thyroid or parathyroid disease °-an unusual reaction to denosumab, other medicines, foods, dyes, or preservatives °-pregnant or trying to get pregnant °-breast-feeding °How should I use this medicine? °This medicine is for injection under the skin. It is given by a health care professional in a hospital or clinic setting. °If you are getting Prolia, a special MedGuide will be given to you by the pharmacist with each prescription and refill. Be sure to read this information carefully each time. °For Prolia, talk to your pediatrician regarding the use of this medicine in children. Special care may be needed. For Xgeva, talk to your pediatrician regarding the use of this medicine in children. While this drug may be prescribed for children as young as 13 years for selected conditions, precautions do apply. °Overdosage: If you think you have taken too much of this medicine contact a poison control center or emergency room at once. °NOTE: This medicine is only for you. Do not share this medicine with others. °What if I miss a dose? °It is important not to miss your  dose. Call your doctor or health care professional if you are unable to keep an appointment. °What may interact with this medicine? °Do not take this medicine with any of the following medications: °-other medicines containing denosumab °This medicine may also interact with the following medications: °-medicines that lower your chance of fighting infection °-steroid medicines like prednisone or cortisone °This list may not describe all possible interactions. Give your health care provider a list of all the medicines, herbs, non-prescription drugs, or dietary supplements you use. Also tell them if you smoke, drink alcohol, or use illegal drugs. Some items may interact with your medicine. °What should I watch for while using this medicine? °Visit your doctor or health care professional for regular checks on your progress. Your doctor or health care professional may order blood tests and other tests to see how you are doing. °Call your doctor or health care professional for advice if you get a fever, chills or sore throat, or other symptoms of a cold or flu. Do not treat yourself. This drug may decrease your body's ability to fight infection. Try to avoid being around people who are sick. °You should make sure you get enough calcium and vitamin D while you are taking this medicine, unless your doctor tells you not to. Discuss the foods you eat and the vitamins you take with your health care professional. °See your dentist regularly. Brush and floss your teeth as directed. Before you have any dental work done, tell your dentist you are receiving this medicine. °Do not become pregnant while taking this medicine or for 5 months after stopping   it. Talk with your doctor or health care professional about your birth control options while taking this medicine. Women should inform their doctor if they wish to become pregnant or think they might be pregnant. There is a potential for serious side effects to an unborn child. Talk  to your health care professional or pharmacist for more information. What side effects may I notice from receiving this medicine? Side effects that you should report to your doctor or health care professional as soon as possible: -allergic reactions like skin rash, itching or hives, swelling of the face, lips, or tongue -bone pain -breathing problems -dizziness -jaw pain, especially after dental work -redness, blistering, peeling of the skin -signs and symptoms of infection like fever or chills; cough; sore throat; pain or trouble passing urine -signs of low calcium like fast heartbeat, muscle cramps or muscle pain; pain, tingling, numbness in the hands or feet; seizures -unusual bleeding or bruising -unusually weak or tired Side effects that usually do not require medical attention (report to your doctor or health care professional if they continue or are bothersome): -constipation -diarrhea -headache -joint pain -loss of appetite -muscle pain -runny nose -tiredness -upset stomach This list may not describe all possible side effects. Call your doctor for medical advice about side effects. You may report side effects to FDA at 1-800-FDA-1088. Where should I keep my medicine? This medicine is only given in a clinic, doctor's office, or other health care setting and will not be stored at home. NOTE: This sheet is a summary. It may not cover all possible information. If you have questions about this medicine, talk to your doctor, pharmacist, or health care provider.  2018 Elsevier/Gold Standard (2016-02-06 19:17:21)

## 2017-12-18 ENCOUNTER — Other Ambulatory Visit (HOSPITAL_COMMUNITY): Payer: Self-pay

## 2017-12-19 ENCOUNTER — Ambulatory Visit (HOSPITAL_COMMUNITY)
Admission: RE | Admit: 2017-12-19 | Discharge: 2017-12-19 | Disposition: A | Discharge status: Home / Self Care | Payer: Medicare Other | Source: Ambulatory Visit | Attending: Internal Medicine | Admitting: Internal Medicine

## 2017-12-19 DIAGNOSIS — M810 Age-related osteoporosis without current pathological fracture: Secondary | ICD-10-CM | POA: Diagnosis not present

## 2017-12-19 MED ORDER — DENOSUMAB 60 MG/ML ~~LOC~~ SOSY
PREFILLED_SYRINGE | SUBCUTANEOUS | Status: AC
  Filled 2017-12-19: qty 1

## 2017-12-19 MED ORDER — DENOSUMAB 60 MG/ML ~~LOC~~ SOSY
60.0000 mg | PREFILLED_SYRINGE | Freq: Once | SUBCUTANEOUS | Status: AC
  Administered 2017-12-19: 60 mg via SUBCUTANEOUS

## 2018-01-31 ENCOUNTER — Inpatient Hospital Stay (HOSPITAL_COMMUNITY)
Admission: EM | Admit: 2018-01-31 | Discharge: 2018-02-06 | DRG: 202 | Disposition: A | Discharge status: Home Health | Payer: Medicare Other | Attending: Internal Medicine | Admitting: Internal Medicine

## 2018-01-31 ENCOUNTER — Encounter (HOSPITAL_COMMUNITY): Payer: Self-pay

## 2018-01-31 ENCOUNTER — Emergency Department (HOSPITAL_COMMUNITY): Payer: Medicare Other

## 2018-01-31 DIAGNOSIS — Z79891 Long term (current) use of opiate analgesic: Secondary | ICD-10-CM

## 2018-01-31 DIAGNOSIS — J069 Acute upper respiratory infection, unspecified: Secondary | ICD-10-CM | POA: Diagnosis present

## 2018-01-31 DIAGNOSIS — R0602 Shortness of breath: Secondary | ICD-10-CM

## 2018-01-31 DIAGNOSIS — E876 Hypokalemia: Secondary | ICD-10-CM | POA: Diagnosis present

## 2018-01-31 DIAGNOSIS — Z79890 Hormone replacement therapy (postmenopausal): Secondary | ICD-10-CM

## 2018-01-31 DIAGNOSIS — I2583 Coronary atherosclerosis due to lipid rich plaque: Secondary | ICD-10-CM | POA: Diagnosis not present

## 2018-01-31 DIAGNOSIS — J205 Acute bronchitis due to respiratory syncytial virus: Secondary | ICD-10-CM | POA: Diagnosis not present

## 2018-01-31 DIAGNOSIS — I10 Essential (primary) hypertension: Secondary | ICD-10-CM | POA: Diagnosis not present

## 2018-01-31 DIAGNOSIS — E039 Hypothyroidism, unspecified: Secondary | ICD-10-CM | POA: Diagnosis present

## 2018-01-31 DIAGNOSIS — B338 Other specified viral diseases: Secondary | ICD-10-CM | POA: Diagnosis present

## 2018-01-31 DIAGNOSIS — J9601 Acute respiratory failure with hypoxia: Secondary | ICD-10-CM | POA: Diagnosis present

## 2018-01-31 DIAGNOSIS — M4854XA Collapsed vertebra, not elsewhere classified, thoracic region, initial encounter for fracture: Secondary | ICD-10-CM | POA: Diagnosis present

## 2018-01-31 DIAGNOSIS — Z888 Allergy status to other drugs, medicaments and biological substances status: Secondary | ICD-10-CM

## 2018-01-31 DIAGNOSIS — Z885 Allergy status to narcotic agent status: Secondary | ICD-10-CM

## 2018-01-31 DIAGNOSIS — I2510 Atherosclerotic heart disease of native coronary artery without angina pectoris: Secondary | ICD-10-CM

## 2018-01-31 DIAGNOSIS — Z951 Presence of aortocoronary bypass graft: Secondary | ICD-10-CM

## 2018-01-31 DIAGNOSIS — K219 Gastro-esophageal reflux disease without esophagitis: Secondary | ICD-10-CM | POA: Diagnosis present

## 2018-01-31 DIAGNOSIS — Z66 Do not resuscitate: Secondary | ICD-10-CM | POA: Diagnosis present

## 2018-01-31 DIAGNOSIS — Z79899 Other long term (current) drug therapy: Secondary | ICD-10-CM

## 2018-01-31 DIAGNOSIS — Z881 Allergy status to other antibiotic agents status: Secondary | ICD-10-CM

## 2018-01-31 DIAGNOSIS — J45909 Unspecified asthma, uncomplicated: Secondary | ICD-10-CM | POA: Diagnosis present

## 2018-01-31 DIAGNOSIS — E785 Hyperlipidemia, unspecified: Secondary | ICD-10-CM | POA: Diagnosis present

## 2018-01-31 DIAGNOSIS — Z7982 Long term (current) use of aspirin: Secondary | ICD-10-CM

## 2018-01-31 DIAGNOSIS — R062 Wheezing: Secondary | ICD-10-CM

## 2018-01-31 DIAGNOSIS — F329 Major depressive disorder, single episode, unspecified: Secondary | ICD-10-CM | POA: Diagnosis present

## 2018-01-31 DIAGNOSIS — R0603 Acute respiratory distress: Secondary | ICD-10-CM | POA: Diagnosis present

## 2018-01-31 DIAGNOSIS — B974 Respiratory syncytial virus as the cause of diseases classified elsewhere: Secondary | ICD-10-CM | POA: Diagnosis present

## 2018-01-31 LAB — RESPIRATORY PANEL BY PCR
ADENOVIRUS-RVPPCR: NOT DETECTED
Bordetella pertussis: NOT DETECTED
CORONAVIRUS HKU1-RVPPCR: NOT DETECTED
Chlamydophila pneumoniae: NOT DETECTED
Coronavirus 229E: NOT DETECTED
Coronavirus NL63: NOT DETECTED
Coronavirus OC43: NOT DETECTED
INFLUENZA A-RVPPCR: NOT DETECTED
Influenza B: NOT DETECTED
Metapneumovirus: NOT DETECTED
Mycoplasma pneumoniae: NOT DETECTED
PARAINFLUENZA VIRUS 3-RVPPCR: NOT DETECTED
PARAINFLUENZA VIRUS 4-RVPPCR: NOT DETECTED
Parainfluenza Virus 1: NOT DETECTED
Parainfluenza Virus 2: NOT DETECTED
RESPIRATORY SYNCYTIAL VIRUS-RVPPCR: DETECTED — AB
Rhinovirus / Enterovirus: NOT DETECTED

## 2018-01-31 LAB — BASIC METABOLIC PANEL
Anion gap: 10 (ref 5–15)
BUN: 16 mg/dL (ref 8–23)
CO2: 26 mmol/L (ref 22–32)
Calcium: 9.4 mg/dL (ref 8.9–10.3)
Chloride: 104 mmol/L (ref 98–111)
Creatinine, Ser: 1.06 mg/dL — ABNORMAL HIGH (ref 0.44–1.00)
GFR calc Af Amer: 52 mL/min — ABNORMAL LOW (ref >60)
GFR calc non Af Amer: 45 mL/min — ABNORMAL LOW (ref >60)
GLUCOSE: 123 mg/dL — AB (ref 70–99)
Potassium: 3.4 mmol/L — ABNORMAL LOW (ref 3.5–5.1)
SODIUM: 140 mmol/L (ref 135–145)

## 2018-01-31 LAB — CBC WITH DIFFERENTIAL/PLATELET
ABS IMMATURE GRANULOCYTES: 0.03 10*3/uL (ref 0.00–0.07)
Basophils Absolute: 0.1 10*3/uL (ref 0.0–0.1)
Basophils Relative: 1 %
EOS ABS: 0.1 10*3/uL (ref 0.0–0.5)
EOS PCT: 1 %
HEMATOCRIT: 48.9 % — AB (ref 36.0–46.0)
Hemoglobin: 14.9 g/dL (ref 12.0–15.0)
IMMATURE GRANULOCYTES: 0 %
Lymphocytes Relative: 16 %
Lymphs Abs: 1.7 10*3/uL (ref 0.7–4.0)
MCH: 26.7 pg (ref 26.0–34.0)
MCHC: 30.5 g/dL (ref 30.0–36.0)
MCV: 87.6 fL (ref 80.0–100.0)
MONO ABS: 1.2 10*3/uL — AB (ref 0.1–1.0)
MONOS PCT: 12 %
NEUTROS ABS: 7.4 10*3/uL (ref 1.7–7.7)
NEUTROS PCT: 70 %
Platelets: 189 10*3/uL (ref 150–400)
RBC: 5.58 MIL/uL — ABNORMAL HIGH (ref 3.87–5.11)
RDW: 15.4 % (ref 11.5–15.5)
WBC: 10.6 10*3/uL — AB (ref 4.0–10.5)
nRBC: 0 % (ref 0.0–0.2)

## 2018-01-31 LAB — I-STAT TROPONIN, ED: Troponin i, poc: 0 ng/mL (ref 0.00–0.08)

## 2018-01-31 LAB — MAGNESIUM: Magnesium: 2.1 mg/dL (ref 1.7–2.4)

## 2018-01-31 LAB — BRAIN NATRIURETIC PEPTIDE: B Natriuretic Peptide: 122.3 pg/mL — ABNORMAL HIGH (ref 0.0–100.0)

## 2018-01-31 MED ORDER — METHYLPREDNISOLONE SODIUM SUCC 125 MG IJ SOLR
60.0000 mg | Freq: Three times a day (TID) | INTRAMUSCULAR | Status: DC
  Administered 2018-01-31 – 2018-02-02 (×5): 60 mg via INTRAVENOUS
  Filled 2018-01-31 (×5): qty 2

## 2018-01-31 MED ORDER — GUAIFENESIN ER 600 MG PO TB12
1200.0000 mg | ORAL_TABLET | Freq: Two times a day (BID) | ORAL | Status: DC
  Administered 2018-01-31 – 2018-02-06 (×12): 1200 mg via ORAL
  Filled 2018-01-31 (×12): qty 2

## 2018-01-31 MED ORDER — SODIUM CHLORIDE 0.9 % IV SOLN: 500.0000 mg | INTRAVENOUS | Status: DC

## 2018-01-31 MED ORDER — ALBUTEROL (5 MG/ML) CONTINUOUS INHALATION SOLN
10.0000 mg/h | INHALATION_SOLUTION | RESPIRATORY_TRACT | Status: DC
  Administered 2018-01-31: 10 mg/h via RESPIRATORY_TRACT
  Filled 2018-01-31: qty 20

## 2018-01-31 MED ORDER — METHYLPREDNISOLONE SODIUM SUCC 125 MG IJ SOLR
125.0000 mg | Freq: Once | INTRAMUSCULAR | Status: AC
  Administered 2018-01-31: 125 mg via INTRAVENOUS
  Filled 2018-01-31: qty 2

## 2018-01-31 MED ORDER — LEVALBUTEROL HCL 0.63 MG/3ML IN NEBU
0.6300 mg | INHALATION_SOLUTION | Freq: Four times a day (QID) | RESPIRATORY_TRACT | Status: DC
  Administered 2018-02-01 – 2018-02-02 (×7): 0.63 mg via RESPIRATORY_TRACT
  Filled 2018-01-31 (×8): qty 3

## 2018-01-31 MED ORDER — ALBUTEROL SULFATE (2.5 MG/3ML) 0.083% IN NEBU
5.0000 mg | INHALATION_SOLUTION | Freq: Once | RESPIRATORY_TRACT | Status: AC
  Administered 2018-01-31: 5 mg via RESPIRATORY_TRACT
  Filled 2018-01-31: qty 6

## 2018-01-31 MED ORDER — IPRATROPIUM BROMIDE 0.02 % IN SOLN
0.5000 mg | RESPIRATORY_TRACT | Status: DC
  Administered 2018-01-31 (×2): 0.5 mg via RESPIRATORY_TRACT
  Filled 2018-01-31 (×2): qty 2.5

## 2018-01-31 MED ORDER — IPRATROPIUM-ALBUTEROL 0.5-2.5 (3) MG/3ML IN SOLN: 10.0000 mL | RESPIRATORY_TRACT | Status: DC

## 2018-01-31 NOTE — ED Provider Notes (Signed)
[facility] Provider Note   CSN: [id number] Arrival date & time: 01/31/18  1346     History   Chief Complaint No chief complaint on file.   HPI Wendy Wiley is a 85 y.o. female.  HPI   Patient is a 85 year old female with a past medical history of CAD status post CABG, HTN, hypothyroidism, GERD, and depression who presents for evaluation of 3 to 4 days of cough and congestion associated with wheezing and shortness of breath.  Patient denies any headache, earache, sore throat, chest pain, vomiting, diarrhea, dysuria, extremity weakness, rash, or other acute complaints.  States she takes Symbicort for chronic shortness of breath but denies any history of COPD or asthma.  Denies being on oxygen at home.  Denies prior similar episodes.  Denies alleviating or aggravating factors.  Denies sick contacts or recent travel out of [place] in the past month.  Past Medical History:  Diagnosis Date  . Anginal pain (HCC)   . Anxiety   . Arthritis   . Depression   . GERD (gastroesophageal reflux disease)   . Hypertension   . Hypothyroidism     Patient Active Problem List   Diagnosis Date Noted  . URI (upper respiratory infection) 01/31/2018  . Reactive airway disease 01/31/2018  . Shortness of breath 01/17/2017  . Essential hypertension 12/31/2016  . Hyperlipidemia 12/31/2016  . GASTRITIS 07/28/2009  . DYSPHONIA, SPASTIC 07/28/2009  . DIARRHEA 07/28/2009  . Coronary atherosclerosis 05/06/2007  . OTHER DISEASES OF LARYNX 05/06/2007  . GERD 05/06/2007  . DYSPEPSIA 05/06/2007  . DIVERTICULOSIS, MILD 05/06/2007  . HEARTBURN 05/06/2007    Past Surgical History:  Procedure Laterality Date  . APPENDECTOMY     with ovarian cyst removal  . COLONOSCOPY    . CORONARY ARTERY BYPASS GRAFT    . EYE SURGERY     B/L cataract  . OVARIAN CYST REMOVAL    . UPPER GI ENDOSCOPY       OB History   No obstetric history on file.      Home  Medications    Prior to Admission medications   Medication Sig Start Date End Date Taking? Authorizing Provider  amLODipine (NORVASC) 5 MG tablet TAKE 1 TABLET(5 MG) BY MOUTH DAILY Patient taking differently: Take 5 mg by mouth daily.  11/16/17  Yes Runell GessBerry, Jonathan J, MD  aspirin EC 81 MG tablet Take 81 mg by mouth at bedtime.    Yes [provider]  budesonide-formoterol (SYMBICORT) 160-4.5 MCG/ACT inhaler Inhale 2 puffs into the lungs as needed (wheezing).    Yes [provider]  escitalopram (LEXAPRO) 5 MG tablet Take 5 mg by mouth at bedtime.   Yes [provider]  ezetimibe (ZETIA) 10 MG tablet Take 10 mg by mouth at bedtime.   Yes [provider]  levothyroxine (SYNTHROID, LEVOTHROID) 200 MCG tablet Take 200 mcg by mouth daily before breakfast.    Yes [provider]  metoprolol tartrate (LOPRESSOR) 25 MG tablet Take 25 mg by mouth 2 (two) times daily.    Yes [provider]  Multiple Vitamins-Minerals (PRESERVISION AREDS 2) CAPS Take 1 capsule by mouth 2 (two) times daily.   Yes [provider]  MYRBETRIQ 25 MG TB24 tablet Take 25 mg by mouth daily.   Yes [provider]  potassium chloride (K-DUR,KLOR-CON) 10 MEQ tablet Take 10 mEq by mouth daily. 11/11/16  Yes [provider]  HYDROcodone-acetaminophen (NORCO/VICODIN) 5-325 MG tablet Take 0.5-1  tablets by mouth every 6 (six) hours as needed. Patient not taking: Reported on 01/31/2018 10/03/17   Benjiman Core, MD  meclizine (ANTIVERT) 12.5 MG tablet Take 1 tablet (12.5 mg total) by mouth 2 (two) times daily as needed for dizziness. Patient not taking: Reported on 01/31/2018 05/02/15   Lorre Nick, MD    Family History No family history on file.  Social History Social History   Tobacco Use  . Smoking status: Never Smoker  . Smokeless tobacco: Never Used  Substance Use Topics  . Alcohol use: No  . Drug use: No     Allergies   Ciprofloxacin;  Codeine; and Quinine   Review of Systems Review of Systems  Constitutional: Positive for fatigue. Negative for chills and fever.  HENT: Positive for congestion. Negative for ear pain and sore throat.   Eyes: Negative for pain and visual disturbance.  Respiratory: Positive for cough, shortness of breath and wheezing.   Cardiovascular: Negative for chest pain and palpitations.  Gastrointestinal: Negative for abdominal pain and vomiting.  Genitourinary: Negative for dysuria and hematuria.  Musculoskeletal: Negative for arthralgias and back pain.  Skin: Negative for color change and rash.  Neurological: Negative for seizures and syncope.  All other systems reviewed and are negative.    Physical Exam Updated Vital Signs BP (!) 159/53   Pulse (!) 123   Temp 98.1 F (36.7 C) (Oral)   Resp (!) 22   SpO2 94%   Physical Exam Vitals signs and nursing note reviewed.  Constitutional:      General: She is not in acute distress.    Appearance: She is well-developed.  HENT:     Head: Normocephalic and atraumatic.     Right Ear: External ear normal.     Left Ear: External ear normal.     Nose: Nose normal.     Mouth/Throat:     Mouth: Mucous membranes are moist.  Eyes:     Conjunctiva/sclera: Conjunctivae normal.  Neck:     Musculoskeletal: Neck supple. No neck rigidity.  Cardiovascular:     Rate and Rhythm: Normal rate and regular rhythm.     Pulses: Normal pulses.     Heart sounds: No murmur.  Pulmonary:     Effort: Pulmonary effort is normal. No respiratory distress.     Breath sounds: Wheezing present.  Abdominal:     General: There is no distension.     Palpations: Abdomen is soft.     Tenderness: There is no abdominal tenderness.  Musculoskeletal:     Right lower leg: No edema.     Left lower leg: No edema.  Skin:    General: Skin is warm and dry.     Capillary Refill: Capillary refill takes less than 2 seconds.  Neurological:     General: No focal deficit present.       Mental Status: She is alert and oriented to person, place, and time.      ED Treatments / Results  Labs (all labs ordered are listed, but only abnormal results are displayed) Labs Reviewed  CBC WITH DIFFERENTIAL/PLATELET - Abnormal; Notable for the following components:      Result Value   WBC 10.6 (*)    RBC 5.58 (*)    HCT 48.9 (*)    Monocytes Absolute 1.2 (*)    All other components within normal limits  BASIC METABOLIC PANEL - Abnormal; Notable for the following components:   Potassium 3.4 (*)    Glucose, Bld 123 (*)  Creatinine, Ser 1.06 (*)    GFR calc non Af Amer 45 (*)    GFR calc Af Amer 52 (*)    All other components within normal limits  BRAIN NATRIURETIC PEPTIDE - Abnormal; Notable for the following components:   B Natriuretic Peptide 122.3 (*)    All other components within normal limits  RESPIRATORY PANEL BY PCR  MAGNESIUM  I-STAT TROPONIN, ED    EKG None  Radiology Dg Chest 2 View  Result Date: 01/31/2018 CLINICAL DATA:  Short of breath, wheezing, and productive cough for 2 weeks. EXAM: CHEST - 2 VIEW COMPARISON:  10/03/2017 FINDINGS: Prior CABG. Atherosclerotic calcification of the aortic arch. Degenerative glenohumeral arthropathy, right greater than left. Old right anterior rib deformities. Heart size within normal limits. For thoracic compression fractures as shown on 10/10/2017. Airway thickening is present, suggesting bronchitis or reactive airways disease. Large lung volumes with some flattening of the diaphragms on the lateral projection, potentially from air trapping. IMPRESSION: 1. Airway thickening is present, suggesting bronchitis or reactive airways disease. No airspace opacity identified. 2. Upper thoracic compression fractures similar to those shown on MRI. 3.  Aortic Atherosclerosis (ICD10-I70.0).  Prior CABG. Electronically Signed   By: Gaylyn RongWalter  Liebkemann M.D.   On: 01/31/2018 15:03    Procedures Procedures (including critical care  time)  Medications Ordered in ED Medications  ipratropium (ATROVENT) nebulizer solution 0.5 mg (0.5 mg Nebulization New Bag/Given 01/31/18 2211)  guaiFENesin (MUCINEX) 12 hr tablet 1,200 mg (1,200 mg Oral Given 01/31/18 2209)  levalbuterol (XOPENEX) nebulizer solution 0.63 mg (has no administration in time range)  methylPREDNISolone sodium succinate (SOLU-MEDROL) 125 mg/2 mL injection 60 mg (60 mg Intravenous Given 01/31/18 2203)  albuterol (PROVENTIL) (2.5 MG/3ML) 0.083% nebulizer solution 5 mg (5 mg Nebulization Given 01/31/18 1421)  methylPREDNISolone sodium succinate (SOLU-MEDROL) 125 mg/2 mL injection 125 mg (125 mg Intravenous Given 01/31/18 1541)     Initial Impression / Assessment and Plan / ED Course  I have reviewed the triage vital signs and the nursing notes.  Pertinent labs & imaging results that were available during my care of the patient were reviewed by me and considered in my medical decision making (see chart for details).     Patient is a 85 year old female who presents above-stated history exam.  On presentation patient is afebrile stable vital signs on 3 L nasal cannula..  When trialed off nasal cannula patient's O2 sats were noted to decrease to 90%.  Exam as above remarkable for bilateral wheezing.  Low suspicion for ACS given patient denies any chest pain and ECG shows a normal sinus rhythm with a ventricular rate of 82 with a QTC of 48, normal axis, and nonspecific T and ST wave abnormalities that do not appear significantly changed from prior tracings.  In addition troponin is undetectable.  Chest x-ray does not show focal consolidation to suggest pneumonia, pulmonary edema that would should be suggestive of acute heart failure, pneumothorax, or other acute intrathoracic abnormality.  In addition patient's BNP is 122.3 and her CBC shows WBCs of 10.6.  Low suspicion for symptomatic anemia given hemoglobin of 14.9.  Platelets of 189.  Patient's BMP shows a mildly decreased GFR  without any significant electrolyte or other metabolic abnormalities.  Patient given breathing treatments and IV steroids in the emergency department RVP is sent.  On reassessment approximate 1 hour after breathing treatments patient was still audibly wheezing.  Patient admitted in stable condition for further evaluation and management of likely viral bronchitis with  new O2 requirement and persistent auditory wheezing after breathing treatments.  Final Clinical Impressions(s) / ED Diagnoses   Final diagnoses:  Wheezing  Shortness of breath    ED Discharge Orders    None       Antoine Primas, MD 01/31/18 1950    Blane Ohara, MD 02/01/18 0028

## 2018-01-31 NOTE — ED Notes (Signed)
ORDERED DIET TRAY FOR PT  

## 2018-01-31 NOTE — ED Triage Notes (Signed)
Patient complains of 4 days of cough and congestion with wheezing. States that the cough is worse at night. [facility]. denies pain

## 2018-01-31 NOTE — H&P (Signed)
TR[facility] H&P   Patient Demographics:    Wendy Wiley, is a 85 y.o. female  MRN: [id number]   DOB - Apr 04, 1922  Admit Date - 01/31/2018  Outpatient Primary MD for the patient is Rodrigo RanPerini, Mark, MD  Referring MD/NP/PA: Dr Katrinka BlazingSmith  Patient coming from: Home  No chief complaint on file.     HPI:    Wendy Stadethel Loureiro  is a 85 y.o. female, with past medical history of CAD status post CABG, hypertension, hypothyroidism, depression, GERD, lives home by herself, presents with complaints of wheezing for 3 days, recent patient reports upper respiratory illness for last 10 days, including cough, nonproductive, congestion, runny nose, he denies fever, chills, generalized body ache, she denies any sick contacts, but over last 3 days he did develop some dyspnea, and wheezing, she denies any history of COPD or asthma in the past, [facility] transferred here to ED giving a profile wheezing,. -In ED patient with significant wheezing, but no hypoxia, no significant lab abnormality, chest x-ray with no acute infectious process, she did receive continuous neb albuterol for 1 hour, after that she became sinus tachycardic, is afebrile, given her profound wheezing I called to admit    Review of systems:    In addition to the HPI above,  No Fever-chills, No Headache, No changes with Vision or hearing, No problems swallowing food or Liquids, No Chest pain, just with cough, shortness of breath, runny nose and congestion No Abdominal pain, No Nausea or Vommitting, Bowel movements are regular, No Blood in stool or Urine, No dysuria, No new skin rashes or bruises, No new joints pains-aches,  No new weakness, tingling, numbness in any extremity, No recent weight gain or loss, No polyuria, polydypsia or polyphagia, No significant Mental Stressors.  A full 10 point Review of Systems was done, except as  stated above, all other Review of Systems were negative.   With Past History of the following :    Past Medical History:  Diagnosis Date  . Anginal pain (HCC)   . Anxiety   . Arthritis   . Depression   . GERD (gastroesophageal reflux disease)   . Hypertension   . Hypothyroidism       Past Surgical History:  Procedure Laterality Date  . APPENDECTOMY     with ovarian cyst removal  . COLONOSCOPY    . CORONARY ARTERY BYPASS GRAFT    . EYE SURGERY     B/L cataract  . OVARIAN CYST REMOVAL    . UPPER GI ENDOSCOPY        Social History:     Social History   Tobacco Use  . Smoking status: Never Smoker  . Smokeless tobacco: Never Used  Substance Use Topics  . Alcohol use: No     Lives -home  Mobility -walker    Family History :   Patient denies any  family history of asthma or COPD   Home Medications:   Prior to Admission medications   Medication Sig Start Date End Date Taking? Authorizing Provider  amLODipine (NORVASC) 5 MG tablet TAKE 1 TABLET(5 MG) BY MOUTH DAILY Patient taking differently: Take 5 mg by mouth daily.  11/16/17  Yes Runell Gess, MD  aspirin EC 81 MG tablet Take 81 mg by mouth at bedtime.    Yes [provider]  budesonide-formoterol (SYMBICORT) 160-4.5 MCG/ACT inhaler Inhale 2 puffs into the lungs as needed (wheezing).    Yes [provider]  escitalopram (LEXAPRO) 5 MG tablet Take 5 mg by mouth at bedtime.   Yes [provider]  ezetimibe (ZETIA) 10 MG tablet Take 10 mg by mouth at bedtime.   Yes [provider]  levothyroxine (SYNTHROID, LEVOTHROID) 200 MCG tablet Take 200 mcg by mouth daily before breakfast.    Yes [provider]  metoprolol tartrate (LOPRESSOR) 25 MG tablet Take 25 mg by mouth 2 (two) times daily.    Yes [provider]  Multiple Vitamins-Minerals (PRESERVISION AREDS 2) CAPS Take 1 capsule by mouth 2 (two) times daily.   Yes [provider]  MYRBETRIQ 25 MG  TB24 tablet Take 25 mg by mouth daily.   Yes [provider]  potassium chloride (K-DUR,KLOR-CON) 10 MEQ tablet Take 10 mEq by mouth daily. 11/11/16  Yes [provider]  HYDROcodone-acetaminophen (NORCO/VICODIN) 5-325 MG tablet Take 0.5-1 tablets by mouth every 6 (six) hours as needed. Patient not taking: Reported on 01/31/2018 10/03/17   Benjiman Core, MD  meclizine (ANTIVERT) 12.5 MG tablet Take 1 tablet (12.5 mg total) by mouth 2 (two) times daily as needed for dizziness. Patient not taking: Reported on 01/31/2018 05/02/15   Lorre Nick, MD     Allergies:     Allergies  Allergen Reactions  . Ciprofloxacin Other (See Comments)    Reaction:  Unknown   . Codeine Other (See Comments)    Reaction:  Altered mental status   . Quinine Swelling     Physical Exam:   Vitals  Blood pressure 130/74, pulse (!) 144, temperature 98.1 F (36.7 C), temperature source Oral, resp. rate (!) 22, SpO2 93 %.   1. General frail elderly female, laying in bed, with some increased work of breathing, with very audible wheezing  2. Normal affect and insight, Not Suicidal or Homicidal, Awake Alert, Oriented X 3.  3. No F.N deficits, ALL C.Nerves Intact, Strength 5/5 all 4 extremities, Sensation intact all 4 extremities, Plantars down going.  4. Ears and Eyes appear Normal, Conjunctivae clear, PERRLA. Moist Oral Mucosa.  5. Supple Neck, No JVD, No cervical lymphadenopathy appriciated, No Carotid Bruits.  6. Symmetrical Chest wall movement, fair air entry bilaterally, diffuse audible wheezing, with some use of accessory muscles  7.  Tachycardic, No Gallops, Rubs or Murmurs, No Parasternal Heave.  Heart rate sinus tachycardic on telemetry in the 140s  8. Positive Bowel Sounds, Abdomen Soft, No tenderness, No organomegaly appriciated,No rebound -guarding or rigidity.  9.  No Cyanosis, Normal Skin Turgor, No Skin Rash or Bruise.  10. Good muscle tone,  joints appear normal , no  effusions, Normal ROM.  11. No Palpable Lymph Nodes in Neck or Axillae     Data Review:    CBC Recent Labs  Lab 01/31/18 1544  WBC 10.6*  HGB 14.9  HCT 48.9*  PLT 189  MCV 87.6  MCH 26.7  MCHC 30.5  RDW 15.4  LYMPHSABS  1.7  MONOABS 1.2*  EOSABS 0.1  BASOSABS 0.1   ------------------------------------------------------------------------------------------------------------------  Chemistries  Recent Labs  Lab 01/31/18 1544  NA 140  K 3.4*  CL 104  CO2 26  GLUCOSE 123*  BUN 16  CREATININE 1.06*  CALCIUM 9.4  MG 2.1   ------------------------------------------------------------------------------------------------------------------ CrCl cannot be calculated (Unknown ideal weight.). ------------------------------------------------------------------------------------------------------------------ No results for input(s): TSH, T4TOTAL, T3FREE, THYROIDAB in the last 72 hours.  Invalid input(s): FREET3  Coagulation profile No results for input(s): INR, PROTIME in the last 168 hours. ------------------------------------------------------------------------------------------------------------------- No results for input(s): DDIMER in the last 72 hours. -------------------------------------------------------------------------------------------------------------------  Cardiac Enzymes No results for input(s): CKMB, TROPONINI, MYOGLOBIN in the last 168 hours.  Invalid input(s): CK ------------------------------------------------------------------------------------------------------------------    Component Value Date/Time   BNP 122.3 (H) 01/31/2018 1544     ---------------------------------------------------------------------------------------------------------------  Urinalysis No results found for: COLORURINE, APPEARANCEUR, LABSPEC, PHURINE, GLUCOSEU, HGBUR, BILIRUBINUR, KETONESUR, PROTEINUR, UROBILINOGEN, NITRITE,  LEUKOCYTESUR  ----------------------------------------------------------------------------------------------------------------   Imaging Results:    Dg Chest 2 View  Result Date: 01/31/2018 CLINICAL DATA:  Short of breath, wheezing, and productive cough for 2 weeks. EXAM: CHEST - 2 VIEW COMPARISON:  10/03/2017 FINDINGS: Prior CABG. Atherosclerotic calcification of the aortic arch. Degenerative glenohumeral arthropathy, right greater than left. Old right anterior rib deformities. Heart size within normal limits. For thoracic compression fractures as shown on 10/10/2017. Airway thickening is present, suggesting bronchitis or reactive airways disease. Large lung volumes with some flattening of the diaphragms on the lateral projection, potentially from air trapping. IMPRESSION: 1. Airway thickening is present, suggesting bronchitis or reactive airways disease. No airspace opacity identified. 2. Upper thoracic compression fractures similar to those shown on MRI. 3.  Aortic Atherosclerosis (ICD10-I70.0).  Prior CABG. Electronically Signed   By: Gaylyn RongWalter  Liebkemann M.D.   On: 01/31/2018 15:03    My personal review of EKG: Rhythm NSR, Rate  82 /min, QTc 488 , no Acute ST changes   Assessment & Plan:    Active Problems:   Coronary atherosclerosis   Essential hypertension   Hyperlipidemia   Shortness of breath   URI (upper respiratory infection)   Reactive airway disease   URI with reactive airway disease -Patient with diffuse audible wheezing, increased work of breathing, most likely in setting of URI, viral panel was sent in ED, so she will be admitted, I will start on IV Solu-Medrol 80 mg every 8 hours, and PPI meanwhile, given his sinus tachycardia to albuterol, I will continue with Xopenex/ipratropium every 6 hours scheduled, and PRN Xopenex, I will start on doxycycline.  Sinus tachycardia -Heart rate in the 140s on telemetry, this is in the setting of prolonged albuterol treatment, she will be  admitted to telemetry, and no further albuterol treatment, will replace with Xopenex, repeat EKG  Hypertension -Continue with home medicine  CAD -History of CABG, continue with home meds including aspirin, statin, beta-blockers denies any chest pain  Hypothyroidism -Continue Synthroid  Hyperlipidemia -Continue with statin  DVT Prophylaxis Lovenox - SCDs   AM Labs Ordered, also please review Full Orders  Family Communication: Admission, patients condition and plan of care including tests being ordered have been discussed with the patient and son with daughter-in-law who indicate understanding and agree with the plan and Code Status.  Code Status DNR, confirmed by patient  Likely DC to  Home  Condition GUARDED    Consults called: None  Admission status: Observation  Time spent in minutes : 45 minutes   Huey Bienenstockawood Denelle Capurro M.D on 01/31/2018 at 5:29 PM  Between 7am to 7pm - Pager - [phone]. After 7pm go to [email or web address] - password [facility]  [facility] - Office  [phone]

## 2018-01-31 NOTE — ED Provider Notes (Signed)
[facility] EMERGENCY DEPARTMENT Provider Note   CSN: [id number] Arrival date & time: 01/31/18  1346     History   Chief Complaint No chief complaint on file.   HPI Wendy Wiley is a 85 y.o. female.  HPI   Patient is a 85 year old female with past medical history of anxiety, arthritis, depression, GERD, HTN, hypothyroidism, and CAD status post CABG who presents for evaluation of 4 days of cough, congestion, wheezing, and shortness of breath.  Patient denies any fevers chills, headache, chest pain, vomiting, diarrhea, dysuria, extremity pain, rash, or other acute complaints.  States she has been taking Symbicort inhaler at home with minimal improvement in her symptoms.  She denies partial episodes.  Denies any alleviating or aggravating factors.  Denies any history of malignancy, CVA, or PE/DVT.  Past Medical History:  Diagnosis Date  . Anginal pain (HCC)   . Anxiety   . Arthritis   . Depression   . GERD (gastroesophageal reflux disease)   . Hypertension   . Hypothyroidism     Patient Active Problem List   Diagnosis Date Noted  . Shortness of breath 01/17/2017  . Essential hypertension 12/31/2016  . Hyperlipidemia 12/31/2016  . GASTRITIS 07/28/2009  . DYSPHONIA, SPASTIC 07/28/2009  . DIARRHEA 07/28/2009  . CAD 05/06/2007  . OTHER DISEASES OF LARYNX 05/06/2007  . GERD 05/06/2007  . DYSPEPSIA 05/06/2007  . DIVERTICULOSIS, MILD 05/06/2007  . HEARTBURN 05/06/2007    Past Surgical History:  Procedure Laterality Date  . APPENDECTOMY     with ovarian cyst removal  . COLONOSCOPY    . CORONARY ARTERY BYPASS GRAFT    . EYE SURGERY     B/L cataract  . OVARIAN CYST REMOVAL    . UPPER GI ENDOSCOPY       OB History   No obstetric history on file.      Home Medications    Prior to Admission medications   Medication Sig Start Date End Date Taking? Authorizing Provider  amLODipine (NORVASC) 5 MG tablet TAKE 1 TABLET(5 MG) BY MOUTH DAILY Patient  taking differently: Take 5 mg by mouth daily.  11/16/17  Yes Runell Gess, MD  aspirin EC 81 MG tablet Take 81 mg by mouth at bedtime.    Yes [provider]  budesonide-formoterol (SYMBICORT) 160-4.5 MCG/ACT inhaler Inhale 2 puffs into the lungs as needed (wheezing).    Yes [provider]  escitalopram (LEXAPRO) 5 MG tablet Take 5 mg by mouth at bedtime.   Yes [provider]  ezetimibe (ZETIA) 10 MG tablet Take 10 mg by mouth at bedtime.   Yes [provider]  levothyroxine (SYNTHROID, LEVOTHROID) 200 MCG tablet Take 200 mcg by mouth daily before breakfast.    Yes [provider]  metoprolol tartrate (LOPRESSOR) 25 MG tablet Take 25 mg by mouth 2 (two) times daily.    Yes [provider]  Multiple Vitamins-Minerals (PRESERVISION AREDS 2) CAPS Take 1 capsule by mouth 2 (two) times daily.   Yes [provider]  MYRBETRIQ 25 MG TB24 tablet Take 25 mg by mouth daily.   Yes [provider]  potassium chloride (K-DUR,KLOR-CON) 10 MEQ tablet Take 10 mEq by mouth daily. 11/11/16  Yes [provider]  HYDROcodone-acetaminophen (NORCO/VICODIN) 5-325 MG tablet Take 0.5-1 tablets by mouth every 6 (six) hours as needed. Patient not taking: Reported on 01/31/2018 10/03/17   Benjiman Core, MD  meclizine (ANTIVERT) 12.5 MG tablet Take 1 tablet (12.5 mg total)  by mouth 2 (two) times daily as needed for dizziness. Patient not taking: Reported on 01/31/2018 05/02/15   Lorre NickAllen, Anthony, MD    Family History No family history on file.  Social History Social History   Tobacco Use  . Smoking status: Never Smoker  . Smokeless tobacco: Never Used  Substance Use Topics  . Alcohol use: No  . Drug use: No     Allergies   Ciprofloxacin; Codeine; and Quinine   Review of Systems Review of Systems  Constitutional: Positive for fatigue. Negative for chills and fever.  HENT: Negative for ear pain and sore throat.   Eyes: Negative  for pain and visual disturbance.  Respiratory: Positive for cough, chest tightness, shortness of breath and wheezing.   Cardiovascular: Negative for chest pain and palpitations.  Gastrointestinal: Negative for abdominal pain and vomiting.  Genitourinary: Negative for dysuria and hematuria.  Musculoskeletal: Negative for arthralgias and back pain.  Skin: Negative for color change and rash.  Neurological: Negative for seizures and syncope.  All other systems reviewed and are negative.    Physical Exam Updated Vital Signs BP (!) 156/71 (BP Location: Right Arm)   Pulse 69   Temp 98.1 F (36.7 C) (Oral)   Resp 18   SpO2 100%   Physical Exam Vitals signs and nursing note reviewed.  Constitutional:      General: She is not in acute distress.    Appearance: She is well-developed.  HENT:     Head: Normocephalic and atraumatic.     Right Ear: External ear normal.     Left Ear: External ear normal.     Nose: Nose normal.     Mouth/Throat:     Mouth: Mucous membranes are moist.  Eyes:     Conjunctiva/sclera: Conjunctivae normal.  Neck:     Musculoskeletal: Neck supple.  Cardiovascular:     Rate and Rhythm: Normal rate and regular rhythm.     Pulses: Normal pulses.     Heart sounds: No murmur.  Pulmonary:     Effort: Pulmonary effort is normal. No respiratory distress.     Breath sounds: Wheezing present.  Abdominal:     Palpations: Abdomen is soft.     Tenderness: There is no abdominal tenderness.  Skin:    General: Skin is warm and dry.     Capillary Refill: Capillary refill takes less than 2 seconds.  Neurological:     General: No focal deficit present.     Mental Status: She is alert.      ED Treatments / Results  Labs (all labs ordered are listed, but only abnormal results are displayed) Labs Reviewed  CBC WITH DIFFERENTIAL/PLATELET - Abnormal; Notable for the following components:      Result Value   WBC 10.6 (*)    RBC 5.58 (*)    HCT 48.9 (*)    Monocytes  Absolute 1.2 (*)    All other components within normal limits  BASIC METABOLIC PANEL - Abnormal; Notable for the following components:   Potassium 3.4 (*)    Glucose, Bld 123 (*)    Creatinine, Ser 1.06 (*)    GFR calc non Af Amer 45 (*)    GFR calc Af Amer 52 (*)    All other components within normal limits  RESPIRATORY PANEL BY PCR  MAGNESIUM  BRAIN NATRIURETIC PEPTIDE  I-STAT TROPONIN, ED    EKG None  Radiology Dg Chest 2 View  Result Date: 01/31/2018 CLINICAL DATA:  Short of breath,  wheezing, and productive cough for 2 weeks. EXAM: CHEST - 2 VIEW COMPARISON:  10/03/2017 FINDINGS: Prior CABG. Atherosclerotic calcification of the aortic arch. Degenerative glenohumeral arthropathy, right greater than left. Old right anterior rib deformities. Heart size within normal limits. For thoracic compression fractures as shown on 10/10/2017. Airway thickening is present, suggesting bronchitis or reactive airways disease. Large lung volumes with some flattening of the diaphragms on the lateral projection, potentially from air trapping. IMPRESSION: 1. Airway thickening is present, suggesting bronchitis or reactive airways disease. No airspace opacity identified. 2. Upper thoracic compression fractures similar to those shown on MRI. 3.  Aortic Atherosclerosis (ICD10-I70.0).  Prior CABG. Electronically Signed   By: Gaylyn Rong M.D.   On: 01/31/2018 15:03    Procedures Procedures (including critical care time)  Medications Ordered in ED Medications  ipratropium (ATROVENT) nebulizer solution 0.5 mg (0.5 mg Nebulization New Bag/Given 01/31/18 1535)  albuterol (PROVENTIL,VENTOLIN) solution continuous neb (10 mg/hr Nebulization New Bag/Given 01/31/18 1535)  albuterol (PROVENTIL) (2.5 MG/3ML) 0.083% nebulizer solution 5 mg (5 mg Nebulization Given 01/31/18 1421)  methylPREDNISolone sodium succinate (SOLU-MEDROL) 125 mg/2 mL injection 125 mg (125 mg Intravenous Given 01/31/18 1541)     Initial  Impression / Assessment and Plan / ED Course  I have reviewed the triage vital signs and the nursing notes.  Pertinent labs & imaging results that were available during my care of the patient were reviewed by me and considered in my medical decision making (see chart for details).     Patient is a 85 year old female who presents above-stated history exam.  On presentation patient is afebrile stable vital signs on 3 L nasal cannula..  Exam as above remarkable for bilateral wheezing.    ECG shows a NSR with a ventricular rate of 82, QTc of 48, otherwise normal intervals and no signs of acute ischemic change.  Axis is within normal limits.  Given age and presentation of respiratory distress a troponin was obtained.  Troponin is nonelevated.  I have a low suspicion for ACS.  In addition history exam is not consistent with PE.  History exam is not consistent with aortic dissection, sepsis, or meningitis.  Concern for bronchitis given chest x-ray remarkable for diffuse airway thickening.  There is no pneumothorax or focal consolidation that would suggest a pneumonia.  There is no significant cardiomegaly, pleural effusion, or pulmonary edema.  CBC shows a WBC count of 10.6, hemoglobin 14.9, platelets 189.  BMP shows NA 140, K3.4, chloride 104, glucose 123, creatinine 1.06 with an anion gap of 10.  Magnesium is 2.1.  Patient was given continuous albuterol and Atrovent treatments as well as IV steroids for wheezing in the setting of concern for reactive airway disease and viral bronchitis.  Will defer antibiotics at this time.  Patient admitted in stable condition to hospital service for further evaluation and management.  Final Clinical Impressions(s) / ED Diagnoses   Final diagnoses:  Wheezing  Shortness of breath    ED Discharge Orders    None       Antoine Primas, MD 01/31/18 1642    Blane Ohara, MD 02/01/18 0028

## 2018-02-01 ENCOUNTER — Other Ambulatory Visit: Payer: Self-pay

## 2018-02-01 ENCOUNTER — Encounter (HOSPITAL_COMMUNITY): Payer: Self-pay

## 2018-02-01 DIAGNOSIS — E876 Hypokalemia: Secondary | ICD-10-CM | POA: Diagnosis present

## 2018-02-01 DIAGNOSIS — B974 Respiratory syncytial virus as the cause of diseases classified elsewhere: Secondary | ICD-10-CM | POA: Diagnosis present

## 2018-02-01 DIAGNOSIS — B338 Other specified viral diseases: Secondary | ICD-10-CM | POA: Diagnosis present

## 2018-02-01 DIAGNOSIS — R0603 Acute respiratory distress: Secondary | ICD-10-CM | POA: Diagnosis not present

## 2018-02-01 MED ORDER — MOMETASONE FURO-FORMOTEROL FUM 200-5 MCG/ACT IN AERO
2.0000 | INHALATION_SPRAY | Freq: Two times a day (BID) | RESPIRATORY_TRACT | Status: DC
  Administered 2018-02-01 – 2018-02-04 (×6): 2 via RESPIRATORY_TRACT
  Filled 2018-02-01: qty 8.8

## 2018-02-01 MED ORDER — ESCITALOPRAM OXALATE 10 MG PO TABS
5.0000 mg | ORAL_TABLET | Freq: Every day | ORAL | Status: DC
  Administered 2018-02-01 – 2018-02-05 (×5): 5 mg via ORAL
  Filled 2018-02-01 (×5): qty 1

## 2018-02-01 MED ORDER — LEVOTHYROXINE SODIUM 100 MCG PO TABS
200.0000 ug | ORAL_TABLET | Freq: Every day | ORAL | Status: DC
  Administered 2018-02-02 – 2018-02-06 (×5): 200 ug via ORAL
  Filled 2018-02-01 (×5): qty 2

## 2018-02-01 MED ORDER — SODIUM CHLORIDE 0.9 % IV SOLN
INTRAVENOUS | Status: DC
  Administered 2018-02-01 – 2018-02-02 (×2): via INTRAVENOUS

## 2018-02-01 MED ORDER — AMLODIPINE BESYLATE 5 MG PO TABS
5.0000 mg | ORAL_TABLET | Freq: Every day | ORAL | Status: DC
  Administered 2018-02-01 – 2018-02-06 (×6): 5 mg via ORAL
  Filled 2018-02-01 (×6): qty 1

## 2018-02-01 MED ORDER — EZETIMIBE 10 MG PO TABS
10.0000 mg | ORAL_TABLET | Freq: Every day | ORAL | Status: DC
  Administered 2018-02-01 – 2018-02-05 (×5): 10 mg via ORAL
  Filled 2018-02-01 (×5): qty 1

## 2018-02-01 MED ORDER — POTASSIUM CHLORIDE CRYS ER 20 MEQ PO TBCR
40.0000 meq | EXTENDED_RELEASE_TABLET | Freq: Once | ORAL | Status: AC
  Administered 2018-02-01: 40 meq via ORAL
  Filled 2018-02-01: qty 2

## 2018-02-01 MED ORDER — ASPIRIN EC 81 MG PO TBEC
81.0000 mg | DELAYED_RELEASE_TABLET | Freq: Every day | ORAL | Status: DC
  Administered 2018-02-01 – 2018-02-05 (×5): 81 mg via ORAL
  Filled 2018-02-01 (×5): qty 1

## 2018-02-01 MED ORDER — POTASSIUM CHLORIDE CRYS ER 10 MEQ PO TBCR
10.0000 meq | EXTENDED_RELEASE_TABLET | Freq: Every day | ORAL | Status: DC
  Administered 2018-02-01 – 2018-02-06 (×6): 10 meq via ORAL
  Filled 2018-02-01 (×6): qty 1

## 2018-02-01 MED ORDER — ACETAMINOPHEN 325 MG PO TABS: 650.0000 mg | ORAL_TABLET | Freq: Four times a day (QID) | ORAL | Status: DC | PRN

## 2018-02-01 MED ORDER — ENOXAPARIN SODIUM 30 MG/0.3ML ~~LOC~~ SOLN
30.0000 mg | SUBCUTANEOUS | Status: DC
  Administered 2018-02-01 – 2018-02-05 (×5): 30 mg via SUBCUTANEOUS
  Filled 2018-02-01 (×5): qty 0.3

## 2018-02-01 MED ORDER — PROSIGHT PO TABS
1.0000 | ORAL_TABLET | Freq: Every day | ORAL | Status: DC
  Administered 2018-02-01 – 2018-02-06 (×6): 1 via ORAL
  Filled 2018-02-01 (×6): qty 1

## 2018-02-01 MED ORDER — DOXYCYCLINE HYCLATE 100 MG PO TABS
100.0000 mg | ORAL_TABLET | Freq: Two times a day (BID) | ORAL | Status: DC
  Administered 2018-02-01 – 2018-02-05 (×10): 100 mg via ORAL
  Filled 2018-02-01 (×10): qty 1

## 2018-02-01 MED ORDER — ALBUTEROL SULFATE (2.5 MG/3ML) 0.083% IN NEBU: 2.5000 mg | INHALATION_SOLUTION | RESPIRATORY_TRACT | Status: DC | PRN

## 2018-02-01 MED ORDER — ACETAMINOPHEN 650 MG RE SUPP: 650.0000 mg | Freq: Four times a day (QID) | RECTAL | Status: DC | PRN

## 2018-02-01 MED ORDER — PRESERVISION AREDS 2 PO CAPS: 1.0000 | ORAL_CAPSULE | Freq: Two times a day (BID) | ORAL | Status: DC

## 2018-02-01 MED ORDER — IPRATROPIUM BROMIDE 0.02 % IN SOLN
0.5000 mg | Freq: Four times a day (QID) | RESPIRATORY_TRACT | Status: DC
  Administered 2018-02-01 – 2018-02-02 (×6): 0.5 mg via RESPIRATORY_TRACT
  Filled 2018-02-01 (×6): qty 2.5

## 2018-02-01 MED ORDER — MIRABEGRON ER 25 MG PO TB24
25.0000 mg | ORAL_TABLET | Freq: Every day | ORAL | Status: DC
  Administered 2018-02-01 – 2018-02-06 (×6): 25 mg via ORAL
  Filled 2018-02-01 (×6): qty 1

## 2018-02-01 MED ORDER — METOPROLOL TARTRATE 25 MG PO TABS
25.0000 mg | ORAL_TABLET | Freq: Two times a day (BID) | ORAL | Status: DC
  Administered 2018-02-01 – 2018-02-06 (×11): 25 mg via ORAL
  Filled 2018-02-01 (×12): qty 1

## 2018-02-01 NOTE — Progress Notes (Signed)
Wendy Wiley [id number] Admission Data: 02/01/2018 3:06 PM Attending Provider: Joseph Art, DO  ZDG:LOVFIE, Loraine Leriche, MD Consults/ Treatment Team:   Wendy Wiley is a 85 y.o. female patient admitted from ED awake, alert  & orientated  X 3,  DNR, VSS - Blood pressure 140/63, pulse 90, temperature 98.8 F (37.1 C), temperature source Oral, resp. rate 20, SpO2 98 %. on 2 L nasal cannula. Tele # 10 placed and pt is currently running:normal sinus rhythm.   IV site WDL:  wrist right, condition patent and no redness with a transparent dsg that's clean dry and intact.  Allergies:   Allergies  Allergen Reactions  . Ciprofloxacin Other (See Comments)    Reaction:  Unknown   . Codeine Other (See Comments)    Reaction:  Altered mental status   . Quinine Swelling     Past Medical History:  Diagnosis Date  . Anginal pain (HCC)   . Anxiety   . Arthritis   . Depression   . GERD (gastroesophageal reflux disease)   . Hypertension   . Hypothyroidism      Pt orientation to unit, room and routine. Information packet given to patient/family and safety video watched.  Admission INP armband ID verified with patient/family, and in place. SR up x 3, fall risk assessment complete with patient and family verbalizing understanding of risks associated with falls. Pt verbalizes an understanding of how to use the call bell and to call for help before getting out of bed.  Skin, clean-dry- intact without evidence of bruising, or skin tears.   Will cont to monitor and assist as needed.  Lyndal Pulley, RN 02/01/2018 3:06 PM

## 2018-02-01 NOTE — ED Notes (Signed)
BFAST TRAY ORDERED 

## 2018-02-01 NOTE — ED Notes (Addendum)
Pt was able to ambulate to the bathroom with assistance, remaining on oxygen. Pt was notably jittery but not unstable while walking. O2 sats were at 96% when pt returned to her room. Rales and raspy sounds were audible.

## 2018-02-01 NOTE — ED Notes (Signed)
Pt sleeping with her son at bedside

## 2018-02-01 NOTE — ED Notes (Signed)
Pt awake from two of the patients yelling and acting out  Med given to one

## 2018-02-01 NOTE — ED Notes (Signed)
pts son has closed the room door due to all the noise from 2 other patients acting out

## 2018-02-01 NOTE — Progress Notes (Addendum)
[facility] PROGRESS NOTE  Wendy Wiley ZDG:[id number] DOB: Jun 02, 1922 DOA: 01/31/2018 PCP: Rodrigo RanPerini, Mark, MD  Assessment/Plan:  Acute respiratory distress related to RSV infection - Chest xray with Airway thickening is present, suggesting bronchitis or reactive airways disease. No airspace opacity identified. Upper thoracic compression fractures similar to those shown on MRI. - remains raspy and tight sounding.  -continue scheduled neb -continue solumedrol -continue doxy day #2  RSV. Per respiratory panel. Afebrile and non-toxic appearing -see #1  Sinus tachycardia resolved this am. Likely related to  prolonged albuterol treatment. ekg without acute changes. No events on tele.  - Will use xopenex.   Hypertension. Fair control. Home meds include lopressor amlodipine -Continue with home medicine  CAD -History of CABG, continue with home meds including aspirin, statin, beta-blockers denies any chest pain. ekg without acute changes. Trop neg  Hypothyroidism -Continue Synthroid  Hyperlipidemia -Continue with statin  Code Status: dnr Family Communication: son at bedside Disposition Plan: home when ready   Consultants:    Procedures:    Antibiotics:  Doxycycline 01/31/18>>  HPI/Subjective: Admitted with sob, cough, audible wheeze. resp panel + RSV. Improved this am  Complains of "jitters" since continuous neb received upon presentation. Reports feeling like breathing easier  Objective: Vitals:   02/01/18 0745 02/01/18 0800  BP: (!) 116/48 (!) 137/59  Pulse: 96 94  Resp: 17 18  Temp:    SpO2: 98% 99%    Intake/Output Summary (Last 24 hours) at 02/01/2018 [id number] Last data filed at 02/01/2018 0913 Gross per 24 hour  Intake -  Output 150 ml  Net -150 ml   There were no vitals filed for this visit.  Exam:   General:  Somewhat pale, frail in no acute distress sitting up in bed eating pancake  Cardiovascular: rrr no mgr trace LE  edema  Respiratory: mild to moderated increased work of breathing. Able to complete short sentences. Uses abdominal accessory muscles. Poor air movement audible wheezing tight sounding  Abdomen: soft +BS no guarding or rebounding  Musculoskeletal: joints without swelling/erythema   Data Reviewed: Basic Metabolic Panel: Recent Labs  Lab 01/31/18 1544  NA 140  K 3.4*  CL 104  CO2 26  GLUCOSE 123*  BUN 16  CREATININE 1.06*  CALCIUM 9.4  MG 2.1   Liver Function Tests: No results for input(s): AST, ALT, ALKPHOS, BILITOT, PROT, ALBUMIN in the last 168 hours. No results for input(s): LIPASE, AMYLASE in the last 168 hours. No results for input(s): AMMONIA in the last 168 hours. CBC: Recent Labs  Lab 01/31/18 1544  WBC 10.6*  NEUTROABS 7.4  HGB 14.9  HCT 48.9*  MCV 87.6  PLT 189   Cardiac Enzymes: No results for input(s): CKTOTAL, CKMB, CKMBINDEX, TROPONINI in the last 168 hours. BNP (last 3 results) Recent Labs    01/31/18 1544  BNP 122.3*    ProBNP (last 3 results) No results for input(s): PROBNP in the last 8760 hours.  CBG: No results for input(s): GLUCAP in the last 168 hours.  Recent Results (from the past 240 hour(s))  Respiratory Panel by PCR     Status: Abnormal   Collection Time: 01/31/18  5:17 PM  Result Value Ref Range Status   Adenovirus NOT DETECTED NOT DETECTED Final   Coronavirus 229E NOT DETECTED NOT DETECTED Final   Coronavirus HKU1 NOT DETECTED NOT DETECTED Final   Coronavirus NL63 NOT DETECTED NOT DETECTED Final   Coronavirus OC43 NOT DETECTED NOT DETECTED Final   Metapneumovirus NOT DETECTED NOT  DETECTED Final   Rhinovirus / Enterovirus NOT DETECTED NOT DETECTED Final   Influenza A NOT DETECTED NOT DETECTED Final   Influenza B NOT DETECTED NOT DETECTED Final   Parainfluenza Virus 1 NOT DETECTED NOT DETECTED Final   Parainfluenza Virus 2 NOT DETECTED NOT DETECTED Final   Parainfluenza Virus 3 NOT DETECTED NOT DETECTED Final    Parainfluenza Virus 4 NOT DETECTED NOT DETECTED Final   Respiratory Syncytial Virus DETECTED (A) NOT DETECTED Final    Comment: CRITICAL RESULT CALLED TO, READ BACK BY AND VERIFIED WITH: BClois Dupes RN [id number] 01/31/2018 T. TYSOR    Bordetella pertussis NOT DETECTED NOT DETECTED Final   Chlamydophila pneumoniae NOT DETECTED NOT DETECTED Final   Mycoplasma pneumoniae NOT DETECTED NOT DETECTED Final     Studies: Dg Chest 2 View  Result Date: 01/31/2018 CLINICAL DATA:  Short of breath, wheezing, and productive cough for 2 weeks. EXAM: CHEST - 2 VIEW COMPARISON:  10/03/2017 FINDINGS: Prior CABG. Atherosclerotic calcification of the aortic arch. Degenerative glenohumeral arthropathy, right greater than left. Old right anterior rib deformities. Heart size within normal limits. For thoracic compression fractures as shown on 10/10/2017. Airway thickening is present, suggesting bronchitis or reactive airways disease. Large lung volumes with some flattening of the diaphragms on the lateral projection, potentially from air trapping. IMPRESSION: 1. Airway thickening is present, suggesting bronchitis or reactive airways disease. No airspace opacity identified. 2. Upper thoracic compression fractures similar to those shown on MRI. 3.  Aortic Atherosclerosis (ICD10-I70.0).  Prior CABG. Electronically Signed   By: Gaylyn Rong M.D.   On: 01/31/2018 15:03    Scheduled Meds: . guaiFENesin  1,200 mg Oral BID  . levalbuterol  0.63 mg Nebulization Q6H  . methylPREDNISolone (SOLU-MEDROL) injection  60 mg Intravenous Q8H   Continuous Infusions:  Active Problems:   Coronary atherosclerosis   Essential hypertension   Hyperlipidemia   Shortness of breath   URI (upper respiratory infection)   Reactive airway disease    Time spent: 45 minutes    [facility] M NP [facility]  If 7PM-7AM, please contact night-coverage at [email or web address], password [facility] 02/01/2018, 9:22 AM  LOS: 0 days      Patient was seen, examined,treatment plan was discussed with the Advance Practice Provider.  I have personally reviewed the clinical findings, labs, EKG, imaging studies and management of this patient in detail. I have also reviewed the orders written for this patient which were under my direction. I agree with the documentation, as recorded by the Advance Practice Provider.   Wendy Wiley is a 85 y.o. female here with respiratory distress due to RSV.  Sounds tight, wheezing and somewhat stridorous on exam.  Will need continued supportive care until respiratory status improved.     Joseph Art, DO

## 2018-02-02 DIAGNOSIS — R0603 Acute respiratory distress: Secondary | ICD-10-CM | POA: Diagnosis not present

## 2018-02-02 DIAGNOSIS — E7800 Pure hypercholesterolemia, unspecified: Secondary | ICD-10-CM | POA: Diagnosis not present

## 2018-02-02 DIAGNOSIS — J440 Chronic obstructive pulmonary disease with acute lower respiratory infection: Secondary | ICD-10-CM | POA: Diagnosis not present

## 2018-02-02 DIAGNOSIS — J9621 Acute and chronic respiratory failure with hypoxia: Secondary | ICD-10-CM | POA: Diagnosis not present

## 2018-02-02 DIAGNOSIS — M4854XA Collapsed vertebra, not elsewhere classified, thoracic region, initial encounter for fracture: Secondary | ICD-10-CM | POA: Diagnosis present

## 2018-02-02 DIAGNOSIS — E039 Hypothyroidism, unspecified: Secondary | ICD-10-CM | POA: Diagnosis present

## 2018-02-02 DIAGNOSIS — Z66 Do not resuscitate: Secondary | ICD-10-CM | POA: Diagnosis present

## 2018-02-02 DIAGNOSIS — J4540 Moderate persistent asthma, uncomplicated: Secondary | ICD-10-CM | POA: Diagnosis not present

## 2018-02-02 DIAGNOSIS — E785 Hyperlipidemia, unspecified: Secondary | ICD-10-CM | POA: Diagnosis present

## 2018-02-02 DIAGNOSIS — Z881 Allergy status to other antibiotic agents status: Secondary | ICD-10-CM | POA: Diagnosis not present

## 2018-02-02 DIAGNOSIS — Z888 Allergy status to other drugs, medicaments and biological substances status: Secondary | ICD-10-CM | POA: Diagnosis not present

## 2018-02-02 DIAGNOSIS — Z79890 Hormone replacement therapy (postmenopausal): Secondary | ICD-10-CM | POA: Diagnosis not present

## 2018-02-02 DIAGNOSIS — J9601 Acute respiratory failure with hypoxia: Secondary | ICD-10-CM | POA: Diagnosis present

## 2018-02-02 DIAGNOSIS — E876 Hypokalemia: Secondary | ICD-10-CM | POA: Diagnosis not present

## 2018-02-02 DIAGNOSIS — Z79891 Long term (current) use of opiate analgesic: Secondary | ICD-10-CM | POA: Diagnosis not present

## 2018-02-02 DIAGNOSIS — B974 Respiratory syncytial virus as the cause of diseases classified elsewhere: Secondary | ICD-10-CM | POA: Diagnosis not present

## 2018-02-02 DIAGNOSIS — Z951 Presence of aortocoronary bypass graft: Secondary | ICD-10-CM | POA: Diagnosis not present

## 2018-02-02 DIAGNOSIS — I2510 Atherosclerotic heart disease of native coronary artery without angina pectoris: Secondary | ICD-10-CM | POA: Diagnosis present

## 2018-02-02 DIAGNOSIS — F329 Major depressive disorder, single episode, unspecified: Secondary | ICD-10-CM | POA: Diagnosis present

## 2018-02-02 DIAGNOSIS — Z79899 Other long term (current) drug therapy: Secondary | ICD-10-CM | POA: Diagnosis not present

## 2018-02-02 DIAGNOSIS — J210 Acute bronchiolitis due to respiratory syncytial virus: Secondary | ICD-10-CM

## 2018-02-02 DIAGNOSIS — Z885 Allergy status to narcotic agent status: Secondary | ICD-10-CM | POA: Diagnosis not present

## 2018-02-02 DIAGNOSIS — K219 Gastro-esophageal reflux disease without esophagitis: Secondary | ICD-10-CM | POA: Diagnosis present

## 2018-02-02 DIAGNOSIS — R0602 Shortness of breath: Secondary | ICD-10-CM | POA: Diagnosis present

## 2018-02-02 DIAGNOSIS — J205 Acute bronchitis due to respiratory syncytial virus: Secondary | ICD-10-CM | POA: Diagnosis present

## 2018-02-02 DIAGNOSIS — Z7982 Long term (current) use of aspirin: Secondary | ICD-10-CM | POA: Diagnosis not present

## 2018-02-02 DIAGNOSIS — I10 Essential (primary) hypertension: Secondary | ICD-10-CM | POA: Diagnosis not present

## 2018-02-02 LAB — CBC
HCT: 39.1 % (ref 36.0–46.0)
Hemoglobin: 12.3 g/dL (ref 12.0–15.0)
MCH: 27.6 pg (ref 26.0–34.0)
MCHC: 31.5 g/dL (ref 30.0–36.0)
MCV: 87.9 fL (ref 80.0–100.0)
Platelets: 190 10*3/uL (ref 150–400)
RBC: 4.45 MIL/uL (ref 3.87–5.11)
RDW: 15.7 % — ABNORMAL HIGH (ref 11.5–15.5)
WBC: 26 10*3/uL — ABNORMAL HIGH (ref 4.0–10.5)
nRBC: 0 % (ref 0.0–0.2)

## 2018-02-02 LAB — BASIC METABOLIC PANEL
Anion gap: 8 (ref 5–15)
BUN: 29 mg/dL — ABNORMAL HIGH (ref 8–23)
CALCIUM: 8.8 mg/dL — AB (ref 8.9–10.3)
CO2: 24 mmol/L (ref 22–32)
Chloride: 106 mmol/L (ref 98–111)
Creatinine, Ser: 1.06 mg/dL — ABNORMAL HIGH (ref 0.44–1.00)
GFR calc non Af Amer: 45 mL/min — ABNORMAL LOW (ref >60)
GFR, EST AFRICAN AMERICAN: 52 mL/min — AB (ref >60)
Glucose, Bld: 137 mg/dL — ABNORMAL HIGH (ref 70–99)
Potassium: 4.7 mmol/L (ref 3.5–5.1)
Sodium: 138 mmol/L (ref 135–145)

## 2018-02-02 MED ORDER — IPRATROPIUM BROMIDE 0.02 % IN SOLN
0.5000 mg | Freq: Three times a day (TID) | RESPIRATORY_TRACT | Status: DC
  Administered 2018-02-03 – 2018-02-04 (×4): 0.5 mg via RESPIRATORY_TRACT
  Filled 2018-02-02 (×4): qty 2.5

## 2018-02-02 MED ORDER — LEVALBUTEROL HCL 0.63 MG/3ML IN NEBU
0.6300 mg | INHALATION_SOLUTION | Freq: Three times a day (TID) | RESPIRATORY_TRACT | Status: DC
  Administered 2018-02-03 – 2018-02-04 (×4): 0.63 mg via RESPIRATORY_TRACT
  Filled 2018-02-02 (×4): qty 3

## 2018-02-02 MED ORDER — LEVALBUTEROL HCL 0.63 MG/3ML IN NEBU: 0.6300 mg | INHALATION_SOLUTION | Freq: Four times a day (QID) | RESPIRATORY_TRACT | Status: DC | PRN

## 2018-02-02 MED ORDER — METHYLPREDNISOLONE SODIUM SUCC 125 MG IJ SOLR
60.0000 mg | Freq: Two times a day (BID) | INTRAMUSCULAR | Status: DC
  Administered 2018-02-02 – 2018-02-04 (×4): 60 mg via INTRAVENOUS
  Filled 2018-02-02 (×4): qty 2

## 2018-02-02 NOTE — Progress Notes (Signed)
[facility] PROGRESS NOTE  Wendy Wiley ZOX:[id number] DOB: 06-05-1922 DOA: 01/31/2018 PCP: Rodrigo RanPerini, Mark, MD  Admitted with sob, cough, audible wheeze. resp panel + RSV. Improved from admission but still symptomatically SOB with even talking. Needs ambulation, IV steroids and pulm toilet   Assessment/Plan:  Acute respiratory distress related to RSV infection - Chest xray with Airway thickening is present, suggesting bronchitis or reactive airways disease. No airspace opacity identified. Upper thoracic compression fractures similar to those shown on MRI. - continues to remains raspy and tight sounding but no stridor today -continue scheduled neb -continue solumedrol- wean as able -mobilize -pulmonary toilet -continue doxy x 5 days  Sinus tachycardia resolved this am. Likely related to  prolonged albuterol treatment. ekg without acute changes. No events on tele.  - d/c tele today  Hypertension. Fair control. Home meds include lopressor and amlodipine -Continue with home medicine  CAD -History of CABG, continue with home meds including aspirin, statin, beta-blockers denies any chest pain. ekg without acute changes. Trop neg  Hypothyroidism -Continue Synthroid  Hyperlipidemia -Continue with statin  Leukocytosis -reactive from steroids-- no fevers  Code Status: dnr Family Communication: family at bedside Disposition Plan: for d/c needs improvement of symptoms, weaning off O2- due to age, high risk of decompensation   Antibiotics:  Doxycycline 01/31/18>>  HPI/Subjective:  Still short of breath- even with talking- does not feel like she is at her baseline "jitteriness" is better   Objective: Vitals:   02/02/18 0538 02/02/18 0838  BP: 118/70   Pulse: 71   Resp: 20   Temp: (!) 97.5 F (36.4 C)   SpO2: 100% 99%    Intake/Output Summary (Last 24 hours) at 02/02/2018 1054 Last data filed at 02/02/2018 0630 Gross per 24 hour  Intake 936.76 ml  Output 400 ml   Net 536.76 ml   Filed Weights   02/01/18 2013  Weight: 58.2 kg    Exam:   General:  In bed, appears to have increased work of breathing with talking, moving in bed  Cardiovascular: rrr  Respiratory: upper airway coarse breath sounds, decreased inspiratory wheezing, increase work of breathing  Abdomen: +BS, soft  Musculoskeletal: moves all 4 ext  Pleasant and cooperative  Data Reviewed: Basic Metabolic Panel: Recent Labs  Lab 01/31/18 1544 02/02/18 0435  NA 140 138  K 3.4* 4.7  CL 104 106  CO2 26 PENDING  GLUCOSE 123* 137*  BUN 16 29*  CREATININE 1.06* 1.06*  CALCIUM 9.4 8.8*  MG 2.1  --    Liver Function Tests: No results for input(s): AST, ALT, ALKPHOS, BILITOT, PROT, ALBUMIN in the last 168 hours. No results for input(s): LIPASE, AMYLASE in the last 168 hours. No results for input(s): AMMONIA in the last 168 hours. CBC: Recent Labs  Lab 01/31/18 1544 02/02/18 0435  WBC 10.6* 26.0*  NEUTROABS 7.4  --   HGB 14.9 12.3  HCT 48.9* 39.1  MCV 87.6 87.9  PLT 189 190   Cardiac Enzymes: No results for input(s): CKTOTAL, CKMB, CKMBINDEX, TROPONINI in the last 168 hours. BNP (last 3 results) Recent Labs    01/31/18 1544  BNP 122.3*    ProBNP (last 3 results) No results for input(s): PROBNP in the last 8760 hours.  CBG: No results for input(s): GLUCAP in the last 168 hours.  Recent Results (from the past 240 hour(s))  Respiratory Panel by PCR     Status: Abnormal   Collection Time: 01/31/18  5:17 PM  Result Value Ref Range Status  Adenovirus NOT DETECTED NOT DETECTED Final   Coronavirus 229E NOT DETECTED NOT DETECTED Final   Coronavirus HKU1 NOT DETECTED NOT DETECTED Final   Coronavirus NL63 NOT DETECTED NOT DETECTED Final   Coronavirus OC43 NOT DETECTED NOT DETECTED Final   Metapneumovirus NOT DETECTED NOT DETECTED Final   Rhinovirus / Enterovirus NOT DETECTED NOT DETECTED Final   Influenza A NOT DETECTED NOT DETECTED Final   Influenza B NOT  DETECTED NOT DETECTED Final   Parainfluenza Virus 1 NOT DETECTED NOT DETECTED Final   Parainfluenza Virus 2 NOT DETECTED NOT DETECTED Final   Parainfluenza Virus 3 NOT DETECTED NOT DETECTED Final   Parainfluenza Virus 4 NOT DETECTED NOT DETECTED Final   Respiratory Syncytial Virus DETECTED (A) NOT DETECTED Final    Comment: CRITICAL RESULT CALLED TO, READ BACK BY AND VERIFIED WITH: BClois Dupes. OLDLAND,CHARGE RN 2323 01/31/2018 T. TYSOR    Bordetella pertussis NOT DETECTED NOT DETECTED Final   Chlamydophila pneumoniae NOT DETECTED NOT DETECTED Final   Mycoplasma pneumoniae NOT DETECTED NOT DETECTED Final     Studies: Dg Chest 2 View  Result Date: 01/31/2018 CLINICAL DATA:  Short of breath, wheezing, and productive cough for 2 weeks. EXAM: CHEST - 2 VIEW COMPARISON:  10/03/2017 FINDINGS: Prior CABG. Atherosclerotic calcification of the aortic arch. Degenerative glenohumeral arthropathy, right greater than left. Old right anterior rib deformities. Heart size within normal limits. For thoracic compression fractures as shown on 10/10/2017. Airway thickening is present, suggesting bronchitis or reactive airways disease. Large lung volumes with some flattening of the diaphragms on the lateral projection, potentially from air trapping. IMPRESSION: 1. Airway thickening is present, suggesting bronchitis or reactive airways disease. No airspace opacity identified. 2. Upper thoracic compression fractures similar to those shown on MRI. 3.  Aortic Atherosclerosis (ICD10-I70.0).  Prior CABG. Electronically Signed   By: Gaylyn RongWalter  Liebkemann M.D.   On: 01/31/2018 15:03    Scheduled Meds: . amLODipine  5 mg Oral Daily  . aspirin EC  81 mg Oral QHS  . doxycycline  100 mg Oral Q12H  . enoxaparin (LOVENOX) injection  30 mg Subcutaneous Q24H  . escitalopram  5 mg Oral QHS  . ezetimibe  10 mg Oral QHS  . guaiFENesin  1,200 mg Oral BID  . ipratropium  0.5 mg Nebulization Q6H  . levalbuterol  0.63 mg Nebulization Q6H  .  levothyroxine  200 mcg Oral QAC breakfast  . methylPREDNISolone (SOLU-MEDROL) injection  60 mg Intravenous Q12H  . metoprolol tartrate  25 mg Oral BID  . mirabegron ER  25 mg Oral Daily  . mometasone-formoterol  2 puff Inhalation BID  . multivitamin  1 tablet Oral Daily  . potassium chloride  10 mEq Oral Daily   Continuous Infusions:  Principal Problem:   Respiratory distress Active Problems:   Coronary atherosclerosis   Essential hypertension   Hyperlipidemia   Reactive airway disease   RSV (respiratory syncytial virus infection)   Hypokalemia    Time spent: 25 minutes    Joseph ArtJessica U Sukhman Martine DO [facility]  If 7PM-7AM, please contact night-coverage at [email or web address], password [email or web address] 02/02/2018, 10:54 AM  LOS: 0 days

## 2018-02-03 DIAGNOSIS — J4540 Moderate persistent asthma, uncomplicated: Secondary | ICD-10-CM

## 2018-02-03 DIAGNOSIS — R0603 Acute respiratory distress: Secondary | ICD-10-CM

## 2018-02-03 MED ORDER — MENTHOL 3 MG MT LOZG
1.0000 | LOZENGE | OROMUCOSAL | Status: DC | PRN
  Administered 2018-02-03 – 2018-02-04 (×2): 3 mg via ORAL
  Filled 2018-02-03 (×3): qty 9

## 2018-02-03 MED ORDER — MAGIC MOUTHWASH W/LIDOCAINE
5.0000 mL | Freq: Four times a day (QID) | ORAL | Status: DC
  Administered 2018-02-03 – 2018-02-06 (×13): 5 mL via ORAL
  Filled 2018-02-03 (×13): qty 5

## 2018-02-03 NOTE — Progress Notes (Signed)
[facility] PROGRESS NOTE  RIYAN FADNESS WPY:[id number] DOB: 1922-08-18 DOA: 01/31/2018 PCP: Rodrigo Ran, MD  Brief narrative:  85 year old female with past medical history of hypertension, CAD, hypothyroidism, presents with shortness of breath, significant for RSV positive bronchitis, he continues to have diffuse wheezing, on IV steroids, and continued demand for oxygen   Subjective:  Denies any fever or chills, but still reports she is short of breath, , far from her baseline   Assessment/Plan:  Acute respiratory distress related to RSV infection -Chest x-ray showing airway thickening, suggestive of bronchitis, or reactive airway disease, no opacity identified.Upper thoracic compression fractures similar to those shown on MRI. -Continues today to have increased work of breathing, air entry, with diffuse wheezing, she will be continued on current dose IV steroids today, with no weaning. -Continue with scheduled nebulizer treatment, and PRN nebs - PT consulted, encouraged to ambulate -Continue with doxycycline for total of 5 days  Sinus tachycardia resolved this am. Likely related to  prolonged albuterol treatment. ekg without acute changes. No events on tele.  - d/c tele   Hypertension. Fair control. Home meds include lopressor and amlodipine -Continue with home medicine  CAD -History of CABG, continue with home meds including aspirin, statin, beta-blockers denies any chest pain. ekg without acute changes. Trop neg  Hypothyroidism -Continue Synthroid  Hyperlipidemia -Continue with statin  Leukocytosis -reactive from steroids-- no fevers  Code Status: dnr Family Communication: None at bedside Disposition Plan: for d/c needs improvement of symptoms, weaning off O2- due to age, high risk of decompensation   Antibiotics:  Doxycycline 01/31/18>>   Objective: Vitals:   02/03/18 1026 02/03/18 1035  BP: (!) 123/54 (!) 125/54  Pulse: 68 68  Resp:    Temp:     SpO2: 98%    No intake or output data in the 24 hours ending 02/03/18 1043 Filed Weights   02/01/18 2013  Weight: 58.2 kg    Exam:  Awake Alert, Oriented X 3, No new F.N deficits, frail, ill-appearing Symmetrical Chest wall movement, diminished air entry bilaterally, diffuse wheezing, some use of accessory muscles RRR,No Gallops,Rubs or new Murmurs, No Parasternal Heave +ve B.Sounds, Abd Soft, No tenderness, No rebound - guarding or rigidity. No Cyanosis, Clubbing or edema, No new Rash or bruise    Data Reviewed: Basic Metabolic Panel: Recent Labs  Lab 01/31/18 1544 02/02/18 0435  NA 140 138  K 3.4* 4.7  CL 104 106  CO2 26 24  GLUCOSE 123* 137*  BUN 16 29*  CREATININE 1.06* 1.06*  CALCIUM 9.4 8.8*  MG 2.1  --    Liver Function Tests: No results for input(s): AST, ALT, ALKPHOS, BILITOT, PROT, ALBUMIN in the last 168 hours. No results for input(s): LIPASE, AMYLASE in the last 168 hours. No results for input(s): AMMONIA in the last 168 hours. CBC: Recent Labs  Lab 01/31/18 1544 02/02/18 0435  WBC 10.6* 26.0*  NEUTROABS 7.4  --   HGB 14.9 12.3  HCT 48.9* 39.1  MCV 87.6 87.9  PLT 189 190   Cardiac Enzymes: No results for input(s): CKTOTAL, CKMB, CKMBINDEX, TROPONINI in the last 168 hours. BNP (last 3 results) Recent Labs    01/31/18 1544  BNP 122.3*    ProBNP (last 3 results) No results for input(s): PROBNP in the last 8760 hours.  CBG: No results for input(s): GLUCAP in the last 168 hours.  Recent Results (from the past 240 hour(s))  Respiratory Panel by PCR     Status: Abnormal  Collection Time: 01/31/18  5:17 PM  Result Value Ref Range Status   Adenovirus NOT DETECTED NOT DETECTED Final   Coronavirus 229E NOT DETECTED NOT DETECTED Final   Coronavirus HKU1 NOT DETECTED NOT DETECTED Final   Coronavirus NL63 NOT DETECTED NOT DETECTED Final   Coronavirus OC43 NOT DETECTED NOT DETECTED Final   Metapneumovirus NOT DETECTED NOT DETECTED Final    Rhinovirus / Enterovirus NOT DETECTED NOT DETECTED Final   Influenza A NOT DETECTED NOT DETECTED Final   Influenza B NOT DETECTED NOT DETECTED Final   Parainfluenza Virus 1 NOT DETECTED NOT DETECTED Final   Parainfluenza Virus 2 NOT DETECTED NOT DETECTED Final   Parainfluenza Virus 3 NOT DETECTED NOT DETECTED Final   Parainfluenza Virus 4 NOT DETECTED NOT DETECTED Final   Respiratory Syncytial Virus DETECTED (A) NOT DETECTED Final    Comment: CRITICAL RESULT CALLED TO, READ BACK BY AND VERIFIED WITH: BClois Dupes RN 2323 01/31/2018 T. TYSOR    Bordetella pertussis NOT DETECTED NOT DETECTED Final   Chlamydophila pneumoniae NOT DETECTED NOT DETECTED Final   Mycoplasma pneumoniae NOT DETECTED NOT DETECTED Final     Studies: No results found.  Scheduled Meds: . amLODipine  5 mg Oral Daily  . aspirin EC  81 mg Oral QHS  . doxycycline  100 mg Oral Q12H  . enoxaparin (LOVENOX) injection  30 mg Subcutaneous Q24H  . escitalopram  5 mg Oral QHS  . ezetimibe  10 mg Oral QHS  . guaiFENesin  1,200 mg Oral BID  . ipratropium  0.5 mg Nebulization TID  . levalbuterol  0.63 mg Nebulization TID  . levothyroxine  200 mcg Oral QAC breakfast  . magic mouthwash w/lidocaine  5 mL Oral QID  . methylPREDNISolone (SOLU-MEDROL) injection  60 mg Intravenous Q12H  . metoprolol tartrate  25 mg Oral BID  . mirabegron ER  25 mg Oral Daily  . mometasone-formoterol  2 puff Inhalation BID  . multivitamin  1 tablet Oral Daily  . potassium chloride  10 mEq Oral Daily   Continuous Infusions:  Principal Problem:   Respiratory distress Active Problems:   Coronary atherosclerosis   Essential hypertension   Hyperlipidemia   URI (upper respiratory infection)   Reactive airway disease   RSV (respiratory syncytial virus infection)   Hypokalemia    Huey Bienenstock  MD [facility]  If 7PM-7AM, please contact night-coverage at [email or web address], password [facility] 02/03/2018, 10:43 AM  LOS: 1 day

## 2018-02-03 NOTE — Evaluation (Signed)
Physical Therapy Evaluation Patient Details Name: Wendy Wiley MRN: [id number] DOB: 1922/08/26 Today's Date: 02/03/2018   History of Present Illness  85 year old female with past medical history of hypertension, CAD, hypothyroidism, presents with shortness of breath, significant for RSV positive bronchitis, he continues to have diffuse wheezing, on IV steroids, and continued demand for oxygen  Clinical Impression  Patient presents with decreased independence with mobility due to weakness, decreased balance, increased oxygen requirement and decreased activity tolerance.  She will benefit from skilled PT in the acute setting to allow return home with family support and follow up HHPT.     Follow Up Recommendations Home health PT;Supervision/Assistance - 24 hour    Equipment Recommendations  None recommended by PT    Recommendations for Other Services       Precautions / Restrictions Precautions Precautions: Fall Precaution Comments: watch O2      Mobility  Bed Mobility               General bed mobility comments: up in chair  Transfers Overall transfer level: Needs assistance Equipment used: Rolling walker (2 wheeled) Transfers: Sit to/from Stand Sit to Stand: Min assist         General transfer comment: increased time to rise  Ambulation/Gait Ambulation/Gait assistance: Min assist;Min guard Gait Distance (Feet): 150 Feet Assistive device: Rolling walker (2 wheeled)       General Gait Details: on RA part of walk, SpO2 dropped to 84%, on 2L O2 [place] SpO2 91%, RN aware, assist for balance/safety  Stairs            Wheelchair Mobility    Modified Rankin (Stroke Patients Only)       Balance Overall balance assessment: Needs assistance   Sitting balance-Leahy Scale: Good     Standing balance support: Bilateral upper extremity supported Standing balance-Leahy Scale: Poor Standing balance comment: UE support for balance                              Pertinent Vitals/Pain Pain Assessment: No/denies pain    Home Living Family/patient expects to be discharged to:: Private residence Living Arrangements: Children(son and daugther in law) Available Help at Discharge: Family;Available 24 hours/day Type of Home: House Home Access: Ramped entrance     Home Layout: One level Home Equipment: Walker - 2 wheels;Shower seat      Prior Function Level of Independence: Independent with assistive device(s);Needs assistance   Gait / Transfers Assistance Needed: gait independent with device  ADL's / Homemaking Assistance Needed: supervision for shower        Hand Dominance        Extremity/Trunk Assessment   Upper Extremity Assessment Upper Extremity Assessment: Generalized weakness    Lower Extremity Assessment Lower Extremity Assessment: Generalized weakness    Cervical / Trunk Assessment Cervical / Trunk Assessment: Kyphotic  Communication   Communication: No difficulties  Cognition Arousal/Alertness: Awake/alert Behavior During Therapy: WFL for tasks assessed/performed Overall Cognitive Status: History of cognitive impairments - at baseline                                 General Comments: daughter in law answering for her at times about length of time she has been up in chair and house set up      General Comments      Exercises     Assessment/Plan  PT Assessment Patient needs continued PT services  PT Problem List Decreased mobility;Decreased activity tolerance;Decreased knowledge of use of DME;Decreased strength;Cardiopulmonary status limiting activity;Decreased balance       PT Treatment Interventions DME instruction;Therapeutic exercise;Gait training;Balance training;Functional mobility training;Therapeutic activities;Patient/family education    PT Goals (Current goals can be found in the Care Plan section)  Acute Rehab PT Goals Patient Stated Goal: to get stronger, go home  asap PT Goal Formulation: With patient/family Time For Goal Achievement: 02/10/18 Potential to Achieve Goals: Good    Frequency Min 3X/week   Barriers to discharge        Co-evaluation               AM-PAC PT "6 Clicks" Mobility  Outcome Measure Help needed turning from your back to your side while in a flat bed without using bedrails?: A Little Help needed moving from lying on your back to sitting on the side of a flat bed without using bedrails?: A Little Help needed moving to and from a bed to a chair (including a wheelchair)?: A Little Help needed standing up from a chair using your arms (e.g., wheelchair or bedside chair)?: A Little Help needed to walk in hospital room?: A Little Help needed climbing 3-5 steps with a railing? : A Little 6 Click Score: 18    End of Session Equipment Utilized During Treatment: Gait belt;Oxygen Activity Tolerance: Patient limited by fatigue Patient left: in chair;with call bell/phone within reach;with family/visitor present   PT Visit Diagnosis: Other abnormalities of gait and mobility (R26.89);Muscle weakness (generalized) (M62.81)    Time: [id number] PT Time Calculation (min) (ACUTE ONLY): 24 min   Charges:   PT Evaluation $PT Eval Moderate Complexity: 1 Mod PT Treatments $Gait Training: 8-22 mins        Sheran LawlessCyndi Delwyn [place], [place] CarolinaPT Acute Rehabilitation Services [phone] 02/03/2018   Wendy Wiley 02/03/2018, 5:14 PM

## 2018-02-03 NOTE — Progress Notes (Signed)
Weaned patient to 1L nasal cannula at 1330; SpO2 97-98%.

## 2018-02-04 ENCOUNTER — Inpatient Hospital Stay (HOSPITAL_COMMUNITY): Payer: Medicare Other

## 2018-02-04 DIAGNOSIS — J9621 Acute and chronic respiratory failure with hypoxia: Secondary | ICD-10-CM

## 2018-02-04 DIAGNOSIS — J440 Chronic obstructive pulmonary disease with acute lower respiratory infection: Secondary | ICD-10-CM

## 2018-02-04 DIAGNOSIS — J209 Acute bronchitis, unspecified: Secondary | ICD-10-CM

## 2018-02-04 LAB — CBC
HCT: 39 % (ref 36.0–46.0)
Hemoglobin: 12.5 g/dL (ref 12.0–15.0)
MCH: 28 pg (ref 26.0–34.0)
MCHC: 32.1 g/dL (ref 30.0–36.0)
MCV: 87.4 fL (ref 80.0–100.0)
Platelets: 187 10*3/uL (ref 150–400)
RBC: 4.46 MIL/uL (ref 3.87–5.11)
RDW: 15.7 % — AB (ref 11.5–15.5)
WBC: 17.6 10*3/uL — ABNORMAL HIGH (ref 4.0–10.5)
nRBC: 0 % (ref 0.0–0.2)

## 2018-02-04 LAB — BASIC METABOLIC PANEL
Anion gap: 6 (ref 5–15)
BUN: 26 mg/dL — ABNORMAL HIGH (ref 8–23)
CO2: 25 mmol/L (ref 22–32)
Calcium: 8.9 mg/dL (ref 8.9–10.3)
Chloride: 108 mmol/L (ref 98–111)
Creatinine, Ser: 0.92 mg/dL (ref 0.44–1.00)
GFR calc Af Amer: >60 mL/min (ref >60)
GFR calc non Af Amer: 54 mL/min — ABNORMAL LOW (ref >60)
GLUCOSE: 147 mg/dL — AB (ref 70–99)
Potassium: 4.8 mmol/L (ref 3.5–5.1)
Sodium: 139 mmol/L (ref 135–145)

## 2018-02-04 MED ORDER — IPRATROPIUM BROMIDE 0.02 % IN SOLN
0.5000 mg | Freq: Four times a day (QID) | RESPIRATORY_TRACT | Status: DC
  Administered 2018-02-04 – 2018-02-06 (×8): 0.5 mg via RESPIRATORY_TRACT
  Filled 2018-02-04 (×9): qty 2.5

## 2018-02-04 MED ORDER — OXYMETAZOLINE HCL 0.05 % NA SOLN
1.0000 | Freq: Two times a day (BID) | NASAL | Status: DC
  Administered 2018-02-04 – 2018-02-06 (×5): 1 via NASAL
  Filled 2018-02-04: qty 15

## 2018-02-04 MED ORDER — FLUTICASONE PROPIONATE 50 MCG/ACT NA SUSP
2.0000 | Freq: Every day | NASAL | Status: DC
  Administered 2018-02-04 – 2018-02-06 (×3): 2 via NASAL
  Filled 2018-02-04: qty 16

## 2018-02-04 MED ORDER — LORATADINE 10 MG PO TABS
10.0000 mg | ORAL_TABLET | Freq: Every day | ORAL | Status: DC
  Administered 2018-02-04 – 2018-02-06 (×3): 10 mg via ORAL
  Filled 2018-02-04 (×3): qty 1

## 2018-02-04 MED ORDER — METHYLPREDNISOLONE SODIUM SUCC 40 MG IJ SOLR
40.0000 mg | Freq: Three times a day (TID) | INTRAMUSCULAR | Status: DC
  Administered 2018-02-04 – 2018-02-06 (×6): 40 mg via INTRAVENOUS
  Filled 2018-02-04 (×6): qty 1

## 2018-02-04 MED ORDER — LEVALBUTEROL HCL 0.63 MG/3ML IN NEBU
0.6300 mg | INHALATION_SOLUTION | Freq: Four times a day (QID) | RESPIRATORY_TRACT | Status: DC
  Administered 2018-02-04 – 2018-02-06 (×8): 0.63 mg via RESPIRATORY_TRACT
  Filled 2018-02-04 (×9): qty 3

## 2018-02-04 MED ORDER — FUROSEMIDE 10 MG/ML IJ SOLN
40.0000 mg | Freq: Once | INTRAMUSCULAR | Status: AC
  Administered 2018-02-04: 40 mg via INTRAVENOUS
  Filled 2018-02-04: qty 4

## 2018-02-04 NOTE — Progress Notes (Addendum)
PROGRESS NOTE        PATIENT DETAILS Name: Wendy Wiley Age: 85 y.o. Sex: female Date of Birth: 10/30/1922 Admit Date: 01/31/2018 Admitting Physician Starleen Armsawood S Elgergawy, MD NWG:[id number]:Perini, Loraine LericheMark, MD  Brief Narrative: Patient is a 85 y.o. female, CAD, hypothyroidism-presented with shortness of breath/wheezing-found to have acute hypoxic respiratory failure in the setting of acute bronchitis related to RSV infection.  Subjective: Continues to have significant wheezing and coughing-not yet back to baseline.  Still gets short of breath when she ambulates a few steps.  She appears comfortable at rest.  Assessment/Plan: Acute hypoxic respiratory failure secondary to acute bronchitis due to RSV: Although improved-continues to have significant wheezing-and shortness of breath with minimal ambulation.  Does acknowledge postnasal drip-like symptoms-hence we will add Flonase/Afrin/Claritin.  Chest x-ray on 1/8 did not show any major infiltrates-do not think she requires antimicrobial therapy-hence we will stop doxycycline.  Continue supportive care with steroids, bronchodilators-although no obvious signs of volume overload-BNP minimally elevated-we will give 1 dose of Lasix today.  Follow clinical course-we will reassess tomorrow.  Leukocytosis: Suspect reactive-and likely from steroids.  Hypertension: Controlled-continue Lopressor and amlodipine  CAD: No anginal symptoms-continue aspirin, statin and beta-blocker.  Hypothyroidism: Continue Synthroid  Dyslipidemia: Continue statin  DVT Prophylaxis: Prophylactic Lovenox   Code Status: DNR  Family Communication: None at bedside  Disposition Plan: Remain inpatient  Antimicrobial agents: Anti-infectives (From admission, onward)   Start     Dose/Rate Route Frequency Ordered Stop   02/01/18 1100  doxycycline (VIBRA-TABS) tablet 100 mg     100 mg Oral Every 12 hours 02/01/18 1059     01/31/18 1730  azithromycin  (ZITHROMAX) 500 mg in sodium chloride 0.9 % 250 mL IVPB  Status:  Discontinued     500 mg 250 mL/hr over 60 Minutes Intravenous Every 24 hours 01/31/18 1701 01/31/18 1743      Procedures: None  CONSULTS:  None  Time spent: 25- minutes-Greater than 50% of this time was spent in counseling, explanation of diagnosis, planning of further management, and coordination of care.  MEDICATIONS: Scheduled Meds: . amLODipine  5 mg Oral Daily  . aspirin EC  81 mg Oral QHS  . doxycycline  100 mg Oral Q12H  . enoxaparin (LOVENOX) injection  30 mg Subcutaneous Q24H  . escitalopram  5 mg Oral QHS  . ezetimibe  10 mg Oral QHS  . fluticasone  2 spray Each Nare Daily  . guaiFENesin  1,200 mg Oral BID  . ipratropium  0.5 mg Nebulization Q6H  . levalbuterol  0.63 mg Nebulization Q6H  . levothyroxine  200 mcg Oral QAC breakfast  . loratadine  10 mg Oral Daily  . magic mouthwash w/lidocaine  5 mL Oral QID  . methylPREDNISolone (SOLU-MEDROL) injection  60 mg Intravenous Q12H  . metoprolol tartrate  25 mg Oral BID  . mirabegron ER  25 mg Oral Daily  . multivitamin  1 tablet Oral Daily  . oxymetazoline  1 spray Each Nare BID  . potassium chloride  10 mEq Oral Daily   Continuous Infusions: PRN Meds:.acetaminophen **OR** acetaminophen, albuterol, levalbuterol, menthol-cetylpyridinium   PHYSICAL EXAM: Vital signs: Vitals:   02/04/18 0623 02/04/18 0728 02/04/18 0831 02/04/18 1350  BP: 127/79  127/65   Pulse: 67  66   Resp:      Temp: (!) 97.5 F (36.4 C)  TempSrc: Oral     SpO2: 100% 96% 97% 98%  Weight:      Height:       Filed Weights   02/01/18 2013  Weight: 58.2 kg   Body mass index is 25.91 kg/m.   General appearance :Awake, alert, not in any distress.  Eyes:Pink conjunctiva HEENT: Atraumatic and Normocephalic Neck: supple Resp:Good air entry bilaterally, coarse wheezing all over CVS: S1 S2 regular GI: Bowel sounds present, Non tender and not distended with no gaurding,  rigidity or rebound.No organomegaly Extremities: B/L Lower Ext shows trace edema, both legs are warm to touch Neurology:  speech clear,Non focal, sensation is grossly intact. Psychiatric: Normal judgment and insight. Alert and oriented x 3. Normal mood. Musculoskeletal:No digital cyanosis Skin:No Rash, warm and dry Wounds:N/A  I have personally reviewed following labs and imaging studies  LABORATORY DATA: CBC: Recent Labs  Lab 01/31/18 1544 02/02/18 0435 02/04/18 0409  WBC 10.6* 26.0* 17.6*  NEUTROABS 7.4  --   --   HGB 14.9 12.3 12.5  HCT 48.9* 39.1 39.0  MCV 87.6 87.9 87.4  PLT 189 190 187    Basic Metabolic Panel: Recent Labs  Lab 01/31/18 1544 02/02/18 0435 02/04/18 0409  NA 140 138 139  K 3.4* 4.7 4.8  CL 104 106 108  CO2 26 24 25   GLUCOSE 123* 137* 147*  BUN 16 29* 26*  CREATININE 1.06* 1.06* 0.92  CALCIUM 9.4 8.8* 8.9  MG 2.1  --   --     GFR: Estimated Creatinine Clearance: 29.7 mL/min (by C-G formula based on SCr of 0.92 mg/dL).  Liver Function Tests: No results for input(s): AST, ALT, ALKPHOS, BILITOT, PROT, ALBUMIN in the last 168 hours. No results for input(s): LIPASE, AMYLASE in the last 168 hours. No results for input(s): AMMONIA in the last 168 hours.  Coagulation Profile: No results for input(s): INR, PROTIME in the last 168 hours.  Cardiac Enzymes: No results for input(s): CKTOTAL, CKMB, CKMBINDEX, TROPONINI in the last 168 hours.  BNP (last 3 results) No results for input(s): PROBNP in the last 8760 hours.  HbA1C: No results for input(s): HGBA1C in the last 72 hours.  CBG: No results for input(s): GLUCAP in the last 168 hours.  Lipid Profile: No results for input(s): CHOL, HDL, LDLCALC, TRIG, CHOLHDL, LDLDIRECT in the last 72 hours.  Thyroid Function Tests: No results for input(s): TSH, T4TOTAL, FREET4, T3FREE, THYROIDAB in the last 72 hours.  Anemia Panel: No results for input(s): VITAMINB12, FOLATE, FERRITIN, TIBC, IRON,  RETICCTPCT in the last 72 hours.  Urine analysis: No results found for: COLORURINE, APPEARANCEUR, LABSPEC, PHURINE, GLUCOSEU, HGBUR, BILIRUBINUR, KETONESUR, PROTEINUR, UROBILINOGEN, NITRITE, LEUKOCYTESUR  Sepsis Labs: Lactic Acid, Venous No results found for: LATICACIDVEN  MICROBIOLOGY: Recent Results (from the past 240 hour(s))  Respiratory Panel by PCR     Status: Abnormal   Collection Time: 01/31/18  5:17 PM  Result Value Ref Range Status   Adenovirus NOT DETECTED NOT DETECTED Final   Coronavirus 229E NOT DETECTED NOT DETECTED Final   Coronavirus HKU1 NOT DETECTED NOT DETECTED Final   Coronavirus NL63 NOT DETECTED NOT DETECTED Final   Coronavirus OC43 NOT DETECTED NOT DETECTED Final   Metapneumovirus NOT DETECTED NOT DETECTED Final   Rhinovirus / Enterovirus NOT DETECTED NOT DETECTED Final   Influenza A NOT DETECTED NOT DETECTED Final   Influenza B NOT DETECTED NOT DETECTED Final   Parainfluenza Virus 1 NOT DETECTED NOT DETECTED Final   Parainfluenza Virus 2 NOT DETECTED  NOT DETECTED Final   Parainfluenza Virus 3 NOT DETECTED NOT DETECTED Final   Parainfluenza Virus 4 NOT DETECTED NOT DETECTED Final   Respiratory Syncytial Virus DETECTED (A) NOT DETECTED Final    Comment: CRITICAL RESULT CALLED TO, READ BACK BY AND VERIFIED WITH: BClois Dupes. OLDLAND,CHARGE RN [phone] 01/31/2018 T. TYSOR    Bordetella pertussis NOT DETECTED NOT DETECTED Final   Chlamydophila pneumoniae NOT DETECTED NOT DETECTED Final   Mycoplasma pneumoniae NOT DETECTED NOT DETECTED Final    RADIOLOGY STUDIES/RESULTS: Dg Chest 2 View  Result Date: 01/31/2018 CLINICAL DATA:  Short of breath, wheezing, and productive cough for 2 weeks. EXAM: CHEST - 2 VIEW COMPARISON:  10/03/2017 FINDINGS: Prior CABG. Atherosclerotic calcification of the aortic arch. Degenerative glenohumeral arthropathy, right greater than left. Old right anterior rib deformities. Heart size within normal limits. For thoracic compression fractures as shown  on 10/10/2017. Airway thickening is present, suggesting bronchitis or reactive airways disease. Large lung volumes with some flattening of the diaphragms on the lateral projection, potentially from air trapping. IMPRESSION: 1. Airway thickening is present, suggesting bronchitis or reactive airways disease. No airspace opacity identified. 2. Upper thoracic compression fractures similar to those shown on MRI. 3.  Aortic Atherosclerosis (ICD10-I70.0).  Prior CABG. Electronically Signed   By: Gaylyn RongWalter  Liebkemann M.D.   On: 01/31/2018 15:03   Dg Chest Port 1 View  Result Date: 02/04/2018 CLINICAL DATA:  Shortness of Breath EXAM: PORTABLE CHEST 1 VIEW COMPARISON:  01/31/2018 FINDINGS: Cardiac shadow is stable. Postsurgical changes are again seen. The lungs are clear bilaterally. No focal infiltrate or sizable effusion is seen. No acute bony abnormality is noted. IMPRESSION: No active disease. Electronically Signed   By: Alcide CleverMark  Lukens M.D.   On: 02/04/2018 09:03     LOS: 2 days   Jeoffrey MassedShanker Ghimire, MD  [facility]  If 7PM-7AM, please contact night-coverage  Please page via [email or web address]-Password [email or web address]-click on MD name and type text message  02/04/2018, 1:54 PM

## 2018-02-04 NOTE — Progress Notes (Signed)
Weaned patient down to room air without any complications. Patient's saturations have maintained above 95%. Will continue to monitor.

## 2018-02-04 NOTE — Progress Notes (Signed)
Physical Therapy Treatment Patient Details Name: Wendy Wiley MRN: [id number] DOB: [birth year] Today's Date: 02/04/2018    History of Present Illness Pt is a 85 year old female with past medical history of hypertension, CAD, hypothyroidism, presents with shortness of breath, significant for RSV positive bronchitis, he continues to have diffuse wheezing, on IV steroids, and continued demand for oxygen    PT Comments    Pt making steady progress with functional mobility and required less physical assistance since previous session. Pt would continue to benefit from skilled physical therapy services at this time while admitted and after d/c to address the below listed limitations in order to improve overall safety and independence with functional mobility.    Follow Up Recommendations  Home health PT;Supervision/Assistance - 24 hour     Equipment Recommendations  None recommended by PT    Recommendations for Other Services       Precautions / Restrictions Precautions Precautions: Fall Precaution Comments: watch SpO2 Restrictions Weight Bearing Restrictions: No    Mobility  Bed Mobility               General bed mobility comments: up in chair  Transfers Overall transfer level: Needs assistance Equipment used: Rolling walker (2 wheeled) Transfers: Sit to/from Stand Sit to Stand: Min guard         General transfer comment: min guard for safety  Ambulation/Gait Ambulation/Gait assistance: Min guard Gait Distance (Feet): 100 Feet Assistive device: Rolling walker (2 wheeled) Gait Pattern/deviations: Step-through pattern Gait velocity: decreased   General Gait Details: pt steady with RW; one standing rest break to perform pursed lip breathing. Pt on RA throughout with one episode of desat to 80% which quickly recovered to >90% with standing rest and pursed lip breathing   Stairs             Wheelchair Mobility    Modified Rankin (Stroke Patients Only)        Balance Overall balance assessment: Needs assistance Sitting-balance support: Feet supported Sitting balance-Leahy Scale: Good     Standing balance support: Bilateral upper extremity supported Standing balance-Leahy Scale: Poor Standing balance comment: UE support for balance                            Cognition Arousal/Alertness: Awake/alert Behavior During Therapy: WFL for tasks assessed/performed Overall Cognitive Status: Within Functional Limits for tasks assessed                                        Exercises      General Comments        Pertinent Vitals/Pain Pain Assessment: No/denies pain    Home Living                      Prior Function            PT Goals (current goals can now be found in the care plan section) Acute Rehab PT Goals PT Goal Formulation: With patient/family Time For Goal Achievement: 02/10/18 Potential to Achieve Goals: Good Progress towards PT goals: Progressing toward goals    Frequency    Min 3X/week      PT Plan Current plan remains appropriate    Co-evaluation              AM-PAC PT "6 Clicks" Mobility   Outcome Measure  Help  needed turning from your back to your side while in a flat bed without using bedrails?: None Help needed moving from lying on your back to sitting on the side of a flat bed without using bedrails?: None Help needed moving to and from a bed to a chair (including a wheelchair)?: None Help needed standing up from a chair using your arms (e.g., wheelchair or bedside chair)?: None Help needed to walk in hospital room?: A Little Help needed climbing 3-5 steps with a railing? : A Little 6 Click Score: 22    End of Session Equipment Utilized During Treatment: Gait belt Activity Tolerance: Patient tolerated treatment well Patient left: in chair;with call bell/phone within reach;with family/visitor present Nurse Communication: Mobility status PT Visit  Diagnosis: Other abnormalities of gait and mobility (R26.89);Muscle weakness (generalized) (M62.81)     Time: 5789-7847 PT Time Calculation (min) (ACUTE ONLY): 16 min  Charges:  $Gait Training: 8-22 mins                     Deborah Chalk, [place], DPT  Acute Rehabilitation Services Pager [phone] Office [phone]     Wendy Wiley 02/04/2018, 4:37 PM

## 2018-02-05 LAB — CBC
HCT: 43.6 % (ref 36.0–46.0)
Hemoglobin: 13.6 g/dL (ref 12.0–15.0)
MCH: 26.7 pg (ref 26.0–34.0)
MCHC: 31.2 g/dL (ref 30.0–36.0)
MCV: 85.7 fL (ref 80.0–100.0)
Platelets: 197 10*3/uL (ref 150–400)
RBC: 5.09 MIL/uL (ref 3.87–5.11)
RDW: 15.5 % (ref 11.5–15.5)
WBC: 15.8 10*3/uL — ABNORMAL HIGH (ref 4.0–10.5)
nRBC: 0 % (ref 0.0–0.2)

## 2018-02-05 NOTE — Progress Notes (Signed)
PROGRESS NOTE        PATIENT DETAILS Name: CATHYANN WALZER Age: 85 y.o. Sex: female Date of Birth: 1922/11/19 Admit Date: 01/31/2018 Admitting Physician Starleen Arms, MD FAO:[id number], Loraine Leriche, MD  Brief Narrative: Patient is a 85 y.o. female, CAD, hypothyroidism-presented with shortness of breath/wheezing-found to have acute hypoxic respiratory failure in the setting of acute bronchitis related to RSV infection.  Subjective: Feels better-continues to wheeze-less short of breath when ambulating to the room this morning compared to yesterday.  She is still not back to her baseline.  Assessment/Plan: Acute hypoxic respiratory failure secondary to acute bronchitis due to RSV: Improved-compared to yesterday-but continues to have significant wheezing-although moving air well.  Less exertional dyspnea today compared to yesterday.  Chest x-ray on 1/8 did not show any infiltrates-hence doxycycline was discontinued.  Continue steroids-but will taper slightly, continue bronchodilators.  She appears to be slowly improving-but not yet back to baseline-suspect requires another day or two of hospitalization before consideration of discharge  Leukocytosis: Suspect reactive-likely from steroids.    Hypertension: Trolled-continue Lopressor and amlodipine  CAD: No anginal symptoms-continue aspirin, statin and beta-blocker.   Hypothyroidism: Continue Synthroid  Dyslipidemia: Continue statin  DVT Prophylaxis: Prophylactic Lovenox   Code Status: DNR  Family Communication: None at bedside  Disposition Plan: Remain inpatient  Antimicrobial agents: Anti-infectives (From admission, onward)   Start     Dose/Rate Route Frequency Ordered Stop   02/01/18 1100  doxycycline (VIBRA-TABS) tablet 100 mg     100 mg Oral Every 12 hours 02/01/18 1059     01/31/18 1730  azithromycin (ZITHROMAX) 500 mg in sodium chloride 0.9 % 250 mL IVPB  Status:  Discontinued     500 mg 250 mL/hr  over 60 Minutes Intravenous Every 24 hours 01/31/18 1701 01/31/18 1743      Procedures: None  CONSULTS:  None  Time spent: 25- minutes-Greater than 50% of this time was spent in counseling, explanation of diagnosis, planning of further management, and coordination of care.  MEDICATIONS: Scheduled Meds: . amLODipine  5 mg Oral Daily  . aspirin EC  81 mg Oral QHS  . doxycycline  100 mg Oral Q12H  . enoxaparin (LOVENOX) injection  30 mg Subcutaneous Q24H  . escitalopram  5 mg Oral QHS  . ezetimibe  10 mg Oral QHS  . fluticasone  2 spray Each Nare Daily  . guaiFENesin  1,200 mg Oral BID  . ipratropium  0.5 mg Nebulization Q6H  . levalbuterol  0.63 mg Nebulization Q6H  . levothyroxine  200 mcg Oral QAC breakfast  . loratadine  10 mg Oral Daily  . magic mouthwash w/lidocaine  5 mL Oral QID  . methylPREDNISolone (SOLU-MEDROL) injection  40 mg Intravenous Q8H  . metoprolol tartrate  25 mg Oral BID  . mirabegron ER  25 mg Oral Daily  . multivitamin  1 tablet Oral Daily  . oxymetazoline  1 spray Each Nare BID  . potassium chloride  10 mEq Oral Daily   Continuous Infusions: PRN Meds:.acetaminophen **OR** acetaminophen, albuterol, levalbuterol, menthol-cetylpyridinium   PHYSICAL EXAM: Vital signs: Vitals:   02/04/18 2117 02/05/18 0254 02/05/18 0508 02/05/18 0801  BP: (!) 129/53  (!) 131/53   Pulse: 72  69   Resp: 20  16   Temp: 97.7 F (36.5 C)  98.4 F (36.9 C)   TempSrc: Oral  Oral  SpO2: 96% 95% 96% 95%  Weight:      Height:       Filed Weights   02/01/18 2013  Weight: 58.2 kg   Body mass index is 25.91 kg/m.   General appearance:Awake, alert, not in any distress.  Eyes:no scleral icterus. HEENT: Atraumatic and Normocephalic Neck: supple, no JVD. Resp:Good air entry bilaterally, coarse rhonchi all over CVS: S1 S2 regular GI: Bowel sounds present, Non tender and not distended with no gaurding, rigidity or rebound. Extremities: B/L Lower Ext shows no edema,  both legs are warm to touch Neurology:  Non focal Psychiatric: Normal judgment and insight. Normal mood. Musculoskeletal:No digital cyanosis Skin:No Rash, warm and dry Wounds:N/A  I have personally reviewed following labs and imaging studies  LABORATORY DATA: CBC: Recent Labs  Lab 01/31/18 1544 02/02/18 0435 02/04/18 0409 02/05/18 0434  WBC 10.6* 26.0* 17.6* 15.8*  NEUTROABS 7.4  --   --   --   HGB 14.9 12.3 12.5 13.6  HCT 48.9* 39.1 39.0 43.6  MCV 87.6 87.9 87.4 85.7  PLT 189 190 187 197    Basic Metabolic Panel: Recent Labs  Lab 01/31/18 1544 02/02/18 0435 02/04/18 0409  NA 140 138 139  K 3.4* 4.7 4.8  CL 104 106 108  CO2 26 24 25   GLUCOSE 123* 137* 147*  BUN 16 29* 26*  CREATININE 1.06* 1.06* 0.92  CALCIUM 9.4 8.8* 8.9  MG 2.1  --   --     GFR: Estimated Creatinine Clearance: 29.7 mL/min (by C-G formula based on SCr of 0.92 mg/dL).  Liver Function Tests: No results for input(s): AST, ALT, ALKPHOS, BILITOT, PROT, ALBUMIN in the last 168 hours. No results for input(s): LIPASE, AMYLASE in the last 168 hours. No results for input(s): AMMONIA in the last 168 hours.  Coagulation Profile: No results for input(s): INR, PROTIME in the last 168 hours.  Cardiac Enzymes: No results for input(s): CKTOTAL, CKMB, CKMBINDEX, TROPONINI in the last 168 hours.  BNP (last 3 results) No results for input(s): PROBNP in the last 8760 hours.  HbA1C: No results for input(s): HGBA1C in the last 72 hours.  CBG: No results for input(s): GLUCAP in the last 168 hours.  Lipid Profile: No results for input(s): CHOL, HDL, LDLCALC, TRIG, CHOLHDL, LDLDIRECT in the last 72 hours.  Thyroid Function Tests: No results for input(s): TSH, T4TOTAL, FREET4, T3FREE, THYROIDAB in the last 72 hours.  Anemia Panel: No results for input(s): VITAMINB12, FOLATE, FERRITIN, TIBC, IRON, RETICCTPCT in the last 72 hours.  Urine analysis: No results found for: COLORURINE, APPEARANCEUR, LABSPEC,  PHURINE, GLUCOSEU, HGBUR, BILIRUBINUR, KETONESUR, PROTEINUR, UROBILINOGEN, NITRITE, LEUKOCYTESUR  Sepsis Labs: Lactic Acid, Venous No results found for: LATICACIDVEN  MICROBIOLOGY: Recent Results (from the past 240 hour(s))  Respiratory Panel by PCR     Status: Abnormal   Collection Time: 01/31/18  5:17 PM  Result Value Ref Range Status   Adenovirus NOT DETECTED NOT DETECTED Final   Coronavirus 229E NOT DETECTED NOT DETECTED Final   Coronavirus HKU1 NOT DETECTED NOT DETECTED Final   Coronavirus NL63 NOT DETECTED NOT DETECTED Final   Coronavirus OC43 NOT DETECTED NOT DETECTED Final   Metapneumovirus NOT DETECTED NOT DETECTED Final   Rhinovirus / Enterovirus NOT DETECTED NOT DETECTED Final   Influenza A NOT DETECTED NOT DETECTED Final   Influenza B NOT DETECTED NOT DETECTED Final   Parainfluenza Virus 1 NOT DETECTED NOT DETECTED Final   Parainfluenza Virus 2 NOT DETECTED NOT DETECTED Final  Parainfluenza Virus 3 NOT DETECTED NOT DETECTED Final   Parainfluenza Virus 4 NOT DETECTED NOT DETECTED Final   Respiratory Syncytial Virus DETECTED (A) NOT DETECTED Final    Comment: CRITICAL RESULT CALLED TO, READ BACK BY AND VERIFIED WITH: BClois Dupes RN [phone] 01/31/2018 T. TYSOR    Bordetella pertussis NOT DETECTED NOT DETECTED Final   Chlamydophila pneumoniae NOT DETECTED NOT DETECTED Final   Mycoplasma pneumoniae NOT DETECTED NOT DETECTED Final    RADIOLOGY STUDIES/RESULTS: Dg Chest 2 View  Result Date: 01/31/2018 CLINICAL DATA:  Short of breath, wheezing, and productive cough for 2 weeks. EXAM: CHEST - 2 VIEW COMPARISON:  10/03/2017 FINDINGS: Prior CABG. Atherosclerotic calcification of the aortic arch. Degenerative glenohumeral arthropathy, right greater than left. Old right anterior rib deformities. Heart size within normal limits. For thoracic compression fractures as shown on 10/10/2017. Airway thickening is present, suggesting bronchitis or reactive airways disease. Large lung  volumes with some flattening of the diaphragms on the lateral projection, potentially from air trapping. IMPRESSION: 1. Airway thickening is present, suggesting bronchitis or reactive airways disease. No airspace opacity identified. 2. Upper thoracic compression fractures similar to those shown on MRI. 3.  Aortic Atherosclerosis (ICD10-I70.0).  Prior CABG. Electronically Signed   By: Gaylyn Rong M.D.   On: 01/31/2018 15:03   Dg Chest Port 1 View  Result Date: 02/04/2018 CLINICAL DATA:  Shortness of Breath EXAM: PORTABLE CHEST 1 VIEW COMPARISON:  01/31/2018 FINDINGS: Cardiac shadow is stable. Postsurgical changes are again seen. The lungs are clear bilaterally. No focal infiltrate or sizable effusion is seen. No acute bony abnormality is noted. IMPRESSION: No active disease. Electronically Signed   By: Alcide Clever M.D.   On: 02/04/2018 09:03     LOS: 3 days   Jeoffrey Massed, MD  [facility]  If 7PM-7AM, please contact night-coverage  Please page via [email or web address]-Password [email or web address]-click on MD name and type text message  02/05/2018, 10:36 AM

## 2018-02-05 NOTE — Care Management Note (Addendum)
Case Management Note  Patient Details  Name: Bernadene Personthel M Corvera MRN: [id number] Date of Birth: 16-Sep-1922  Subjective/Objective:  Admitted with respiratory distress.              Resides alone. DME: walker.  [address]Paul Mitten (Son) TanglewildeSherry Fales ([id number])    [phone]     PCP: Rodrigo RanMark Perini   02/06/2018 @ 1000 Referral made for nebulizer with [facility]. Nebulizer will be delivered to bedside prior to discharge.  Action/Plan: Transition to home when medically stable with home health services to follow. Family to provide 24/7 supervision /assistance once discharged.  Pt has transportation to home.  Expected Discharge Date:                  Expected Discharge Plan:  Home w Home Health Services  In-House Referral:  NA  Discharge planning Services  CM Consult  Post Acute Care Choice:  NA Choice offered to:  patient  DME Arranged:  nebulizer DME Agency:  [facility]  [facility]  Status of Service:  In process, will continue to follow  If discussed at Long Length of Stay Meetings, dates discussed:    Additional Comments:  Epifanio LeschesCole, Danitra Payano Hudson, RN 02/05/2018, 2:00 PM

## 2018-02-05 NOTE — Care Management Important Message (Signed)
Important Message  Patient Details  Name: Wendy Wiley MRN: [id number] Date of Birth: 24-Apr-1922   Medicare Important Message Given:  Yes    Dorena Bodo 02/05/2018, 2:58 PM

## 2018-02-06 DIAGNOSIS — E7800 Pure hypercholesterolemia, unspecified: Secondary | ICD-10-CM

## 2018-02-06 MED ORDER — LEVALBUTEROL HCL 0.63 MG/3ML IN NEBU: 0.6300 mg | INHALATION_SOLUTION | Freq: Four times a day (QID) | RESPIRATORY_TRACT | 12 refills | Status: AC | PRN

## 2018-02-06 MED ORDER — ALBUTEROL SULFATE HFA 108 (90 BASE) MCG/ACT IN AERS: 2.0000 | INHALATION_SPRAY | Freq: Four times a day (QID) | RESPIRATORY_TRACT | 0 refills | Status: DC | PRN

## 2018-02-06 MED ORDER — IPRATROPIUM BROMIDE 0.02 % IN SOLN: 0.5000 mg | Freq: Four times a day (QID) | RESPIRATORY_TRACT | 0 refills | Status: AC | PRN

## 2018-02-06 MED ORDER — FLUTICASONE PROPIONATE 50 MCG/ACT NA SUSP: 2.0000 | Freq: Every day | NASAL | 0 refills | Status: DC

## 2018-02-06 MED ORDER — GUAIFENESIN ER 600 MG PO TB12: 600.0000 mg | ORAL_TABLET | Freq: Two times a day (BID) | ORAL | 0 refills | Status: DC

## 2018-02-06 MED ORDER — PREDNISONE 10 MG PO TABS: ORAL_TABLET | ORAL | 0 refills | Status: DC

## 2018-02-06 MED ORDER — LEVALBUTEROL HCL 0.63 MG/3ML IN NEBU: 0.6300 mg | INHALATION_SOLUTION | Freq: Two times a day (BID) | RESPIRATORY_TRACT | Status: DC

## 2018-02-06 MED ORDER — IPRATROPIUM BROMIDE 0.02 % IN SOLN: 0.5000 mg | Freq: Two times a day (BID) | RESPIRATORY_TRACT | Status: DC

## 2018-02-06 MED ORDER — LORATADINE 10 MG PO TABS: 10.0000 mg | ORAL_TABLET | Freq: Every day | ORAL | 0 refills | Status: DC

## 2018-02-06 NOTE — Discharge Summary (Signed)
PATIENT DETAILS Name: Wendy Wiley Age: 85 y.o. Sex: female Date of Birth: 1922/09/18 MRN: [id number]. Admitting Physician: Starleen Arms, MD WUJ:[id number], Loraine Leriche, MD  Admit Date: 01/31/2018 Discharge date: 02/06/2018  Recommendations for Outpatient Follow-up:  1. Follow up with PCP in 1-2 weeks 2. Please obtain BMP/CBC in one week  Admitted From:  Home  Disposition: Home with home health services   Home Health: No  Equipment/Devices: None  Discharge Condition: Stable  CODE STATUS: FULL CODE  Diet recommendation:  Heart Healthy   Brief Summary: See H&P, Labs, Consult and Test reports for all details in brief,Patient is a 85 y.o. female, CAD, hypothyroidism-presented with shortness of breath/wheezing-found to have acute hypoxic respiratory failure in the setting of acute bronchitis related to RSV infection.  Brief Hospital Course: Acute hypoxic respiratory failure secondary to acute bronchitis due to RSV: Much improved-on admission was not even able to walk more than a few steps to the bathroom from her bed due to shortness of breath.  By day of discharge much better-apparently ambulated around the hall yesterday.  Has been titrated off oxygen.  She was treated with IV steroids, doxycycline and bronchodilators.  She is still wheezing somewhat but markedly better-moving air well and not in any sort of distress.  She is very anxious to go home-her family will provide 24/7-hour care and monitoring.  Will discharge patient on tapering steroids-bronchodilators.  Hopefully over the next few days she will continue to improve further and be back to her usual baseline.  Leukocytosis: Suspect reactive-likely from steroids.    Hypertension:  Controlled-continue Lopressor and amlodipine  CAD: No anginal symptoms-continue aspirin, statin and beta-blocker.   Hypothyroidism: Continue Synthroid  Dyslipidemia: Continue statin  Procedures/Studies: None  Discharge  Diagnoses:  Principal Problem:   Respiratory distress Active Problems:   Coronary atherosclerosis   Essential hypertension   Hyperlipidemia   URI (upper respiratory infection)   Reactive airway disease   RSV (respiratory syncytial virus infection)   Hypokalemia   Discharge Instructions:  Activity:  As tolerated  Discharge Instructions    Diet - low sodium heart healthy   Complete by:  As directed    Discharge instructions   Complete by:  As directed    Follow with Primary MD  Rodrigo Ran, MD in 1 week  Please get a complete blood count and chemistry panel checked by your Primary MD at your next visit, and again as instructed by your Primary MD.  Get Medicines reviewed and adjusted: Please take all your medications with you for your next visit with your Primary MD  Laboratory/radiological data: Please request your Primary MD to go over all hospital tests and procedure/radiological results at the follow up, please ask your Primary MD to get all Hospital records sent to his/her office.  In some cases, they will be blood work, cultures and biopsy results pending at the time of your discharge. Please request that your primary care M.D. follows up on these results.  Also Note the following: If you experience worsening of your admission symptoms, develop shortness of breath, life threatening emergency, suicidal or homicidal thoughts you must seek medical attention immediately by calling 911 or calling your MD immediately  if symptoms less severe.  You must read complete instructions/literature along with all the possible adverse reactions/side effects for all the Medicines you take and that have been prescribed to you. Take any new Medicines after you have completely understood and accpet all the possible adverse reactions/side effects.  Do not drive when taking Pain medications or sleeping medications (Benzodaizepines)  Do not take more than prescribed Pain, Sleep and Anxiety  Medications. It is not advisable to combine anxiety,sleep and pain medications without talking with your primary care practitioner  Special Instructions: If you have smoked or chewed Tobacco  in the last 2 yrs please stop smoking, stop any regular Alcohol  and or any Recreational drug use.  Wear Seat belts while driving.  Please note: You were cared for by a hospitalist during your hospital stay. Once you are discharged, your primary care physician will handle any further medical issues. Please note that NO REFILLS for any discharge medications will be authorized once you are discharged, as it is imperative that you return to your primary care physician (or establish a relationship with a primary care physician if you do not have one) for your post hospital discharge needs so that they can reassess your need for medications and monitor your lab values.   Increase activity slowly   Complete by:  As directed      Allergies as of 02/06/2018      Reactions   Ciprofloxacin Other (See Comments)   Reaction:  Unknown    Codeine Other (See Comments)   Reaction:  Altered mental status    Quinine Swelling      Medication List    STOP taking these medications   HYDROcodone-acetaminophen 5-325 MG tablet Commonly known as:  NORCO/VICODIN   meclizine 12.5 MG tablet Commonly known as:  ANTIVERT     TAKE these medications   albuterol 108 (90 Base) MCG/ACT inhaler Commonly known as:  PROVENTIL HFA;VENTOLIN HFA Inhale 2 puffs into the lungs every 6 (six) hours as needed for wheezing or shortness of breath.   amLODipine 5 MG tablet Commonly known as:  NORVASC TAKE 1 TABLET(5 MG) BY MOUTH DAILY What changed:  See the new instructions.   aspirin EC 81 MG tablet Take 81 mg by mouth at bedtime.   budesonide-formoterol 160-4.5 MCG/ACT inhaler Commonly known as:  SYMBICORT Inhale 2 puffs into the lungs as needed (wheezing).   escitalopram 5 MG tablet Commonly known as:  LEXAPRO Take 5 mg by  mouth at bedtime.   ezetimibe 10 MG tablet Commonly known as:  ZETIA Take 10 mg by mouth at bedtime.   fluticasone 50 MCG/ACT nasal spray Commonly known as:  FLONASE Place 2 sprays into both nostrils daily.   guaiFENesin 600 MG 12 hr tablet Commonly known as:  MUCINEX Take 1 tablet (600 mg total) by mouth 2 (two) times daily.   ipratropium 0.02 % nebulizer solution Commonly known as:  ATROVENT Take 2.5 mLs (0.5 mg total) by nebulization every 6 (six) hours as needed for wheezing or shortness of breath.   levalbuterol 0.63 MG/3ML nebulizer solution Commonly known as:  XOPENEX Take 3 mLs (0.63 mg total) by nebulization every 6 (six) hours as needed for wheezing or shortness of breath.   levothyroxine 200 MCG tablet Commonly known as:  SYNTHROID, LEVOTHROID Take 200 mcg by mouth daily before breakfast.   loratadine 10 MG tablet Commonly known as:  CLARITIN Take 1 tablet (10 mg total) by mouth daily.   metoprolol tartrate 25 MG tablet Commonly known as:  LOPRESSOR Take 25 mg by mouth 2 (two) times daily.   MYRBETRIQ 25 MG Tb24 tablet Generic drug:  mirabegron ER Take 25 mg by mouth daily.   potassium chloride 10 MEQ tablet Commonly known as:  K-DUR,KLOR-CON Take 10 mEq by  mouth daily.   predniSONE 10 MG tablet Commonly known as:  DELTASONE Take 4 tablets (40 mg) daily for 2 days, then, Take 3 tablets (30 mg) daily for 2 days, then, Take 2 tablets (20 mg) daily for 2 days, then, Take 1 tablets (10 mg) daily for 1 days, then stop   PRESERVISION AREDS 2 Caps Take 1 capsule by mouth 2 (two) times daily.            Durable Medical Equipment  (From admission, onward)         Start     Ordered   02/06/18 0918  For home use only DME Nebulizer machine  Once    Question:  Patient needs a nebulizer to treat with the following condition  Answer:  Acute bronchitis   02/06/18 0918         Follow-up Information    [facility] Follow up.   Specialty:   Home Health Services Why:  home health services arranged Contact information: [address] [place] [address] [phone]        Rodrigo Ran, MD. Schedule an appointment as soon as possible for a visit in 1 week(s).   Specialty:  Internal Medicine Contact information: [address] [place] [address] [phone]          Allergies  Allergen Reactions  . Ciprofloxacin Other (See Comments)    Reaction:  Unknown   . Codeine Other (See Comments)    Reaction:  Altered mental status   . Quinine Swelling    Consultations:   None  Other Procedures/Studies: Dg Chest 2 View  Result Date: 01/31/2018 CLINICAL DATA:  Short of breath, wheezing, and productive cough for 2 weeks. EXAM: CHEST - 2 VIEW COMPARISON:  10/03/2017 FINDINGS: Prior CABG. Atherosclerotic calcification of the aortic arch. Degenerative glenohumeral arthropathy, right greater than left. Old right anterior rib deformities. Heart size within normal limits. For thoracic compression fractures as shown on 10/10/2017. Airway thickening is present, suggesting bronchitis or reactive airways disease. Large lung volumes with some flattening of the diaphragms on the lateral projection, potentially from air trapping. IMPRESSION: 1. Airway thickening is present, suggesting bronchitis or reactive airways disease. No airspace opacity identified. 2. Upper thoracic compression fractures similar to those shown on MRI. 3.  Aortic Atherosclerosis (ICD10-I70.0).  Prior CABG. Electronically Signed   By: Gaylyn Rong M.D.   On: 01/31/2018 15:03   Dg Chest Port 1 View  Result Date: 02/04/2018 CLINICAL DATA:  Shortness of Breath EXAM: PORTABLE CHEST 1 VIEW COMPARISON:  01/31/2018 FINDINGS: Cardiac shadow is stable. Postsurgical changes are again seen. The lungs are clear bilaterally. No focal infiltrate or sizable effusion is seen. No acute bony abnormality is noted. IMPRESSION: No active disease. Electronically  Signed   By: Alcide Clever M.D.   On: 02/04/2018 09:03      TODAY-DAY OF DISCHARGE:  Subjective:   Wendy Wiley today has no headache,no chest abdominal pain,no new weakness tingling or numbness, feels much better wants to go home today.   Objective:   Blood pressure (!) 111/52, pulse 67, temperature 98.3 F (36.8 C), temperature source Oral, resp. rate 20, height 4\' 11"  (1.499 m), weight 58.2 kg, SpO2 94 %. No intake or output data in the 24 hours ending 02/06/18 0929 Filed Weights   02/01/18 2013  Weight: 58.2 kg    Exam: Awake Alert, Oriented *3, No new F.N deficits, Normal affect [place].AT,PERRAL Supple Neck,No JVD, No cervical lymphadenopathy appriciated.  Symmetrical Chest wall movement, Good air movement bilaterally, some scattered rhonchi RRR,No Gallops,Rubs or new Murmurs, No Parasternal Heave +ve B.Sounds, Abd Soft, Non tender, No organomegaly appriciated, No rebound -guarding or rigidity. No Cyanosis, Clubbing or edema, No new Rash or bruise   PERTINENT RADIOLOGIC STUDIES: Dg Chest 2 View  Result Date: 01/31/2018 CLINICAL DATA:  Short of breath, wheezing, and productive cough for 2 weeks. EXAM: CHEST - 2 VIEW COMPARISON:  10/03/2017 FINDINGS: Prior CABG. Atherosclerotic calcification of the aortic arch. Degenerative glenohumeral arthropathy, right greater than left. Old right anterior rib deformities. Heart size within normal limits. For thoracic compression fractures as shown on 10/10/2017. Airway thickening is present, suggesting bronchitis or reactive airways disease. Large lung volumes with some flattening of the diaphragms on the lateral projection, potentially from air trapping. IMPRESSION: 1. Airway thickening is present, suggesting bronchitis or reactive airways disease. No airspace opacity identified. 2. Upper thoracic compression fractures similar to those shown on MRI. 3.  Aortic Atherosclerosis (ICD10-I70.0).  Prior CABG. Electronically Signed   By: Gaylyn RongWalter   Liebkemann M.D.   On: 01/31/2018 15:03   Dg Chest Port 1 View  Result Date: 02/04/2018 CLINICAL DATA:  Shortness of Breath EXAM: PORTABLE CHEST 1 VIEW COMPARISON:  01/31/2018 FINDINGS: Cardiac shadow is stable. Postsurgical changes are again seen. The lungs are clear bilaterally. No focal infiltrate or sizable effusion is seen. No acute bony abnormality is noted. IMPRESSION: No active disease. Electronically Signed   By: Alcide CleverMark  Lukens M.D.   On: 02/04/2018 09:03     PERTINENT LAB RESULTS: CBC: Recent Labs    02/04/18 0409 02/05/18 0434  WBC 17.6* 15.8*  HGB 12.5 13.6  HCT 39.0 43.6  PLT 187 197   CMET CMP     Component Value Date/Time   NA 139 02/04/2018 0409   K 4.8 02/04/2018 0409   CL 108 02/04/2018 0409   CO2 25 02/04/2018 0409   GLUCOSE 147 (H) 02/04/2018 0409   BUN 26 (H) 02/04/2018 0409   CREATININE 0.92 02/04/2018 0409   CALCIUM 8.9 02/04/2018 0409   PROT 6.1 08/16/2009 0840   ALBUMIN 3.3 (L) 08/16/2009 0840   AST 23 08/16/2009 0840   ALT 18 08/16/2009 0840   ALKPHOS 66 08/16/2009 0840   BILITOT 1.1 08/16/2009 0840   GFRNONAA 54 (L) 02/04/2018 0409   GFRAA >60 02/04/2018 0409    GFR Estimated Creatinine Clearance: 29.7 mL/min (by C-G formula based on SCr of 0.92 mg/dL). No results for input(s): LIPASE, AMYLASE in the last 72 hours. No results for input(s): CKTOTAL, CKMB, CKMBINDEX, TROPONINI in the last 72 hours. Invalid input(s): POCBNP No results for input(s): DDIMER in the last 72 hours. No results for input(s): HGBA1C in the last 72 hours. No results for input(s): CHOL, HDL, LDLCALC, TRIG, CHOLHDL, LDLDIRECT in the last 72 hours. No results for input(s): TSH, T4TOTAL, T3FREE, THYROIDAB in the last 72 hours.  Invalid input(s): FREET3 No results for input(s): VITAMINB12, FOLATE, FERRITIN, TIBC, IRON, RETICCTPCT in the last 72 hours. Coags: No results for input(s): INR in the last 72 hours.  Invalid input(s): PT Microbiology: Recent Results (from the  past 240 hour(s))  Respiratory Panel by PCR     Status: Abnormal   Collection Time: 01/31/18  5:17 PM  Result Value Ref Range Status   Adenovirus NOT DETECTED NOT DETECTED Final   Coronavirus 229E NOT DETECTED NOT DETECTED Final   Coronavirus HKU1 NOT DETECTED NOT DETECTED Final   Coronavirus NL63 NOT DETECTED NOT DETECTED Final  Coronavirus OC43 NOT DETECTED NOT DETECTED Final   Metapneumovirus NOT DETECTED NOT DETECTED Final   Rhinovirus / Enterovirus NOT DETECTED NOT DETECTED Final   Influenza A NOT DETECTED NOT DETECTED Final   Influenza B NOT DETECTED NOT DETECTED Final   Parainfluenza Virus 1 NOT DETECTED NOT DETECTED Final   Parainfluenza Virus 2 NOT DETECTED NOT DETECTED Final   Parainfluenza Virus 3 NOT DETECTED NOT DETECTED Final   Parainfluenza Virus 4 NOT DETECTED NOT DETECTED Final   Respiratory Syncytial Virus DETECTED (A) NOT DETECTED Final    Comment: CRITICAL RESULT CALLED TO, READ BACK BY AND VERIFIED WITH: BClois Dupes. OLDLAND,CHARGE RN 2323 01/31/2018 T. TYSOR    Bordetella pertussis NOT DETECTED NOT DETECTED Final   Chlamydophila pneumoniae NOT DETECTED NOT DETECTED Final   Mycoplasma pneumoniae NOT DETECTED NOT DETECTED Final    FURTHER DISCHARGE INSTRUCTIONS:  Get Medicines reviewed and adjusted: Please take all your medications with you for your next visit with your Primary MD  Laboratory/radiological data: Please request your Primary MD to go over all hospital tests and procedure/radiological results at the follow up, please ask your Primary MD to get all Hospital records sent to his/her office.  In some cases, they will be blood work, cultures and biopsy results pending at the time of your discharge. Please request that your primary care M.D. goes through all the records of your hospital data and follows up on these results.  Also Note the following: If you experience worsening of your admission symptoms, develop shortness of breath, life threatening emergency,  suicidal or homicidal thoughts you must seek medical attention immediately by calling 911 or calling your MD immediately  if symptoms less severe.  You must read complete instructions/literature along with all the possible adverse reactions/side effects for all the Medicines you take and that have been prescribed to you. Take any new Medicines after you have completely understood and accpet all the possible adverse reactions/side effects.   Do not drive when taking Pain medications or sleeping medications (Benzodaizepines)  Do not take more than prescribed Pain, Sleep and Anxiety Medications. It is not advisable to combine anxiety,sleep and pain medications without talking with your primary care practitioner  Special Instructions: If you have smoked or chewed Tobacco  in the last 2 yrs please stop smoking, stop any regular Alcohol  and or any Recreational drug use.  Wear Seat belts while driving.  Please note: You were cared for by a hospitalist during your hospital stay. Once you are discharged, your primary care physician will handle any further medical issues. Please note that NO REFILLS for any discharge medications will be authorized once you are discharged, as it is imperative that you return to your primary care physician (or establish a relationship with a primary care physician if you do not have one) for your post hospital discharge needs so that they can reassess your need for medications and monitor your lab values.  Total Time spent coordinating discharge including counseling, education and face to face time equals 45 minutes.  SignedJeoffrey Massed: Wendy Wiley 02/06/2018 9:29 AM

## 2018-02-06 NOTE — Progress Notes (Signed)
Pt & family given discharge instructions, prescriptions, and care notes. Pt verbalized understanding AEB no further questions or concerns at this time. IV was discontinued, no redness, pain, or swelling noted at this time. Pt left the floor via wheelchair with volunteer services in stable condition.

## 2018-06-25 ENCOUNTER — Encounter (HOSPITAL_COMMUNITY): Payer: Medicare Other

## 2018-07-02 ENCOUNTER — Other Ambulatory Visit (HOSPITAL_COMMUNITY): Payer: Self-pay | Admitting: *Deleted

## 2018-07-03 ENCOUNTER — Ambulatory Visit (HOSPITAL_COMMUNITY)
Admission: RE | Admit: 2018-07-03 | Discharge: 2018-07-03 | Disposition: A | Discharge status: Home / Self Care | Payer: Medicare Other | Source: Ambulatory Visit | Attending: Internal Medicine | Admitting: Internal Medicine

## 2018-07-03 DIAGNOSIS — M810 Age-related osteoporosis without current pathological fracture: Secondary | ICD-10-CM | POA: Diagnosis present

## 2018-07-03 MED ORDER — DENOSUMAB 60 MG/ML ~~LOC~~ SOSY
PREFILLED_SYRINGE | SUBCUTANEOUS | Status: AC
  Filled 2018-07-03: qty 1

## 2018-07-03 MED ORDER — DENOSUMAB 60 MG/ML ~~LOC~~ SOSY
60.0000 mg | PREFILLED_SYRINGE | Freq: Once | SUBCUTANEOUS | Status: AC
  Administered 2018-07-03: 60 mg via SUBCUTANEOUS

## 2018-07-06 ENCOUNTER — Other Ambulatory Visit: Payer: Self-pay | Admitting: Cardiovascular Disease

## 2018-07-06 NOTE — Telephone Encounter (Signed)
°*  STAT* If patient is at the pharmacy, call can be transferred to refill team.   1. Which medications need to be refilled? (please list name of each medication and dose if known) metoprolol tartrate (LOPRESSOR) 25 MG tablet  2. Which pharmacy/location (including street and city if local pharmacy) is medication to be sent to?  [id number], [place] [phone] (Phone) [phone] (Fax)    3. Do they need a 30 day or 90 day supply? 90 day

## 2018-07-07 MED ORDER — METOPROLOL TARTRATE 25 MG PO TABS: 25.0000 mg | ORAL_TABLET | Freq: Two times a day (BID) | ORAL | 0 refills | Status: DC

## 2018-07-08 ENCOUNTER — Telehealth: Payer: Self-pay | Admitting: Cardiovascular Disease

## 2018-07-08 ENCOUNTER — Telehealth: Payer: Self-pay | Admitting: *Deleted

## 2018-07-08 NOTE — Telephone Encounter (Signed)
Called patient this morning, but, VM was full.

## 2018-07-08 NOTE — Telephone Encounter (Signed)
Unable to leave a message mail box full

## 2018-07-08 NOTE — Telephone Encounter (Signed)
Called patient son and phone just rang.

## 2018-07-29 ENCOUNTER — Other Ambulatory Visit: Payer: Self-pay | Admitting: Cardiovascular Disease

## 2018-08-20 ENCOUNTER — Other Ambulatory Visit: Payer: Self-pay

## 2018-08-20 MED ORDER — METOPROLOL TARTRATE 25 MG PO TABS: 25.0000 mg | ORAL_TABLET | Freq: Two times a day (BID) | ORAL | 1 refills | Status: DC

## 2018-09-02 ENCOUNTER — Other Ambulatory Visit: Payer: Self-pay | Admitting: Cardiovascular Disease

## 2018-09-28 ENCOUNTER — Other Ambulatory Visit: Payer: Self-pay

## 2018-09-28 MED ORDER — AMLODIPINE BESYLATE 5 MG PO TABS: 5.0000 mg | ORAL_TABLET | Freq: Every day | ORAL | 0 refills | Status: DC

## 2018-10-09 ENCOUNTER — Telehealth: Payer: Self-pay

## 2018-10-09 ENCOUNTER — Telehealth (INDEPENDENT_AMBULATORY_CARE_PROVIDER_SITE_OTHER): Payer: Medicare Other | Admitting: Cardiovascular Disease

## 2018-10-09 DIAGNOSIS — I2510 Atherosclerotic heart disease of native coronary artery without angina pectoris: Secondary | ICD-10-CM

## 2018-10-09 DIAGNOSIS — I10 Essential (primary) hypertension: Secondary | ICD-10-CM

## 2018-10-09 DIAGNOSIS — E782 Mixed hyperlipidemia: Secondary | ICD-10-CM | POA: Diagnosis not present

## 2018-10-09 DIAGNOSIS — I2583 Coronary atherosclerosis due to lipid rich plaque: Secondary | ICD-10-CM | POA: Diagnosis not present

## 2018-10-09 NOTE — Telephone Encounter (Signed)
Patient and/or DPR-approved person aware of 9/11 AVS instructions and verbalized understanding.  Letter including After Visit Summary and any other necessary documents to be mailed to the patient's address on file.   

## 2018-10-09 NOTE — Patient Instructions (Signed)

## 2018-10-09 NOTE — Progress Notes (Signed)
Virtual Visit via Telephone Note   This visit type was conducted due to national recommendations for restrictions regarding the COVID-19 Pandemic (e.g. social distancing) in an effort to limit this patient's exposure and mitigate transmission in our community.  Due to her co-morbid illnesses, this patient is at least at moderate risk for complications without adequate follow up.  This format is felt to be most appropriate for this patient at this time.  The patient did not have access to video technology/had technical difficulties with video requiring transitioning to audio format only (telephone).  All issues noted in this document were discussed and addressed.  No physical exam could be performed with this format.  Please refer to the patient's chart for her  consent to telehealth for [facility].   Date:  10/09/2018   ID:  Wendy Wiley, DOB Dec 21, 1922, MRN [id number]  Patient Location: Home Provider Location: Home  PCP:  Crist Infante, MD  Cardiologist: Dr. Donnella Bi Electrophysiologist:  None   Evaluation Performed:  Follow-Up Visit  Chief Complaint: Follow-up CAD, hypertension and hyperlipidemia  History of Present Illness:     Wendy Wiley is a 85 y.o.  widowed Cauc[place] she is the mother of 2, grandmother 5 grandchildren whose husband of 48 years passed away in 11/21/15.  I last saw her in the office 08/05/2017. 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 She has a history of treated hypertension and hyperlipidemia. She had bypass graft in 1999. She's had increasing dyspnea on exertion and some nitroglycerin glycerin responsive chest pain recently and was seen in the emergency room 12/27/16. She ruled out for myocardial infarction. Since I saw her I did obtain a 2-D echo which was normal with an EF is 65-70% and aortic sclerosis with mild elevation in RV systolic pressure. Myoview stress test was normal as well.  These were both done in December 2018.  Carotid  Doppler showed no evidence of ICA stenosis.  Since I saw her a year ago she is done well.  She is completely asymptomatic.  She is sheltering in place and socially distancing.  She denies chest pain or shortness of breath.  The patient does not have symptoms concerning for COVID-19 infection (fever, chills, cough, or new shortness of breath).    Past Medical History:  Diagnosis Date  . Anginal pain ([place])   . Anxiety   . Arthritis   . Depression   . GERD (gastroesophageal reflux disease)   . Hypertension   . Hypothyroidism    Past Surgical History:  Procedure Laterality Date  . APPENDECTOMY     with ovarian cyst removal  . COLONOSCOPY    . CORONARY ARTERY BYPASS GRAFT    . EYE SURGERY     B/L cataract  . OVARIAN CYST REMOVAL    . UPPER GI ENDOSCOPY       No outpatient medications have been marked as taking for the 10/09/18 encounter (Appointment) with Lorretta Harp, MD.     Allergies:   Ciprofloxacin, Codeine, and Quinine   Social History   Tobacco Use  . Smoking status: Never Smoker  . Smokeless tobacco: Never Used  Substance Use Topics  . Alcohol use: No  . Drug use: No     Family Hx: The patient's family history is not on file.  ROS:   Please see the history of present illness.     All other systems reviewed and are negative.   Prior CV studies:  The following studies were reviewed today:  None  Labs/Other Tests and Data Reviewed:    EKG:  No ECG reviewed.  Recent Labs: 01/31/2018: B Natriuretic Peptide 122.3; Magnesium 2.1 02/04/2018: BUN 26; Creatinine, Ser 0.92; Potassium 4.8; Sodium 139 02/05/2018: Hemoglobin 13.6; Platelets 197   Recent Lipid Panel No results found for: CHOL, TRIG, HDL, CHOLHDL, LDLCALC, LDLDIRECT  Wt Readings from Last 3 Encounters:  02/01/18 128 lb 4.9 oz (58.2 kg)  10/03/17 120 lb (54.4 kg)  08/05/17 127 lb 9.6 oz (57.9 kg)     Objective:    Vital Signs:  There were no vitals taken for this visit.   VITAL SIGNS:   reviewed a complete physical exam was not performed today since this was a virtual telemedicine phone visit  ASSESSMENT & PLAN:    1. Coronary artery disease- history of CAD status post CABG in 1999.  Her last Myoview stress test performed 01/14/2017 was nonischemic.  She denies chest pain or shortness of breath. 2. Essential hypertension- history of essential hypertension on metoprolol and Norvasc.  She is unable to check her vital signs at home today. 3. Hyperlipidemia- history of hyperlipidemia on Zetia with lipid profile performed 06/15/2016 revealing total cholesterol 151, LDL of 85 and HDL 50.  COVID-19 Education: The signs and symptoms of COVID-19 were discussed with the patient and how to seek care for testing (follow up with PCP or arrange E-visit).  The importance of social distancing was discussed today.  Time:   Today, I have spent 3 minutes with the patient with telehealth technology discussing the above problems.     Medication Adjustments/Labs and Tests Ordered: Current medicines are reviewed at length with the patient today.  Concerns regarding medicines are outlined above.   Tests Ordered: No orders of the defined types were placed in this encounter.   Medication Changes: No orders of the defined types were placed in this encounter.   Follow Up:  In Person in 1 year(s)  Signed, Nanetta BattyJonathan , MD  10/09/2018 6:47 AM     [facility]

## 2018-11-26 ENCOUNTER — Other Ambulatory Visit: Payer: Self-pay

## 2018-11-26 MED ORDER — AMLODIPINE BESYLATE 5 MG PO TABS: 5.0000 mg | ORAL_TABLET | Freq: Every day | ORAL | 1 refills | Status: DC

## 2018-12-28 ENCOUNTER — Other Ambulatory Visit: Payer: Self-pay

## 2018-12-28 MED ORDER — AMLODIPINE BESYLATE 5 MG PO TABS: 5.0000 mg | ORAL_TABLET | Freq: Every day | ORAL | 2 refills | Status: DC

## 2019-05-05 ENCOUNTER — Other Ambulatory Visit: Payer: Self-pay

## 2019-05-05 MED ORDER — AMLODIPINE BESYLATE 5 MG PO TABS: 5.0000 mg | ORAL_TABLET | Freq: Every day | ORAL | 1 refills | Status: DC

## 2019-06-16 DIAGNOSIS — Z1331 Encounter for screening for depression: Secondary | ICD-10-CM | POA: Diagnosis not present

## 2019-06-16 DIAGNOSIS — J449 Chronic obstructive pulmonary disease, unspecified: Secondary | ICD-10-CM | POA: Diagnosis not present

## 2019-06-16 DIAGNOSIS — M810 Age-related osteoporosis without current pathological fracture: Secondary | ICD-10-CM | POA: Diagnosis not present

## 2019-06-16 DIAGNOSIS — F329 Major depressive disorder, single episode, unspecified: Secondary | ICD-10-CM | POA: Diagnosis not present

## 2019-06-16 DIAGNOSIS — I509 Heart failure, unspecified: Secondary | ICD-10-CM | POA: Diagnosis not present

## 2019-06-16 DIAGNOSIS — E039 Hypothyroidism, unspecified: Secondary | ICD-10-CM | POA: Diagnosis not present

## 2019-06-16 DIAGNOSIS — R197 Diarrhea, unspecified: Secondary | ICD-10-CM | POA: Diagnosis not present

## 2019-06-16 DIAGNOSIS — I10 Essential (primary) hypertension: Secondary | ICD-10-CM | POA: Diagnosis not present

## 2019-06-16 DIAGNOSIS — I2510 Atherosclerotic heart disease of native coronary artery without angina pectoris: Secondary | ICD-10-CM | POA: Diagnosis not present

## 2019-06-17 ENCOUNTER — Ambulatory Visit: Payer: Medicare PPO | Attending: Internal Medicine

## 2019-06-17 DIAGNOSIS — Z23 Encounter for immunization: Secondary | ICD-10-CM

## 2019-06-17 NOTE — Progress Notes (Signed)
   Covid-19 Vaccination Clinic  Name:  Wendy Wiley    MRN: [id number] DOB: 1922/07/29  06/17/2019  [name] was observed post Covid-19 immunization for 15 minutes without incident. She was provided with Vaccine Information Sheet and instruction to access the V-Safe system.   [name] was instructed to call 911 with any severe reactions post vaccine: Marland Kitchen Difficulty breathing  . Swelling of face and throat  . A fast heartbeat  . A bad rash all over body  . Dizziness and weakness   Immunizations Administered    Name Date Dose VIS Date Route   Pfizer COVID-19 Vaccine 06/17/2019  2:42 PM 0.3 mL 03/24/2018 Intramuscular   Manufacturer: [employer], [employer]   Lot: [id number]   NDC: [address]-9

## 2019-06-30 ENCOUNTER — Other Ambulatory Visit: Payer: Self-pay

## 2019-06-30 MED ORDER — AMLODIPINE BESYLATE 5 MG PO TABS: 5.0000 mg | ORAL_TABLET | Freq: Every day | ORAL | 1 refills | Status: DC

## 2019-07-06 ENCOUNTER — Other Ambulatory Visit (HOSPITAL_COMMUNITY): Payer: Self-pay | Admitting: *Deleted

## 2019-07-07 ENCOUNTER — Ambulatory Visit (HOSPITAL_COMMUNITY)
Admission: RE | Admit: 2019-07-07 | Discharge: 2019-07-07 | Disposition: A | Discharge status: Home / Self Care | Payer: Medicare PPO | Source: Ambulatory Visit | Attending: Internal Medicine | Admitting: Internal Medicine

## 2019-07-07 ENCOUNTER — Other Ambulatory Visit: Payer: Self-pay

## 2019-07-07 DIAGNOSIS — M810 Age-related osteoporosis without current pathological fracture: Secondary | ICD-10-CM | POA: Insufficient documentation

## 2019-07-07 MED ORDER — DENOSUMAB 60 MG/ML ~~LOC~~ SOSY
PREFILLED_SYRINGE | SUBCUTANEOUS | Status: AC
  Filled 2019-07-07: qty 1

## 2019-07-07 MED ORDER — DENOSUMAB 60 MG/ML ~~LOC~~ SOSY
60.0000 mg | PREFILLED_SYRINGE | Freq: Once | SUBCUTANEOUS | Status: AC
  Administered 2019-07-07: 60 mg via SUBCUTANEOUS

## 2019-07-12 ENCOUNTER — Ambulatory Visit: Payer: Medicare PPO | Attending: Internal Medicine

## 2019-07-12 DIAGNOSIS — Z23 Encounter for immunization: Secondary | ICD-10-CM

## 2019-07-12 NOTE — Progress Notes (Signed)
   Covid-19 Vaccination Clinic  Name:  MARCA GADSBY    MRN: [id number] DOB: 11-Jul-1922  07/12/2019  Ms. Krall was observed post Covid-19 immunization for 15 minutes without incident. She was provided with Vaccine Information Sheet and instruction to access the V-Safe system.   Ms. Lynk was instructed to call 911 with any severe reactions post vaccine: Marland Kitchen Difficulty breathing  . Swelling of face and throat  . A fast heartbeat  . A bad rash all over body  . Dizziness and weakness   Immunizations Administered    Name Date Dose VIS Date Route   Pfizer COVID-19 Vaccine 07/12/2019  2:26 PM 0.3 mL 03/24/2018 Intramuscular   Manufacturer: [employer], [employer]   Lot: [id number]   NDC: [address]-4

## 2019-10-29 DIAGNOSIS — I10 Essential (primary) hypertension: Secondary | ICD-10-CM | POA: Diagnosis not present

## 2019-10-29 DIAGNOSIS — J449 Chronic obstructive pulmonary disease, unspecified: Secondary | ICD-10-CM | POA: Diagnosis not present

## 2019-10-29 DIAGNOSIS — N3281 Overactive bladder: Secondary | ICD-10-CM | POA: Diagnosis not present

## 2019-10-29 DIAGNOSIS — M810 Age-related osteoporosis without current pathological fracture: Secondary | ICD-10-CM | POA: Diagnosis not present

## 2019-10-29 DIAGNOSIS — I509 Heart failure, unspecified: Secondary | ICD-10-CM | POA: Diagnosis not present

## 2019-10-29 DIAGNOSIS — Z23 Encounter for immunization: Secondary | ICD-10-CM | POA: Diagnosis not present

## 2019-10-29 DIAGNOSIS — D72829 Elevated white blood cell count, unspecified: Secondary | ICD-10-CM | POA: Diagnosis not present

## 2019-10-29 DIAGNOSIS — E039 Hypothyroidism, unspecified: Secondary | ICD-10-CM | POA: Diagnosis not present

## 2020-01-01 ENCOUNTER — Other Ambulatory Visit: Payer: Self-pay | Admitting: Cardiovascular Disease

## 2020-02-17 IMAGING — CR DG CHEST 2V
2 series · 2 of 2 positions shown · non-contrast
Comparison: 10/03/2017

CLINICAL DATA: Short of breath, wheezing, and productive cough for
2 weeks.

EXAM:
CHEST - 2 VIEW

[chest pa]
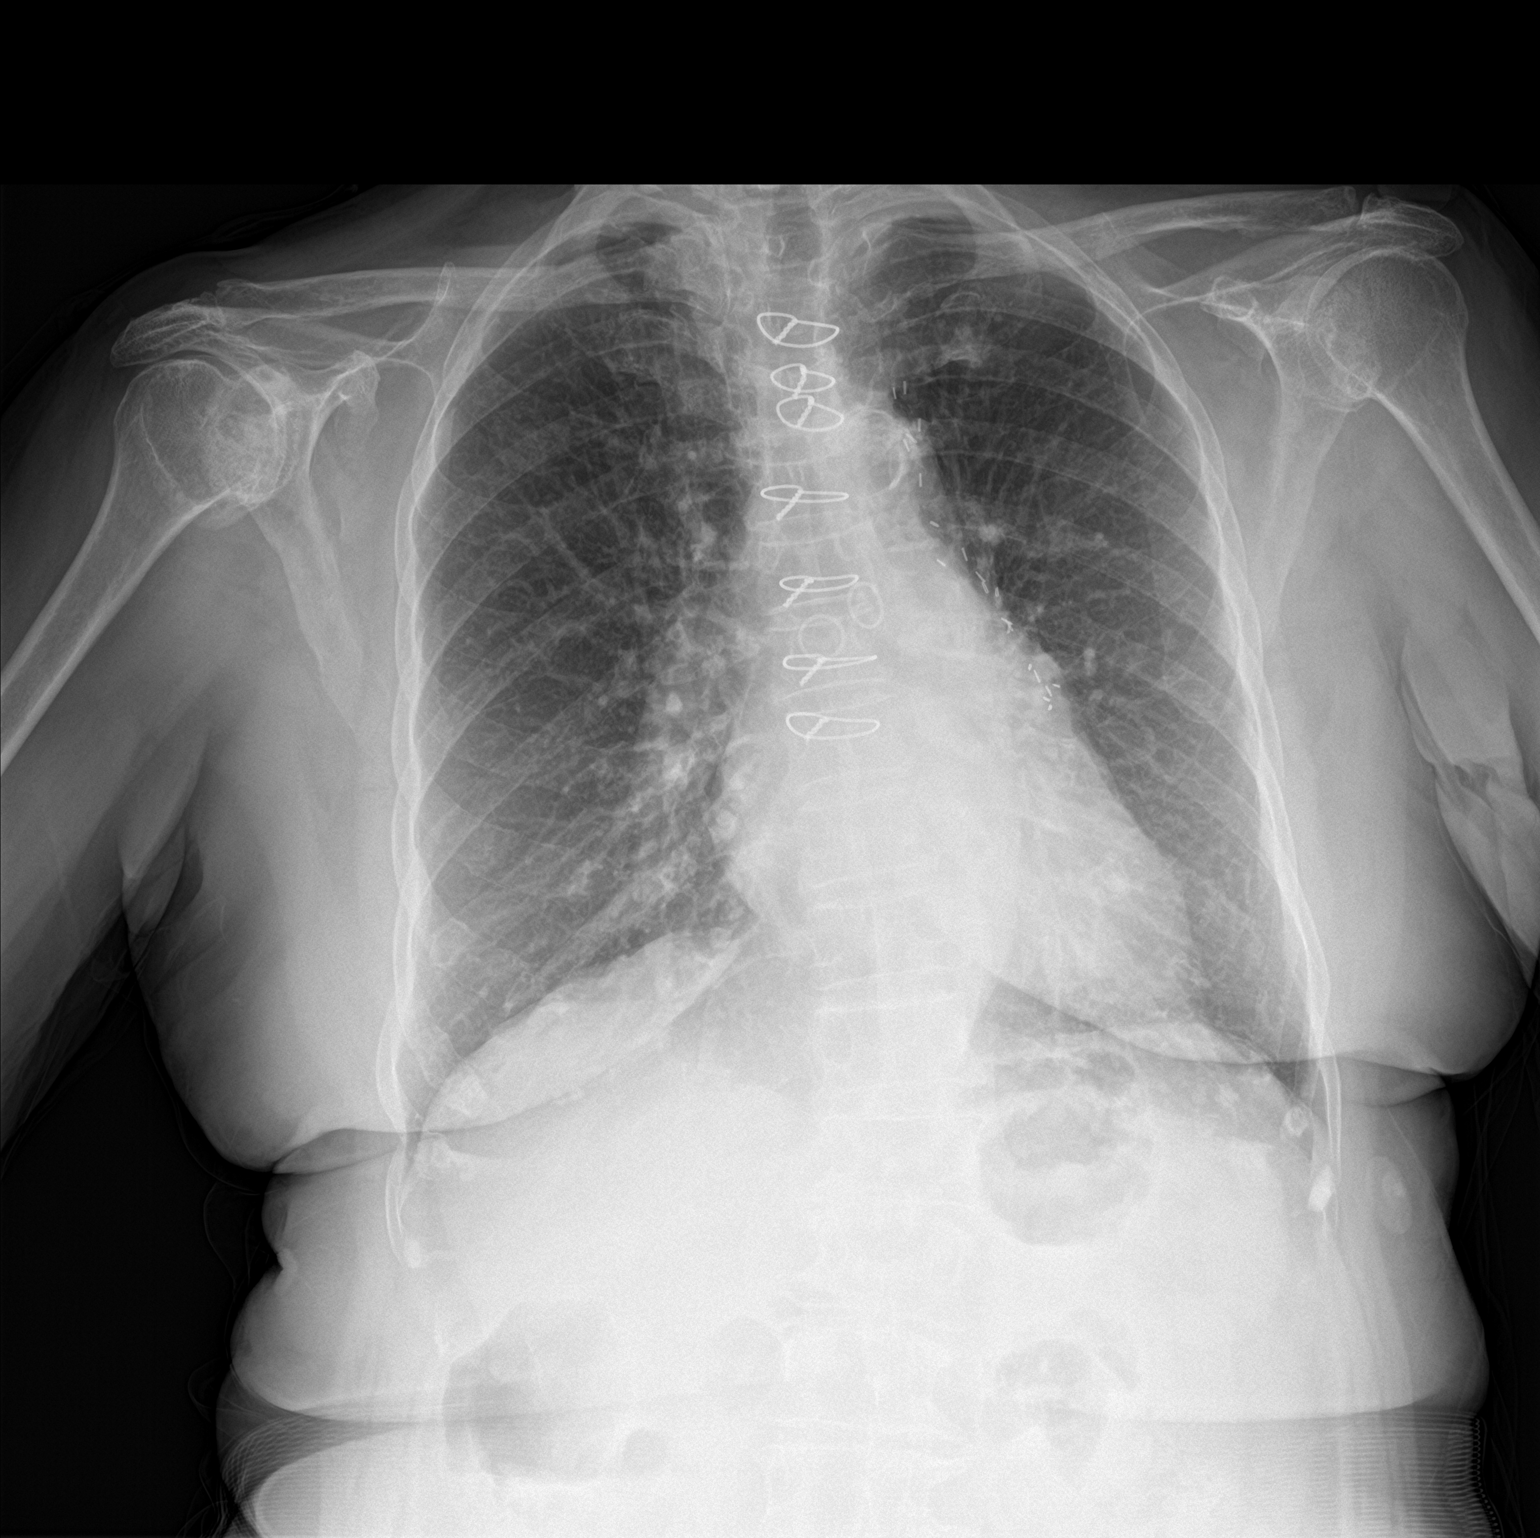

[chest lat]
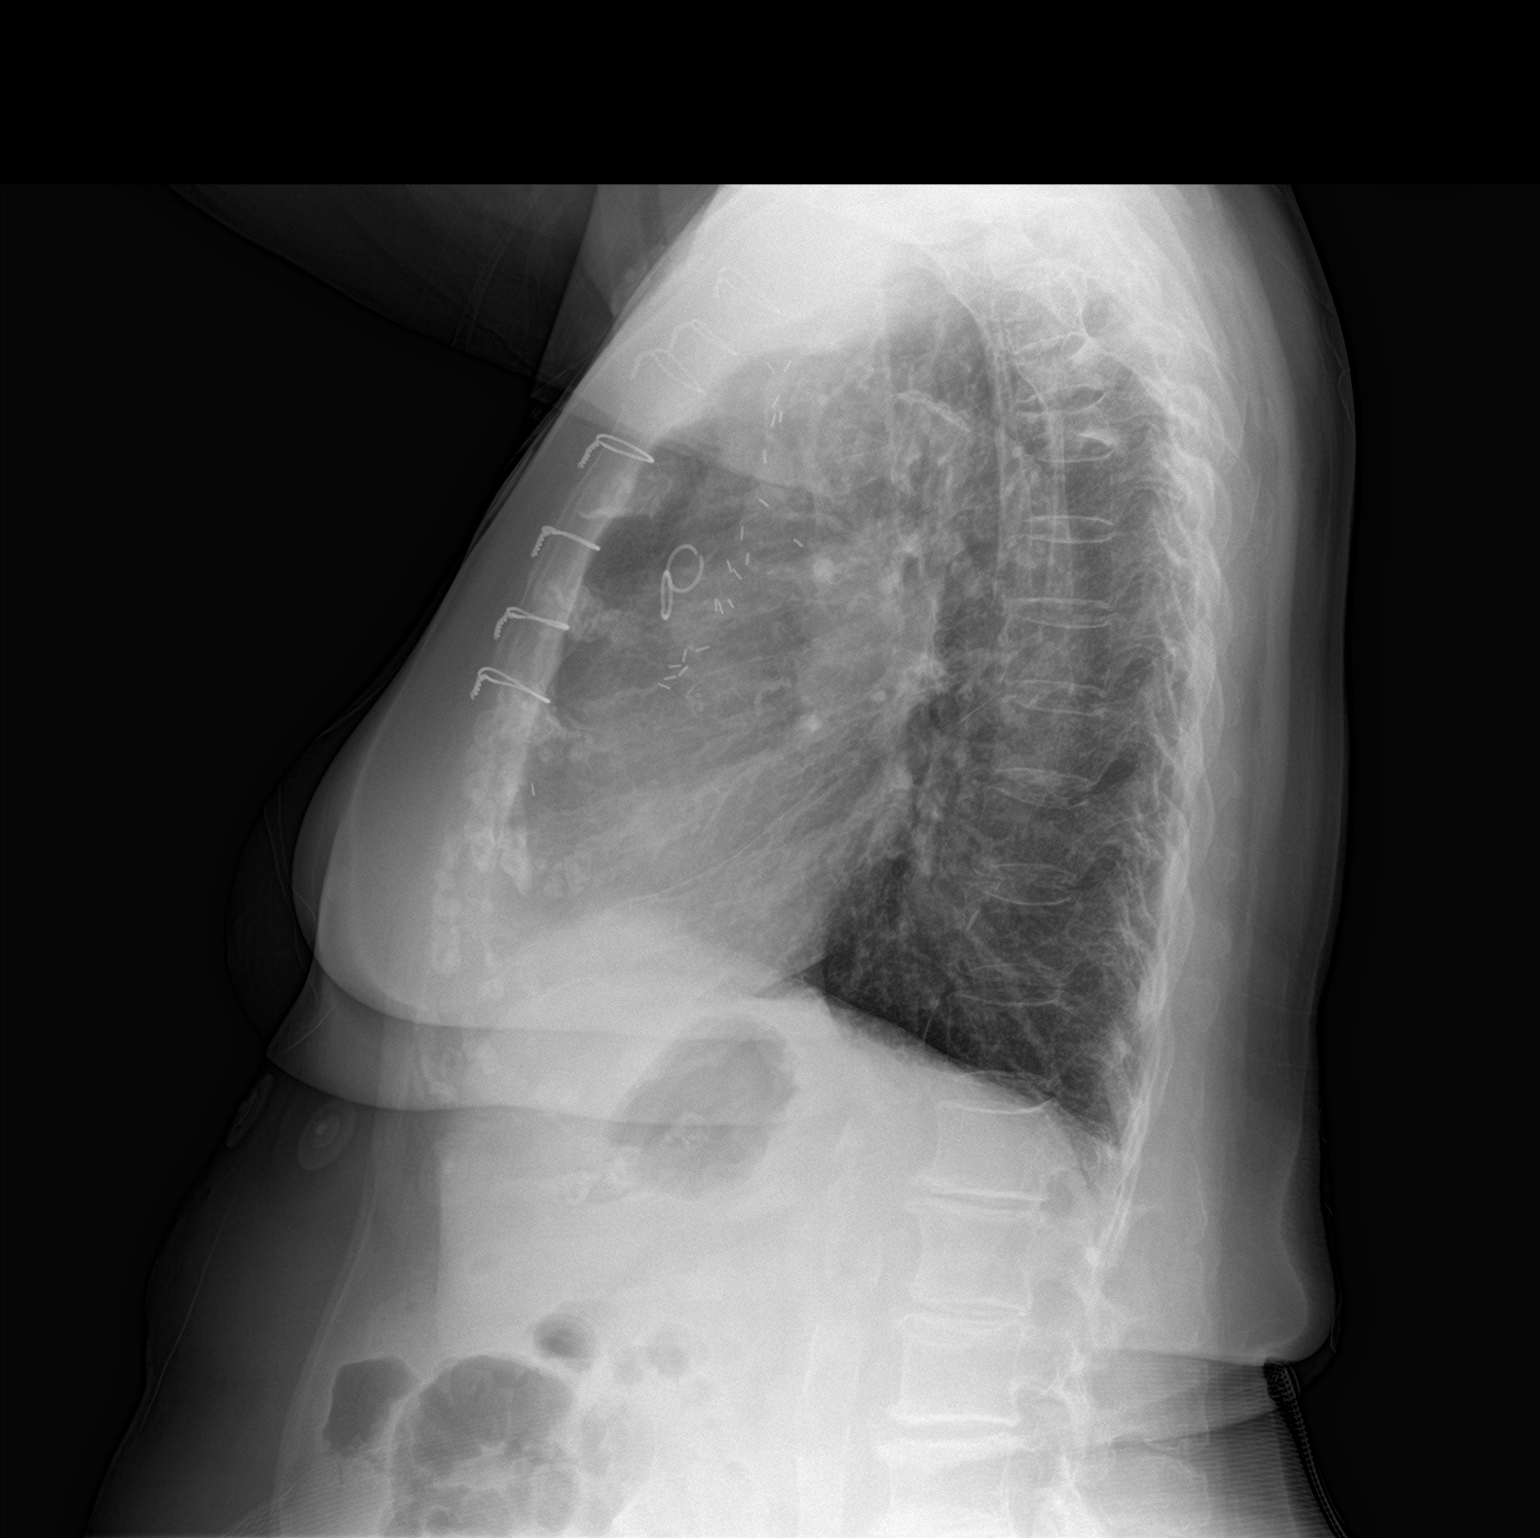

[2 of 2 positions shown; findings below may reference images not displayed]

FINDINGS: Prior CABG. Atherosclerotic calcification of the aortic arch.
Degenerative glenohumeral arthropathy, right greater than left. Old
right anterior rib deformities.

Heart size within normal limits. For thoracic compression fractures
as shown on 10/10/2017.

Airway thickening is present, suggesting bronchitis or reactive
airways disease. Large lung volumes with some flattening of the
diaphragms on the lateral projection, potentially from air trapping.
IMPRESSION: 1. Airway thickening is present, suggesting bronchitis or reactive
airways disease. No airspace opacity identified.
2. Upper thoracic compression fractures similar to those shown on
MRI.
3.  Aortic Atherosclerosis ([id number]).  Prior CABG.

## 2020-03-01 DIAGNOSIS — J449 Chronic obstructive pulmonary disease, unspecified: Secondary | ICD-10-CM | POA: Diagnosis not present

## 2020-03-01 DIAGNOSIS — I2510 Atherosclerotic heart disease of native coronary artery without angina pectoris: Secondary | ICD-10-CM | POA: Diagnosis not present

## 2020-03-01 DIAGNOSIS — D72829 Elevated white blood cell count, unspecified: Secondary | ICD-10-CM | POA: Diagnosis not present

## 2020-03-01 DIAGNOSIS — E039 Hypothyroidism, unspecified: Secondary | ICD-10-CM | POA: Diagnosis not present

## 2020-03-01 DIAGNOSIS — I509 Heart failure, unspecified: Secondary | ICD-10-CM | POA: Diagnosis not present

## 2020-03-01 DIAGNOSIS — I10 Essential (primary) hypertension: Secondary | ICD-10-CM | POA: Diagnosis not present

## 2020-03-01 DIAGNOSIS — F329 Major depressive disorder, single episode, unspecified: Secondary | ICD-10-CM | POA: Diagnosis not present

## 2020-03-01 DIAGNOSIS — K219 Gastro-esophageal reflux disease without esophagitis: Secondary | ICD-10-CM | POA: Diagnosis not present

## 2020-03-01 DIAGNOSIS — M810 Age-related osteoporosis without current pathological fracture: Secondary | ICD-10-CM | POA: Diagnosis not present

## 2020-03-21 ENCOUNTER — Other Ambulatory Visit (HOSPITAL_COMMUNITY): Payer: Self-pay | Admitting: *Deleted

## 2020-03-22 ENCOUNTER — Other Ambulatory Visit: Payer: Self-pay

## 2020-03-22 ENCOUNTER — Ambulatory Visit (HOSPITAL_COMMUNITY)
Admission: RE | Admit: 2020-03-22 | Discharge: 2020-03-22 | Disposition: A | Discharge status: Home / Self Care | Payer: Medicare PPO | Source: Ambulatory Visit | Attending: Internal Medicine | Admitting: Internal Medicine

## 2020-03-22 DIAGNOSIS — M810 Age-related osteoporosis without current pathological fracture: Secondary | ICD-10-CM | POA: Insufficient documentation

## 2020-03-22 MED ORDER — DENOSUMAB 60 MG/ML ~~LOC~~ SOSY
PREFILLED_SYRINGE | SUBCUTANEOUS | Status: AC
  Administered 2020-03-22: 60 mg via SUBCUTANEOUS
  Filled 2020-03-22: qty 1

## 2020-03-22 MED ORDER — DENOSUMAB 60 MG/ML ~~LOC~~ SOSY: 60.0000 mg | PREFILLED_SYRINGE | Freq: Once | SUBCUTANEOUS | Status: AC

## 2020-04-13 DIAGNOSIS — H903 Sensorineural hearing loss, bilateral: Secondary | ICD-10-CM | POA: Diagnosis not present

## 2020-06-30 DIAGNOSIS — E039 Hypothyroidism, unspecified: Secondary | ICD-10-CM | POA: Diagnosis not present

## 2020-06-30 DIAGNOSIS — F329 Major depressive disorder, single episode, unspecified: Secondary | ICD-10-CM | POA: Diagnosis not present

## 2020-06-30 DIAGNOSIS — D72829 Elevated white blood cell count, unspecified: Secondary | ICD-10-CM | POA: Diagnosis not present

## 2020-06-30 DIAGNOSIS — M810 Age-related osteoporosis without current pathological fracture: Secondary | ICD-10-CM | POA: Diagnosis not present

## 2020-06-30 DIAGNOSIS — I2510 Atherosclerotic heart disease of native coronary artery without angina pectoris: Secondary | ICD-10-CM | POA: Diagnosis not present

## 2020-06-30 DIAGNOSIS — N3281 Overactive bladder: Secondary | ICD-10-CM | POA: Diagnosis not present

## 2020-06-30 DIAGNOSIS — I509 Heart failure, unspecified: Secondary | ICD-10-CM | POA: Diagnosis not present

## 2020-06-30 DIAGNOSIS — I10 Essential (primary) hypertension: Secondary | ICD-10-CM | POA: Diagnosis not present

## 2020-06-30 DIAGNOSIS — J449 Chronic obstructive pulmonary disease, unspecified: Secondary | ICD-10-CM | POA: Diagnosis not present

## 2020-10-05 ENCOUNTER — Other Ambulatory Visit (HOSPITAL_COMMUNITY): Payer: Self-pay | Admitting: *Deleted

## 2020-10-06 ENCOUNTER — Ambulatory Visit (HOSPITAL_COMMUNITY)
Admission: RE | Admit: 2020-10-06 | Discharge: 2020-10-06 | Disposition: A | Discharge status: Home / Self Care | Payer: Medicare PPO | Source: Ambulatory Visit | Attending: Internal Medicine | Admitting: Internal Medicine

## 2020-10-06 ENCOUNTER — Other Ambulatory Visit: Payer: Self-pay

## 2020-10-06 DIAGNOSIS — M810 Age-related osteoporosis without current pathological fracture: Secondary | ICD-10-CM | POA: Diagnosis not present

## 2020-10-06 MED ORDER — DENOSUMAB 60 MG/ML ~~LOC~~ SOSY: 60.0000 mg | PREFILLED_SYRINGE | Freq: Once | SUBCUTANEOUS | Status: AC

## 2020-10-06 MED ORDER — DENOSUMAB 60 MG/ML ~~LOC~~ SOSY
PREFILLED_SYRINGE | SUBCUTANEOUS | Status: AC
  Administered 2020-10-06: 60 mg via SUBCUTANEOUS
  Filled 2020-10-06: qty 1

## 2020-11-02 DIAGNOSIS — I2510 Atherosclerotic heart disease of native coronary artery without angina pectoris: Secondary | ICD-10-CM | POA: Diagnosis not present

## 2020-11-02 DIAGNOSIS — E039 Hypothyroidism, unspecified: Secondary | ICD-10-CM | POA: Diagnosis not present

## 2020-11-02 DIAGNOSIS — I509 Heart failure, unspecified: Secondary | ICD-10-CM | POA: Diagnosis not present

## 2020-11-02 DIAGNOSIS — F329 Major depressive disorder, single episode, unspecified: Secondary | ICD-10-CM | POA: Diagnosis not present

## 2020-11-02 DIAGNOSIS — Z23 Encounter for immunization: Secondary | ICD-10-CM | POA: Diagnosis not present

## 2020-11-02 DIAGNOSIS — J449 Chronic obstructive pulmonary disease, unspecified: Secondary | ICD-10-CM | POA: Diagnosis not present

## 2020-11-02 DIAGNOSIS — M810 Age-related osteoporosis without current pathological fracture: Secondary | ICD-10-CM | POA: Diagnosis not present

## 2020-11-02 DIAGNOSIS — I10 Essential (primary) hypertension: Secondary | ICD-10-CM | POA: Diagnosis not present

## 2020-11-02 DIAGNOSIS — N3281 Overactive bladder: Secondary | ICD-10-CM | POA: Diagnosis not present

## 2021-04-24 ENCOUNTER — Other Ambulatory Visit (HOSPITAL_COMMUNITY): Payer: Self-pay | Admitting: *Deleted

## 2021-04-25 ENCOUNTER — Ambulatory Visit (HOSPITAL_COMMUNITY)
Admission: RE | Admit: 2021-04-25 | Discharge: 2021-04-25 | Disposition: A | Discharge status: Home / Self Care | Payer: Medicare PPO | Source: Ambulatory Visit | Attending: Internal Medicine | Admitting: Internal Medicine

## 2021-04-25 DIAGNOSIS — M810 Age-related osteoporosis without current pathological fracture: Secondary | ICD-10-CM | POA: Diagnosis not present

## 2021-04-25 MED ORDER — DENOSUMAB 60 MG/ML ~~LOC~~ SOSY
PREFILLED_SYRINGE | SUBCUTANEOUS | Status: AC
  Administered 2021-04-25: 60 mg via SUBCUTANEOUS
  Filled 2021-04-25: qty 1

## 2021-04-25 MED ORDER — DENOSUMAB 60 MG/ML ~~LOC~~ SOSY: 60.0000 mg | PREFILLED_SYRINGE | Freq: Once | SUBCUTANEOUS | Status: AC

## 2021-05-07 DIAGNOSIS — J449 Chronic obstructive pulmonary disease, unspecified: Secondary | ICD-10-CM | POA: Diagnosis not present

## 2021-05-07 DIAGNOSIS — I2510 Atherosclerotic heart disease of native coronary artery without angina pectoris: Secondary | ICD-10-CM | POA: Diagnosis not present

## 2021-05-07 DIAGNOSIS — E039 Hypothyroidism, unspecified: Secondary | ICD-10-CM | POA: Diagnosis not present

## 2021-05-07 DIAGNOSIS — I10 Essential (primary) hypertension: Secondary | ICD-10-CM | POA: Diagnosis not present

## 2021-05-07 DIAGNOSIS — F329 Major depressive disorder, single episode, unspecified: Secondary | ICD-10-CM | POA: Diagnosis not present

## 2021-05-07 DIAGNOSIS — D72829 Elevated white blood cell count, unspecified: Secondary | ICD-10-CM | POA: Diagnosis not present

## 2021-05-07 DIAGNOSIS — M810 Age-related osteoporosis without current pathological fracture: Secondary | ICD-10-CM | POA: Diagnosis not present

## 2021-05-07 DIAGNOSIS — I509 Heart failure, unspecified: Secondary | ICD-10-CM | POA: Diagnosis not present

## 2021-05-07 DIAGNOSIS — N3281 Overactive bladder: Secondary | ICD-10-CM | POA: Diagnosis not present

## 2021-11-29 DIAGNOSIS — I2510 Atherosclerotic heart disease of native coronary artery without angina pectoris: Secondary | ICD-10-CM | POA: Diagnosis not present

## 2021-11-29 DIAGNOSIS — I509 Heart failure, unspecified: Secondary | ICD-10-CM | POA: Diagnosis not present

## 2021-11-29 DIAGNOSIS — Z23 Encounter for immunization: Secondary | ICD-10-CM | POA: Diagnosis not present

## 2021-11-29 DIAGNOSIS — M810 Age-related osteoporosis without current pathological fracture: Secondary | ICD-10-CM | POA: Diagnosis not present

## 2021-11-29 DIAGNOSIS — E039 Hypothyroidism, unspecified: Secondary | ICD-10-CM | POA: Diagnosis not present

## 2021-11-29 DIAGNOSIS — K219 Gastro-esophageal reflux disease without esophagitis: Secondary | ICD-10-CM | POA: Diagnosis not present

## 2021-11-29 DIAGNOSIS — J449 Chronic obstructive pulmonary disease, unspecified: Secondary | ICD-10-CM | POA: Diagnosis not present

## 2021-11-29 DIAGNOSIS — I110 Hypertensive heart disease with heart failure: Secondary | ICD-10-CM | POA: Diagnosis not present

## 2021-12-25 ENCOUNTER — Other Ambulatory Visit (HOSPITAL_COMMUNITY): Payer: Self-pay

## 2021-12-26 ENCOUNTER — Ambulatory Visit (HOSPITAL_COMMUNITY)
Admission: RE | Admit: 2021-12-26 | Discharge: 2021-12-26 | Disposition: A | Discharge status: Home / Self Care | Payer: Medicare PPO | Source: Ambulatory Visit | Attending: Internal Medicine | Admitting: Internal Medicine

## 2021-12-26 DIAGNOSIS — M810 Age-related osteoporosis without current pathological fracture: Secondary | ICD-10-CM | POA: Insufficient documentation

## 2021-12-26 MED ORDER — DENOSUMAB 60 MG/ML ~~LOC~~ SOSY
PREFILLED_SYRINGE | SUBCUTANEOUS | Status: AC
  Filled 2021-12-26: qty 1

## 2021-12-26 MED ORDER — DENOSUMAB 60 MG/ML ~~LOC~~ SOSY
60.0000 mg | PREFILLED_SYRINGE | Freq: Once | SUBCUTANEOUS | Status: AC
  Administered 2021-12-26: 60 mg via SUBCUTANEOUS

## 2022-07-30 DIAGNOSIS — J309 Allergic rhinitis, unspecified: Secondary | ICD-10-CM | POA: Diagnosis not present

## 2022-07-30 DIAGNOSIS — E039 Hypothyroidism, unspecified: Secondary | ICD-10-CM | POA: Diagnosis not present

## 2022-07-30 DIAGNOSIS — I509 Heart failure, unspecified: Secondary | ICD-10-CM | POA: Diagnosis not present

## 2022-07-30 DIAGNOSIS — R413 Other amnesia: Secondary | ICD-10-CM | POA: Diagnosis not present

## 2022-07-30 DIAGNOSIS — M810 Age-related osteoporosis without current pathological fracture: Secondary | ICD-10-CM | POA: Diagnosis not present

## 2022-07-30 DIAGNOSIS — I110 Hypertensive heart disease with heart failure: Secondary | ICD-10-CM | POA: Diagnosis not present

## 2022-07-30 DIAGNOSIS — I2510 Atherosclerotic heart disease of native coronary artery without angina pectoris: Secondary | ICD-10-CM | POA: Diagnosis not present

## 2022-07-30 DIAGNOSIS — J449 Chronic obstructive pulmonary disease, unspecified: Secondary | ICD-10-CM | POA: Diagnosis not present

## 2022-08-14 ENCOUNTER — Other Ambulatory Visit (HOSPITAL_COMMUNITY): Payer: Self-pay | Admitting: *Deleted

## 2022-08-19 ENCOUNTER — Ambulatory Visit (HOSPITAL_COMMUNITY)
Admission: RE | Admit: 2022-08-19 | Discharge: 2022-08-19 | Disposition: A | Discharge status: Home / Self Care | Payer: Medicare PPO | Source: Ambulatory Visit | Attending: Internal Medicine | Admitting: Internal Medicine

## 2022-08-19 DIAGNOSIS — M810 Age-related osteoporosis without current pathological fracture: Secondary | ICD-10-CM | POA: Diagnosis not present

## 2022-08-19 MED ORDER — DENOSUMAB 60 MG/ML ~~LOC~~ SOSY
60.0000 mg | PREFILLED_SYRINGE | Freq: Once | SUBCUTANEOUS | Status: AC
  Administered 2022-08-19: 60 mg via SUBCUTANEOUS

## 2022-08-19 MED ORDER — DENOSUMAB 60 MG/ML ~~LOC~~ SOSY
PREFILLED_SYRINGE | SUBCUTANEOUS | Status: AC
  Filled 2022-08-19: qty 1

## 2022-10-01 DIAGNOSIS — M810 Age-related osteoporosis without current pathological fracture: Secondary | ICD-10-CM | POA: Diagnosis not present

## 2022-10-01 DIAGNOSIS — E538 Deficiency of other specified B group vitamins: Secondary | ICD-10-CM | POA: Diagnosis not present

## 2022-10-25 DIAGNOSIS — H353133 Nonexudative age-related macular degeneration, bilateral, advanced atrophic without subfoveal involvement: Secondary | ICD-10-CM | POA: Diagnosis not present

## 2022-12-24 DIAGNOSIS — E538 Deficiency of other specified B group vitamins: Secondary | ICD-10-CM | POA: Diagnosis not present

## 2023-01-08 DIAGNOSIS — R051 Acute cough: Secondary | ICD-10-CM | POA: Diagnosis not present

## 2023-01-08 DIAGNOSIS — R062 Wheezing: Secondary | ICD-10-CM | POA: Diagnosis not present

## 2023-01-08 DIAGNOSIS — J449 Chronic obstructive pulmonary disease, unspecified: Secondary | ICD-10-CM | POA: Diagnosis not present

## 2023-01-08 DIAGNOSIS — J209 Acute bronchitis, unspecified: Secondary | ICD-10-CM | POA: Diagnosis not present

## 2023-02-14 DIAGNOSIS — J449 Chronic obstructive pulmonary disease, unspecified: Secondary | ICD-10-CM | POA: Diagnosis not present

## 2023-03-10 DIAGNOSIS — I2510 Atherosclerotic heart disease of native coronary artery without angina pectoris: Secondary | ICD-10-CM | POA: Diagnosis not present

## 2023-03-10 DIAGNOSIS — J449 Chronic obstructive pulmonary disease, unspecified: Secondary | ICD-10-CM | POA: Diagnosis not present

## 2023-03-10 DIAGNOSIS — Z23 Encounter for immunization: Secondary | ICD-10-CM | POA: Diagnosis not present

## 2023-03-10 DIAGNOSIS — I110 Hypertensive heart disease with heart failure: Secondary | ICD-10-CM | POA: Diagnosis not present

## 2023-03-10 DIAGNOSIS — R413 Other amnesia: Secondary | ICD-10-CM | POA: Diagnosis not present

## 2023-03-10 DIAGNOSIS — E039 Hypothyroidism, unspecified: Secondary | ICD-10-CM | POA: Diagnosis not present

## 2023-03-10 DIAGNOSIS — E559 Vitamin D deficiency, unspecified: Secondary | ICD-10-CM | POA: Diagnosis not present

## 2023-03-10 DIAGNOSIS — I509 Heart failure, unspecified: Secondary | ICD-10-CM | POA: Diagnosis not present

## 2023-07-08 DIAGNOSIS — E538 Deficiency of other specified B group vitamins: Secondary | ICD-10-CM | POA: Diagnosis not present

## 2023-07-14 ENCOUNTER — Encounter (HOSPITAL_COMMUNITY): Payer: Self-pay | Admitting: Internal Medicine

## 2023-07-14 ENCOUNTER — Other Ambulatory Visit: Payer: Self-pay

## 2023-07-14 ENCOUNTER — Inpatient Hospital Stay (HOSPITAL_COMMUNITY)
Admission: EM | Admit: 2023-07-14 | Discharge: 2023-07-18 | DRG: 371 | Disposition: A | Discharge status: Skilled Nursing Facility | Attending: Internal Medicine | Admitting: Internal Medicine

## 2023-07-14 ENCOUNTER — Emergency Department (HOSPITAL_COMMUNITY)

## 2023-07-14 DIAGNOSIS — Z66 Do not resuscitate: Secondary | ICD-10-CM | POA: Diagnosis not present

## 2023-07-14 DIAGNOSIS — I493 Ventricular premature depolarization: Secondary | ICD-10-CM | POA: Diagnosis present

## 2023-07-14 DIAGNOSIS — R531 Weakness: Secondary | ICD-10-CM | POA: Diagnosis not present

## 2023-07-14 DIAGNOSIS — R627 Adult failure to thrive: Secondary | ICD-10-CM | POA: Diagnosis present

## 2023-07-14 DIAGNOSIS — I5032 Chronic diastolic (congestive) heart failure: Secondary | ICD-10-CM | POA: Diagnosis present

## 2023-07-14 DIAGNOSIS — N1831 Chronic kidney disease, stage 3a: Secondary | ICD-10-CM | POA: Diagnosis present

## 2023-07-14 DIAGNOSIS — Z880 Allergy status to penicillin: Secondary | ICD-10-CM

## 2023-07-14 DIAGNOSIS — F32A Depression, unspecified: Secondary | ICD-10-CM | POA: Diagnosis present

## 2023-07-14 DIAGNOSIS — I492 Junctional premature depolarization: Secondary | ICD-10-CM | POA: Diagnosis present

## 2023-07-14 DIAGNOSIS — D72829 Elevated white blood cell count, unspecified: Secondary | ICD-10-CM | POA: Diagnosis present

## 2023-07-14 DIAGNOSIS — Z79899 Other long term (current) drug therapy: Secondary | ICD-10-CM

## 2023-07-14 DIAGNOSIS — E782 Mixed hyperlipidemia: Secondary | ICD-10-CM

## 2023-07-14 DIAGNOSIS — E039 Hypothyroidism, unspecified: Secondary | ICD-10-CM | POA: Diagnosis present

## 2023-07-14 DIAGNOSIS — I10 Essential (primary) hypertension: Secondary | ICD-10-CM | POA: Diagnosis not present

## 2023-07-14 DIAGNOSIS — E860 Dehydration: Secondary | ICD-10-CM | POA: Diagnosis present

## 2023-07-14 DIAGNOSIS — R001 Bradycardia, unspecified: Secondary | ICD-10-CM | POA: Diagnosis present

## 2023-07-14 DIAGNOSIS — R0902 Hypoxemia: Secondary | ICD-10-CM | POA: Diagnosis not present

## 2023-07-14 DIAGNOSIS — J9811 Atelectasis: Secondary | ICD-10-CM | POA: Diagnosis present

## 2023-07-14 DIAGNOSIS — F0280 Dementia in other diseases classified elsewhere without behavioral disturbance: Secondary | ICD-10-CM | POA: Diagnosis not present

## 2023-07-14 DIAGNOSIS — K529 Noninfective gastroenteritis and colitis, unspecified: Principal | ICD-10-CM | POA: Diagnosis present

## 2023-07-14 DIAGNOSIS — I21A1 Myocardial infarction type 2: Secondary | ICD-10-CM | POA: Diagnosis present

## 2023-07-14 DIAGNOSIS — Z951 Presence of aortocoronary bypass graft: Secondary | ICD-10-CM

## 2023-07-14 DIAGNOSIS — Z881 Allergy status to other antibiotic agents status: Secondary | ICD-10-CM

## 2023-07-14 DIAGNOSIS — I4891 Unspecified atrial fibrillation: Secondary | ICD-10-CM | POA: Diagnosis not present

## 2023-07-14 DIAGNOSIS — J449 Chronic obstructive pulmonary disease, unspecified: Secondary | ICD-10-CM | POA: Diagnosis present

## 2023-07-14 DIAGNOSIS — F0390 Unspecified dementia without behavioral disturbance: Secondary | ICD-10-CM | POA: Diagnosis not present

## 2023-07-14 DIAGNOSIS — R0989 Other specified symptoms and signs involving the circulatory and respiratory systems: Secondary | ICD-10-CM | POA: Diagnosis not present

## 2023-07-14 DIAGNOSIS — D631 Anemia in chronic kidney disease: Secondary | ICD-10-CM | POA: Diagnosis present

## 2023-07-14 DIAGNOSIS — I5189 Other ill-defined heart diseases: Secondary | ICD-10-CM | POA: Diagnosis not present

## 2023-07-14 DIAGNOSIS — R2689 Other abnormalities of gait and mobility: Secondary | ICD-10-CM | POA: Diagnosis not present

## 2023-07-14 DIAGNOSIS — E876 Hypokalemia: Secondary | ICD-10-CM | POA: Diagnosis present

## 2023-07-14 DIAGNOSIS — R918 Other nonspecific abnormal finding of lung field: Secondary | ICD-10-CM | POA: Diagnosis not present

## 2023-07-14 DIAGNOSIS — K8020 Calculus of gallbladder without cholecystitis without obstruction: Secondary | ICD-10-CM | POA: Diagnosis present

## 2023-07-14 DIAGNOSIS — J9601 Acute respiratory failure with hypoxia: Secondary | ICD-10-CM | POA: Diagnosis present

## 2023-07-14 DIAGNOSIS — Z6822 Body mass index (BMI) 22.0-22.9, adult: Secondary | ICD-10-CM

## 2023-07-14 DIAGNOSIS — I959 Hypotension, unspecified: Secondary | ICD-10-CM | POA: Diagnosis present

## 2023-07-14 DIAGNOSIS — Z7401 Bed confinement status: Secondary | ICD-10-CM | POA: Diagnosis not present

## 2023-07-14 DIAGNOSIS — R9431 Abnormal electrocardiogram [ECG] [EKG]: Secondary | ICD-10-CM | POA: Diagnosis present

## 2023-07-14 DIAGNOSIS — K219 Gastro-esophageal reflux disease without esophagitis: Secondary | ICD-10-CM | POA: Diagnosis present

## 2023-07-14 DIAGNOSIS — N179 Acute kidney failure, unspecified: Secondary | ICD-10-CM | POA: Diagnosis not present

## 2023-07-14 DIAGNOSIS — I471 Supraventricular tachycardia, unspecified: Secondary | ICD-10-CM | POA: Diagnosis not present

## 2023-07-14 DIAGNOSIS — I130 Hypertensive heart and chronic kidney disease with heart failure and stage 1 through stage 4 chronic kidney disease, or unspecified chronic kidney disease: Secondary | ICD-10-CM | POA: Diagnosis present

## 2023-07-14 DIAGNOSIS — F0393 Unspecified dementia, unspecified severity, with mood disturbance: Secondary | ICD-10-CM | POA: Diagnosis present

## 2023-07-14 DIAGNOSIS — E785 Hyperlipidemia, unspecified: Secondary | ICD-10-CM | POA: Diagnosis present

## 2023-07-14 DIAGNOSIS — I459 Conduction disorder, unspecified: Secondary | ICD-10-CM

## 2023-07-14 DIAGNOSIS — A0472 Enterocolitis due to Clostridium difficile, not specified as recurrent: Secondary | ICD-10-CM | POA: Diagnosis present

## 2023-07-14 DIAGNOSIS — Z7901 Long term (current) use of anticoagulants: Secondary | ICD-10-CM

## 2023-07-14 DIAGNOSIS — I4892 Unspecified atrial flutter: Secondary | ICD-10-CM

## 2023-07-14 DIAGNOSIS — G309 Alzheimer's disease, unspecified: Secondary | ICD-10-CM | POA: Diagnosis not present

## 2023-07-14 DIAGNOSIS — I455 Other specified heart block: Secondary | ICD-10-CM

## 2023-07-14 DIAGNOSIS — N189 Chronic kidney disease, unspecified: Secondary | ICD-10-CM | POA: Diagnosis present

## 2023-07-14 DIAGNOSIS — Z7189 Other specified counseling: Secondary | ICD-10-CM | POA: Diagnosis not present

## 2023-07-14 DIAGNOSIS — Z7982 Long term (current) use of aspirin: Secondary | ICD-10-CM

## 2023-07-14 DIAGNOSIS — Z79890 Hormone replacement therapy (postmenopausal): Secondary | ICD-10-CM

## 2023-07-14 DIAGNOSIS — I700 Atherosclerosis of aorta: Secondary | ICD-10-CM | POA: Diagnosis not present

## 2023-07-14 DIAGNOSIS — Z515 Encounter for palliative care: Secondary | ICD-10-CM

## 2023-07-14 DIAGNOSIS — I2510 Atherosclerotic heart disease of native coronary artery without angina pectoris: Secondary | ICD-10-CM | POA: Diagnosis not present

## 2023-07-14 DIAGNOSIS — R197 Diarrhea, unspecified: Secondary | ICD-10-CM | POA: Diagnosis not present

## 2023-07-14 DIAGNOSIS — M6281 Muscle weakness (generalized): Secondary | ICD-10-CM | POA: Diagnosis not present

## 2023-07-14 DIAGNOSIS — K828 Other specified diseases of gallbladder: Secondary | ICD-10-CM | POA: Diagnosis not present

## 2023-07-14 DIAGNOSIS — F0394 Unspecified dementia, unspecified severity, with anxiety: Secondary | ICD-10-CM | POA: Diagnosis present

## 2023-07-14 DIAGNOSIS — I2583 Coronary atherosclerosis due to lipid rich plaque: Secondary | ICD-10-CM | POA: Diagnosis not present

## 2023-07-14 DIAGNOSIS — Z7951 Long term (current) use of inhaled steroids: Secondary | ICD-10-CM

## 2023-07-14 DIAGNOSIS — Z885 Allergy status to narcotic agent status: Secondary | ICD-10-CM

## 2023-07-14 DIAGNOSIS — R5383 Other fatigue: Secondary | ICD-10-CM | POA: Diagnosis not present

## 2023-07-14 DIAGNOSIS — R109 Unspecified abdominal pain: Secondary | ICD-10-CM | POA: Diagnosis not present

## 2023-07-14 LAB — CBC WITH DIFFERENTIAL/PLATELET
Abs Immature Granulocytes: 0.51 10*3/uL — ABNORMAL HIGH (ref 0.00–0.07)
Basophils Absolute: 0.1 10*3/uL (ref 0.0–0.1)
Basophils Relative: 0 %
Eosinophils Absolute: 0.1 10*3/uL (ref 0.0–0.5)
Eosinophils Relative: 0 %
HCT: 36 % (ref 36.0–46.0)
Hemoglobin: 11.9 g/dL — ABNORMAL LOW (ref 12.0–15.0)
Immature Granulocytes: 1 %
Lymphocytes Relative: 4 %
Lymphs Abs: 1.5 10*3/uL (ref 0.7–4.0)
MCH: 27.2 pg (ref 26.0–34.0)
MCHC: 33.1 g/dL (ref 30.0–36.0)
MCV: 82.2 fL (ref 80.0–100.0)
Monocytes Absolute: 1.6 10*3/uL — ABNORMAL HIGH (ref 0.1–1.0)
Monocytes Relative: 5 %
Neutro Abs: 31.9 10*3/uL — ABNORMAL HIGH (ref 1.7–7.7)
Neutrophils Relative %: 90 %
Platelets: 254 10*3/uL (ref 150–400)
RBC: 4.38 MIL/uL (ref 3.87–5.11)
RDW: 15.9 % — ABNORMAL HIGH (ref 11.5–15.5)
Smear Review: NORMAL
WBC: 35.7 10*3/uL — ABNORMAL HIGH (ref 4.0–10.5)
nRBC: 0 % (ref 0.0–0.2)

## 2023-07-14 LAB — COMPREHENSIVE METABOLIC PANEL WITH GFR
ALT: 9 U/L (ref 0–44)
ALT: 9 U/L (ref 0–44)
AST: 17 U/L (ref 15–41)
AST: 19 U/L (ref 15–41)
Albumin: 1.9 g/dL — ABNORMAL LOW (ref 3.5–5.0)
Albumin: 1.7 g/dL — ABNORMAL LOW (ref 3.5–5.0)
Alkaline Phosphatase: 85 U/L (ref 38–126)
Alkaline Phosphatase: 75 U/L (ref 38–126)
Anion gap: 18 — ABNORMAL HIGH (ref 5–15)
Anion gap: 15 (ref 5–15)
BUN: 46 mg/dL — ABNORMAL HIGH (ref 8–23)
BUN: 41 mg/dL — ABNORMAL HIGH (ref 8–23)
CO2: 25 mmol/L (ref 22–32)
CO2: 23 mmol/L (ref 22–32)
Calcium: 7.3 mg/dL — ABNORMAL LOW (ref 8.9–10.3)
Calcium: 7 mg/dL — ABNORMAL LOW (ref 8.9–10.3)
Chloride: 92 mmol/L — ABNORMAL LOW (ref 98–111)
Chloride: 98 mmol/L (ref 98–111)
Creatinine, Ser: 3.6 mg/dL — ABNORMAL HIGH (ref 0.44–1.00)
Creatinine, Ser: 3.1 mg/dL — ABNORMAL HIGH (ref 0.44–1.00)
GFR, Estimated: 11 mL/min — ABNORMAL LOW (ref >60)
GFR, Estimated: 13 mL/min — ABNORMAL LOW (ref >60)
Glucose, Bld: 93 mg/dL (ref 70–99)
Glucose, Bld: 98 mg/dL (ref 70–99)
Potassium: <2 mmol/L — CL (ref 3.5–5.1)
Potassium: 2.8 mmol/L — ABNORMAL LOW (ref 3.5–5.1)
Sodium: 135 mmol/L (ref 135–145)
Sodium: 136 mmol/L (ref 135–145)
Total Bilirubin: 1.7 mg/dL — ABNORMAL HIGH (ref 0.0–1.2)
Total Bilirubin: 1 mg/dL (ref 0.0–1.2)
Total Protein: 5.4 g/dL — ABNORMAL LOW (ref 6.5–8.1)
Total Protein: 4.9 g/dL — ABNORMAL LOW (ref 6.5–8.1)

## 2023-07-14 LAB — PROTIME-INR
INR: 1.2 (ref 0.8–1.2)
Prothrombin Time: 15.2 s (ref 11.4–15.2)

## 2023-07-14 LAB — MAGNESIUM
Magnesium: 2.2 mg/dL (ref 1.7–2.4)
Magnesium: 2 mg/dL (ref 1.7–2.4)

## 2023-07-14 LAB — TSH: TSH: 0.233 u[IU]/mL — ABNORMAL LOW (ref 0.350–4.500)

## 2023-07-14 LAB — I-STAT CG4 LACTIC ACID, ED: Lactic Acid, Venous: 1 mmol/L (ref 0.5–1.9)

## 2023-07-14 MED ORDER — POTASSIUM CHLORIDE 20 MEQ PO PACK
40.0000 meq | PACK | Freq: Once | ORAL | Status: AC
  Administered 2023-07-14: 40 meq via ORAL
  Filled 2023-07-14: qty 2

## 2023-07-14 MED ORDER — POTASSIUM CHLORIDE 10 MEQ/100ML IV SOLN: 10.0000 meq | INTRAVENOUS | Status: DC

## 2023-07-14 MED ORDER — ACETAMINOPHEN 650 MG RE SUPP: 650.0000 mg | Freq: Four times a day (QID) | RECTAL | Status: DC | PRN

## 2023-07-14 MED ORDER — TRIMETHOBENZAMIDE HCL 100 MG/ML IM SOLN: 200.0000 mg | Freq: Four times a day (QID) | INTRAMUSCULAR | Status: DC | PRN

## 2023-07-14 MED ORDER — POTASSIUM CHLORIDE 10 MEQ/100ML IV SOLN
10.0000 meq | INTRAVENOUS | Status: AC
  Administered 2023-07-14: 10 meq via INTRAVENOUS

## 2023-07-14 MED ORDER — LEVOTHYROXINE SODIUM 100 MCG PO TABS
200.0000 ug | ORAL_TABLET | Freq: Every day | ORAL | Status: DC
  Administered 2023-07-15 – 2023-07-18 (×4): 200 ug via ORAL
  Filled 2023-07-14 (×4): qty 2

## 2023-07-14 MED ORDER — POTASSIUM CHLORIDE CRYS ER 20 MEQ PO TBCR: 40.0000 meq | EXTENDED_RELEASE_TABLET | ORAL | Status: DC

## 2023-07-14 MED ORDER — SODIUM CHLORIDE 0.9 % IV SOLN: 1.0000 g | Freq: Once | INTRAVENOUS | Status: DC

## 2023-07-14 MED ORDER — SACCHAROMYCES BOULARDII 250 MG PO CAPS
250.0000 mg | ORAL_CAPSULE | Freq: Two times a day (BID) | ORAL | Status: DC
  Administered 2023-07-15 – 2023-07-18 (×8): 250 mg via ORAL
  Filled 2023-07-14 (×9): qty 1

## 2023-07-14 MED ORDER — METRONIDAZOLE 500 MG/100ML IV SOLN
500.0000 mg | Freq: Two times a day (BID) | INTRAVENOUS | Status: DC
  Administered 2023-07-14: 500 mg via INTRAVENOUS
  Filled 2023-07-14: qty 100

## 2023-07-14 MED ORDER — METRONIDAZOLE 500 MG/100ML IV SOLN
500.0000 mg | Freq: Once | INTRAVENOUS | Status: AC
  Administered 2023-07-14: 500 mg via INTRAVENOUS
  Filled 2023-07-14: qty 100

## 2023-07-14 MED ORDER — VITAMIN B-12 1000 MCG PO TABS
1000.0000 ug | ORAL_TABLET | Freq: Every day | ORAL | Status: DC
  Administered 2023-07-14 – 2023-07-18 (×5): 1000 ug via ORAL
  Filled 2023-07-14 (×5): qty 1

## 2023-07-14 MED ORDER — ALBUTEROL SULFATE (2.5 MG/3ML) 0.083% IN NEBU: 2.5000 mg | INHALATION_SOLUTION | Freq: Four times a day (QID) | RESPIRATORY_TRACT | Status: DC | PRN

## 2023-07-14 MED ORDER — POTASSIUM CHLORIDE 10 MEQ/100ML IV SOLN
10.0000 meq | INTRAVENOUS | Status: AC
  Administered 2023-07-14 (×2): 10 meq via INTRAVENOUS
  Filled 2023-07-14 (×2): qty 100

## 2023-07-14 MED ORDER — LACTATED RINGERS IV BOLUS
1000.0000 mL | Freq: Once | INTRAVENOUS | Status: AC
  Administered 2023-07-14: 1000 mL via INTRAVENOUS

## 2023-07-14 MED ORDER — ACETAMINOPHEN 325 MG PO TABS
650.0000 mg | ORAL_TABLET | Freq: Four times a day (QID) | ORAL | Status: DC | PRN
  Filled 2023-07-14: qty 2

## 2023-07-14 MED ORDER — ASPIRIN 81 MG PO TBEC
81.0000 mg | DELAYED_RELEASE_TABLET | Freq: Every day | ORAL | Status: DC
  Administered 2023-07-14 – 2023-07-17 (×4): 81 mg via ORAL
  Filled 2023-07-14 (×4): qty 1

## 2023-07-14 MED ORDER — SODIUM CHLORIDE 0.9% FLUSH
3.0000 mL | Freq: Two times a day (BID) | INTRAVENOUS | Status: DC
  Administered 2023-07-14 – 2023-07-18 (×8): 3 mL via INTRAVENOUS

## 2023-07-14 MED ORDER — ADENOSINE 6 MG/2ML IV SOLN
INTRAVENOUS | Status: AC
  Filled 2023-07-14: qty 4

## 2023-07-14 MED ORDER — SODIUM CHLORIDE 0.9 % IV SOLN
2.0000 g | INTRAVENOUS | Status: DC
  Administered 2023-07-14: 2 g via INTRAVENOUS
  Filled 2023-07-14: qty 20

## 2023-07-14 MED ORDER — METOPROLOL TARTRATE 5 MG/5ML IV SOLN
5.0000 mg | Freq: Once | INTRAVENOUS | Status: AC
  Administered 2023-07-14: 5 mg via INTRAVENOUS
  Filled 2023-07-14: qty 5

## 2023-07-14 MED ORDER — LACTATED RINGERS IV SOLN: INTRAVENOUS | Status: AC

## 2023-07-14 MED ORDER — POTASSIUM CHLORIDE CRYS ER 20 MEQ PO TBCR: 40.0000 meq | EXTENDED_RELEASE_TABLET | Freq: Once | ORAL | Status: DC

## 2023-07-14 MED ORDER — HEPARIN SODIUM (PORCINE) 5000 UNIT/ML IJ SOLN
5000.0000 [IU] | Freq: Three times a day (TID) | INTRAMUSCULAR | Status: DC
  Administered 2023-07-14 (×2): 5000 [IU] via SUBCUTANEOUS
  Filled 2023-07-14 (×2): qty 1

## 2023-07-14 MED ORDER — POTASSIUM CHLORIDE 10 MEQ/100ML IV SOLN
INTRAVENOUS | Status: AC
  Administered 2023-07-14: 10 meq via INTRAVENOUS
  Filled 2023-07-14: qty 300

## 2023-07-14 MED ORDER — POTASSIUM CHLORIDE 10 MEQ/100ML IV SOLN
10.0000 meq | INTRAVENOUS | Status: AC
  Administered 2023-07-14: 10 meq via INTRAVENOUS
  Filled 2023-07-14: qty 100

## 2023-07-14 MED ORDER — FAMOTIDINE 20 MG PO TABS
20.0000 mg | ORAL_TABLET | Freq: Two times a day (BID) | ORAL | Status: DC
  Administered 2023-07-14 – 2023-07-18 (×8): 20 mg via ORAL
  Filled 2023-07-14 (×8): qty 1

## 2023-07-14 MED ORDER — DILTIAZEM HCL 25 MG/5ML IV SOLN
10.0000 mg | Freq: Once | INTRAVENOUS | Status: AC
  Administered 2023-07-14: 10 mg via INTRAVENOUS
  Filled 2023-07-14: qty 5

## 2023-07-14 MED ORDER — POTASSIUM CHLORIDE 10 MEQ/100ML IV SOLN
10.0000 meq | INTRAVENOUS | Status: AC
  Administered 2023-07-15: 10 meq via INTRAVENOUS
  Filled 2023-07-14: qty 100

## 2023-07-14 MED ORDER — FLUTICASONE FUROATE-VILANTEROL 200-25 MCG/ACT IN AEPB
1.0000 | INHALATION_SPRAY | Freq: Every day | RESPIRATORY_TRACT | Status: DC
  Administered 2023-07-15 – 2023-07-18 (×2): 1 via RESPIRATORY_TRACT
  Filled 2023-07-14 (×2): qty 28

## 2023-07-14 MED ORDER — LEVALBUTEROL HCL 0.63 MG/3ML IN NEBU: 0.6300 mg | INHALATION_SOLUTION | Freq: Four times a day (QID) | RESPIRATORY_TRACT | Status: DC | PRN

## 2023-07-14 MED ORDER — ROSUVASTATIN CALCIUM 20 MG PO TABS
20.0000 mg | ORAL_TABLET | Freq: Every day | ORAL | Status: DC
  Administered 2023-07-14 – 2023-07-18 (×5): 20 mg via ORAL
  Filled 2023-07-14 (×5): qty 1

## 2023-07-14 MED ORDER — SODIUM CHLORIDE 0.9 % IV SOLN
1.0000 g | Freq: Once | INTRAVENOUS | Status: DC
  Filled 2023-07-14: qty 10

## 2023-07-14 NOTE — H&P (Addendum)
 History and Physical    Patient: Wendy Wiley WUJ:[id number] DOB: 24-Jul-1922 DOA: 07/14/2023 DOS: the patient was seen and examined on 07/14/2023 PCP: Aldo Hun, MD  Patient coming from: Home via EMS  Chief Complaint:  Chief Complaint  Patient presents with   Hypotension   Loss of appetite   HPI: Wendy Wiley is a 89 y.o. female with medical history significant of hypertension, hypothyroidism, anxiety, depression, dementia, and GERD who presents with complaints of worsening diarrhea.  She is accompanied by her son who provides history.  She has been experiencing persistent diarrhea for several weeks, initially managed with Kaopectate and occasionally Imodium. Recently, the diarrhea had worsened, becoming more watery.  Also reported having intermittent abdominal cramps.  Denies any blood present in her stools or chest pain.Aaron Aas  Two weeks ago, she completed a course of antibiotics over five or six days for a mouth infection. Despite taking a probiotic pill along with antibiotics her diarrhea seem to worsen.  Son stopped the pill in a few days early due to her symptoms.  Her appetite decreased significantly 2 days ago, and she has since been consuming only ice. This change in diet coincided with a loss of strength in her legs, making it difficult for her to stand.  Normally she has been able to get around with use of a rolling walker and do most of her ADLs such as toileting and bathing without need of assistance.  She has been experiencing difficulty with her medication regimen. On Saturday, she took all her morning pills but did not take any at night. On Sunday, she attempted to take a pill, but it got stuck in her throat, so she did not take any more that day.  To his knowledge she has not had any significant fever, nausea, or vomiting.  Denies any prior history of C. difficile to his knowledge.  Due to her weakness EMS was called.  Patient was noted to have blood pressures as low as 86/48  and feeling given 500 mL bolus of normal saline IV fluids with some improvement in blood pressures.  O2 saturations noted to be as low as 88% on room air with improvement on 2 L of nasal cannula oxygen .  In the emergency department patient was noted to be afebrile with blood pressures noted to be as low as 91/45 with improvement with IV fluids to 105/48, respirations 16-34, and O2 saturation maintained on 2 L of nasal cannula oxygen .  Labs significant for WBC elevated at 35.7, hemoglobin 11.9, potassium less than 2, BUN 46, creatinine 3.6, calcium 7.3, albumin 1.9, total bilirubin 1.7, and lactic acid 1. Chest x-ray noted low lung volumes with mild left basilar atelectasis/scarring.  CT scan of the abdomen and pelvis noted distended gallbladder with layering gallstones and areas of wall thickening in the right colon, hepatic flexure and right sigmoid colon concerning for infectious/inflammatory colitis.  Right upper quadrant ultrasound had been ordered due to abnormality in gallbladder.  Orders have been placed for C. Difficile.  Patient has been given 2 L of lactated Ringer's, potassium chloride  40 meq IV, Rocephin 1 g IV, and metronidazole 500 mg IV.  Review of Systems: As mentioned in the history of present illness. All other systems reviewed and are negative. Past Medical History:  Diagnosis Date   Anginal pain (HCC)    Anxiety    Arthritis    Depression    GERD (gastroesophageal reflux disease)    Hypertension    Hypothyroidism  Past Surgical History:  Procedure Laterality Date   APPENDECTOMY     with ovarian cyst removal   COLONOSCOPY     CORONARY ARTERY BYPASS GRAFT     EYE SURGERY     B/L cataract   OVARIAN CYST REMOVAL     UPPER GI ENDOSCOPY     Social History:  reports that she has never smoked. She has never used smokeless tobacco. She reports that she does not drink alcohol and does not use drugs.  Allergies  Allergen Reactions   Ciprofloxacin Other (See Comments)     Reaction:  Unknown    Codeine Other (See Comments)    Reaction:  Altered mental status    Quinine Swelling    No family history on file.  Prior to Admission medications   Medication Sig Start Date End Date Taking? Authorizing Provider  albuterol  (PROVENTIL  HFA;VENTOLIN  HFA) 108 (90 Base) MCG/ACT inhaler Inhale 2 puffs into the lungs every 6 (six) hours as needed for wheezing or shortness of breath. 02/06/18   Ghimire, Estil Heman, MD  amLODipine  (NORVASC ) 5 MG tablet TAKE 1 TABLET(5 MG) BY MOUTH DAILY 01/04/20   Avanell Leigh, MD  aspirin  EC 81 MG tablet Take 81 mg by mouth at bedtime.     [provider]  budesonide-formoterol  (SYMBICORT) 160-4.5 MCG/ACT inhaler Inhale 2 puffs into the lungs as needed (wheezing).     [provider]  escitalopram  (LEXAPRO ) 5 MG tablet Take 5 mg by mouth at bedtime.    [provider]  ezetimibe  (ZETIA ) 10 MG tablet Take 10 mg by mouth at bedtime.    [provider]  fluticasone  (FLONASE ) 50 MCG/ACT nasal spray Place 2 sprays into both nostrils daily. 02/06/18   Ghimire, Estil Heman, MD  guaiFENesin  (MUCINEX ) 600 MG 12 hr tablet Take 1 tablet (600 mg total) by mouth 2 (two) times daily. 02/06/18   Ghimire, Estil Heman, MD  ipratropium (ATROVENT ) 0.02 % nebulizer solution Take 2.5 mLs (0.5 mg total) by nebulization every 6 (six) hours as needed for wheezing or shortness of breath. 02/06/18   Ghimire, Estil Heman, MD  levalbuterol  (XOPENEX ) 0.63 MG/3ML nebulizer solution Take 3 mLs (0.63 mg total) by nebulization every 6 (six) hours as needed for wheezing or shortness of breath. 02/06/18   Ghimire, Estil Heman, MD  levothyroxine  (SYNTHROID , LEVOTHROID) 200 MCG tablet Take 200 mcg by mouth daily before breakfast.     [provider]  loratadine  (CLARITIN ) 10 MG tablet Take 1 tablet (10 mg total) by mouth daily. 02/06/18   Ghimire, Estil Heman, MD  metoprolol  tartrate (LOPRESSOR ) 25 MG tablet Take 1 tablet (25 mg total) by mouth 2  (two) times daily. Must make an appt for future refills 08/20/18   Avanell Leigh, MD  Multiple Vitamins-Minerals (PRESERVISION AREDS 2) CAPS Take 1 capsule by mouth 2 (two) times daily.    [provider]  MYRBETRIQ  25 MG TB24 tablet Take 25 mg by mouth daily.    [provider]  potassium chloride  (K-DUR,KLOR-CON ) 10 MEQ tablet Take 10 mEq by mouth daily. 11/11/16   [provider]  predniSONE  (DELTASONE ) 10 MG tablet Take 4 tablets (40 mg) daily for 2 days, then, Take 3 tablets (30 mg) daily for 2 days, then, Take 2 tablets (20 mg) daily for 2 days, then, Take 1 tablets (10 mg) daily for 1 days, then stop 02/06/18   Burton Casey, MD    Physical Exam: Vitals:   07/14/23 1427 07/14/23  1430 07/14/23 1500 07/14/23 1515  BP: (!) 94/45 (!) 99/49 (!) 105/48   Pulse: 74 79 76 76  Resp: 16 (!) 22 (!) 24 (!) 34  Temp: 97.9 F (36.6 C)     TempSrc:      SpO2: 100% 100% 100% 100%  Weight:      Height:        Constitutional: Elderly female who appears chronically ill but able to follow commands. Eyes: PERRL, lids and conjunctivae normal ENMT: Mucous membranes are dry. Posterior pharynx clear of any exudate or lesions.  Neck: normal, supple    Respiratory: clear to auscultation bilaterally, no wheezing, no crackles. Normal respiratory effort. No accessory muscle use.  Cardiovascular: Regular rate and rhythm, positive systolic murmur.. No extremity edema. 2+ pedal pulses.   Abdomen: Generalized tenderness palpation without significant guarding appreciated..  Bowel sounds positive.  Musculoskeletal: no clubbing / cyanosis. No joint deformity upper and lower extremities. Good ROM, no contractures. Normal muscle tone.  Skin: no rashes, lesions, ulcers.   Neurologic: CN 2-12 grossly intact.   Patient able to move all extremities. Psychiatric: Normal judgment and insight. Alert and oriented x 3. Normal mood.   Data Reviewed:  EKG reveals normal sinus rhythm at 80  bpm with QTc 627.  Reviewed labs, imaging, and pertinent records as documented.  Assessment and Plan:  Diarrhea secondary to colitis Acute.  Patient presents with increasing diarrhea and abdominal cramping after taking antibiotic pills approximately 2 weeks ago.  CT scan of the abdomen pelvis significant for areas of wall thickening in the right colon, hepatic flexure and sigmoid colon concerning for infectious/inflammatory colitis.  Blood cultures were obtained and patient had been started on empiric antibiotics of Rocephin and metronidazole. - Admit to a telemetry bed - Strict I&O's - Check C. difficile and GI panel - Continue empiric antibiotics of Rocephin and metronidazole.  Plan to adjust antibiotics to oral vancomycin if found to have significant concern for C. difficile - Probiotics  Transient hypotension Acute.  On admission blood pressure noted to be as low as 91/45. - Hold home blood pressure regimen.  Continue to monitor him reassess when medically appropriate to resume - Goal MAP greater than 65  Leukocytosis Acute .  White blood cell count elevated up to 35.7.  Records note white blood cell counts had been previously elevated in the remote past.  Blood cultures have been obtained. - Follow-up blood cultures - Recheck CBC tomorrow morning  Acute respiratory failure with hypoxia O2 saturations noted to be as low as 88% on room air.  Chest x-ray without significant signs for infection, but did note low lung volumes with possible scarring/atelectasis. - Continuous pulse oximetry with oxygen  to maintain O2 saturations - Continue home inhaler and nebs as needed  Acute kidney injury superimposed on chronic kidney disease stage IIIa On admission creatinine noted to be elevated up to 3.6 with BUN 46.  Baseline creatinine previously noted to be around 1. - Check urinalysis with reflex to culture - Continue IV fluids - Recheck kidney function in a.m.  Prolonged QT interval Acute.   QTc prolonged at 627. - Avoid QT prolonging medications - Correct electrolyte abnormalities - Recheck EKG in a.m.  Hypokalemia Acute.  Initial potassium noted to be less than 2.  Patient had been ordered total 40 meq  of potassium chloride  IV. - Give additional potassium chloride  40 meq p.o. - Check magnesium level - Continue to monitor and replace as needed  Normocytic anemia Hemoglobin noted  to be 11.9.  No reports of blood present in stools. - Recheck CBC in a.m.  Diastolic dysfunction Patient without significant JVD or lower extremity swelling on physical exam.  Last echocardiogram noted EF to be 65 to 70% with grade 1 diastolic dysfunction,  aortic valve trileaflet with sclerosis without stenosis, trivial aortic regurgitation, and mild mitral valve regurgitation. - Continue to monitor fluid status with rehydration  CAD Patient with prior history of CABG. - Continue aspirin  and statin  Hypothyroidism - Check TSH - Continue levothyroxine   Hyperlipidemia - Continue Crestor  GERD - Continue Pepcid  Cholelithiasis Patient noted to have distended gallbladder on CT with gallstones present.  Total bilirubin which is mildly elevated at 1.7, but AST and ALT within normal limits. - Follow-up right upper quadrant ultrasound  Weakness Patient noted to have times a week that she was unable to ambulate.  Previously able to get around with use of a rolling walker. - PT to eval and treat and   DVT prophylaxis: Heparin Advance Care Planning:   Code Status: Do not attempt resuscitation (DNR) PRE-ARREST INTERVENTIONS DESIRED   Consults: None  Family Communication: Son updated at bedside  Severity of Illness: The appropriate patient status for this patient is INPATIENT. Inpatient status is judged to be reasonable and necessary in order to provide the required intensity of service to ensure the patient's safety. The patient's presenting symptoms, physical exam findings, and initial  radiographic and laboratory data in the context of their chronic comorbidities is felt to place them at high risk for further clinical deterioration. Furthermore, it is not anticipated that the patient will be medically stable for discharge from the hospital within 2 midnights of admission.   * I certify that at the point of admission it is my clinical judgment that the patient will require inpatient hospital care spanning beyond 2 midnights from the point of admission due to high intensity of service, high risk for further deterioration and high frequency of surveillance required.*  Author: Lena Qualia, MD 07/14/2023 3:33 PM  For on call review [email or web address].

## 2023-07-14 NOTE — ED Provider Notes (Addendum)
 [place] EMERGENCY DEPARTMENT AT [facility] Provider Note   CSN: [id number] Arrival date & time: 07/14/23  1057     Patient presents with: Hypotension and Loss of appetite   Wendy Wiley is a 89 y.o. female.   89 year old female with past medical history of hypertension and COPD presenting to the emergency department today with generalized weakness, decreased appetite, and diarrhea.  The patient does have issues with chronic diarrhea but this is been worse over the past few days.  The patient was treated with some oral antibiotics for a mouth infection last week and apparently things got worse after that.  The patient has been afebrile.  Has complained of some intermittent abdominal cramping.  She has not had any blood in her stool or dark stools per her son.  She has gone to the point that she has been having difficulty ambulating due to this.  She came to the ER today after her son brought her in due to generalized weakness.        Prior to Admission medications   Medication Sig Start Date End Date Taking? Authorizing Provider  albuterol  (PROVENTIL  HFA;VENTOLIN  HFA) 108 (90 Base) MCG/ACT inhaler Inhale 2 puffs into the lungs every 6 (six) hours as needed for wheezing or shortness of breath. 02/06/18   Ghimire, Estil Heman, MD  amLODipine  (NORVASC ) 5 MG tablet TAKE 1 TABLET(5 MG) BY MOUTH DAILY 01/04/20   Avanell Leigh, MD  aspirin  EC 81 MG tablet Take 81 mg by mouth at bedtime.     [provider]  budesonide-formoterol  (SYMBICORT) 160-4.5 MCG/ACT inhaler Inhale 2 puffs into the lungs as needed (wheezing).     [provider]  escitalopram  (LEXAPRO ) 5 MG tablet Take 5 mg by mouth at bedtime.    [provider]  ezetimibe  (ZETIA ) 10 MG tablet Take 10 mg by mouth at bedtime.    [provider]  fluticasone  (FLONASE ) 50 MCG/ACT nasal spray Place 2 sprays into both nostrils daily. 02/06/18   Ghimire, Estil Heman, MD  guaiFENesin  (MUCINEX ) 600  MG 12 hr tablet Take 1 tablet (600 mg total) by mouth 2 (two) times daily. 02/06/18   Ghimire, Estil Heman, MD  ipratropium (ATROVENT ) 0.02 % nebulizer solution Take 2.5 mLs (0.5 mg total) by nebulization every 6 (six) hours as needed for wheezing or shortness of breath. 02/06/18   Ghimire, Estil Heman, MD  levalbuterol  (XOPENEX ) 0.63 MG/3ML nebulizer solution Take 3 mLs (0.63 mg total) by nebulization every 6 (six) hours as needed for wheezing or shortness of breath. 02/06/18   Ghimire, Estil Heman, MD  levothyroxine  (SYNTHROID , LEVOTHROID) 200 MCG tablet Take 200 mcg by mouth daily before breakfast.     [provider]  loratadine  (CLARITIN ) 10 MG tablet Take 1 tablet (10 mg total) by mouth daily. 02/06/18   Ghimire, Estil Heman, MD  metoprolol  tartrate (LOPRESSOR ) 25 MG tablet Take 1 tablet (25 mg total) by mouth 2 (two) times daily. Must make an appt for future refills 08/20/18   Avanell Leigh, MD  Multiple Vitamins-Minerals (PRESERVISION AREDS 2) CAPS Take 1 capsule by mouth 2 (two) times daily.    [provider]  MYRBETRIQ  25 MG TB24 tablet Take 25 mg by mouth daily.    [provider]  potassium chloride  (K-DUR,KLOR-CON ) 10 MEQ tablet Take 10 mEq by mouth daily. 11/11/16   [provider]  predniSONE  (DELTASONE ) 10 MG tablet Take 4 tablets (40 mg) daily for 2 days, then, Take  3 tablets (30 mg) daily for 2 days, then, Take 2 tablets (20 mg) daily for 2 days, then, Take 1 tablets (10 mg) daily for 1 days, then stop 02/06/18   Burton Casey, MD    Allergies: Ciprofloxacin, Codeine, and Quinine    Review of Systems  Reason unable to perform ROS: Poor historian, majority of history obtained from the patient's son.  All other systems reviewed and are negative.   Updated Vital Signs BP (!) 105/48   Pulse 76   Temp 97.9 F (36.6 C)   Resp (!) 34   Ht 4' 10 (1.473 m) Comment: Family at bedside  Wt 63.5 kg Comment: Family at bedside  SpO2 100%   BMI 29.26  kg/m   Physical Exam Vitals and nursing note reviewed.   Gen: Chronically ill-appearing, no acute distress Eyes: PERRL, EOMI HEENT: no oropharyngeal swelling, dry mucous membranes Neck: trachea midline Resp: clear to auscultation bilaterally Card: RRR, no murmurs, rubs, or gallops Abd: Mild diffuse tenderness without guarding or rebound Extremities: no calf tenderness, no edema Vascular: 2+ radial pulses bilaterally, 2+ DP pulses bilaterally Skin: no rashes Psyc: acting appropriately   (all labs ordered are listed, but only abnormal results are displayed) Labs Reviewed  COMPREHENSIVE METABOLIC PANEL WITH GFR - Abnormal; Notable for the following components:      Result Value   Potassium <2.0 (*)    Chloride 92 (*)    BUN 46 (*)    Creatinine, Ser 3.60 (*)    Calcium 7.3 (*)    Total Protein 5.4 (*)    Albumin 1.9 (*)    Total Bilirubin 1.7 (*)    GFR, Estimated 11 (*)    Anion gap 18 (*)    All other components within normal limits  CBC WITH DIFFERENTIAL/PLATELET - Abnormal; Notable for the following components:   WBC 35.7 (*)    Hemoglobin 11.9 (*)    RDW 15.9 (*)    Neutro Abs 31.9 (*)    Monocytes Absolute 1.6 (*)    Abs Immature Granulocytes 0.51 (*)    All other components within normal limits  CULTURE, BLOOD (ROUTINE X 2)  CULTURE, BLOOD (ROUTINE X 2)  C DIFFICILE QUICK SCREEN W PCR REFLEX    PROTIME-INR  URINALYSIS, W/ REFLEX TO CULTURE (INFECTION SUSPECTED)  MAGNESIUM  I-STAT CG4 LACTIC ACID, ED    EKG: EKG Interpretation Date/Time:  Monday July 14 2023 11:36:26 EDT Ventricular Rate:  80 PR Interval:  139 QRS Duration:  106 QT Interval:  543 QTC Calculation: 627 R Axis:   46  Text Interpretation: Sinus rhythm Atrial premature complexes Nonspecific ST abnormality Prolonged QT interval Confirmed by Abner Hoffman [phone]) on 07/14/2023 12:07:07 PM  Radiology: CT ABDOMEN PELVIS WO CONTRAST Result Date: 07/14/2023 CLINICAL DATA:  Abdominal pain EXAM: CT  ABDOMEN AND PELVIS WITHOUT CONTRAST TECHNIQUE: Multidetector CT imaging of the abdomen and pelvis was performed following the standard protocol without IV contrast. RADIATION DOSE REDUCTION: This exam was performed according to the departmental dose-optimization program which includes automated exposure control, adjustment of the mA and/or kV according to patient size and/or use of iterative reconstruction technique. COMPARISON:  None Available. FINDINGS: Lower chest: Small right pleural effusion. Bibasilar atelectasis. Cardiomegaly, coronary artery disease, aortic atherosclerosis. Hepatobiliary: Gallbladder is distended. Small layering stones within the gallbladder. No biliary ductal dilatation. No focal hepatic abnormality. Pancreas: No focal abnormality or ductal dilatation.  Mild atrophy. Spleen: No focal abnormality.  Normal size. Adrenals/Urinary Tract: No adrenal abnormality.  No focal renal abnormality. No stones or hydronephrosis. Urinary bladder is unremarkable. Stomach/Bowel: Appears to be mild wall thickening within portions of the colon, best seen in the right colon and at the hepatic flexure as well as sigmoid colon. Findings concerning for colitis. No bowel obstruction. Stomach and small bowel decompressed, unremarkable. Vascular/Lymphatic: Aortic atherosclerosis. No evidence of aneurysm or adenopathy. Reproductive: Uterus and adnexa unremarkable.  No mass. Other: No free fluid or free air. Musculoskeletal: No acute bony abnormality. IMPRESSION: Distended gallbladder with layering gallstones. This could be further evaluated with right upper quadrant ultrasound if there is clinical concern for acute cholecystitis. Areas of wall thickening in the right colon, hepatic flexure, and sigmoid colon concerning for infectious/inflammatory colitis. Aortic atherosclerosis. Small right pleural effusion, bibasilar atelectasis. Electronically Signed   By: Janeece Mechanic M.D.   On: 07/14/2023 15:04   DG Chest Port 1  View Result Date: 07/14/2023 CLINICAL DATA:  Concern for sepsis. EXAM: PORTABLE CHEST 1 VIEW COMPARISON:  02/04/2018. FINDINGS: The heart size and mediastinal contours are within normal limits. Prior median sternotomy and CABG. Aortic atherosclerosis. Low lung volumes. Mild left basilar atelectasis/scarring. No focal consolidation, sizeable pleural effusion, or pneumothorax. No acute osseous abnormality. IMPRESSION: Low lung volumes with mild left basilar atelectasis/scarring. Otherwise, no acute cardiopulmonary findings. Electronically Signed   By: Mannie Seek M.D.   On: 07/14/2023 12:52     Procedures   Medications Ordered in the ED  potassium chloride  10 mEq in 100 mL IVPB (has no administration in time range)  metroNIDAZOLE (FLAGYL) IVPB 500 mg (has no administration in time range)  cefTRIAXone (ROCEPHIN) 1 g in sodium chloride  0.9 % 100 mL IVPB (has no administration in time range)  lactated ringers bolus 1,000 mL (has no administration in time range)  potassium chloride  10 mEq in 100 mL IVPB (has no administration in time range)  lactated ringers bolus 1,000 mL (1,000 mLs Intravenous New Bag/Given 07/14/23 1153)                                    Medical Decision Making 89 year old female with past medical history of hypertension and coronary artery disease presenting to the emergency department today with abdominal pain and hypotension.  The patient's blood pressures did improve with some fluids with medics.  I will further evaluate the patient here with a sepsis workup although this may be due to dehydration.  Will hold off on empiric antibiotics as she is reporting some worsening diarrhea after starting an oral antibiotics so C. difficile is certainly on the differential here.  I will obtain a CT scan of her abdomen in addition to a chest x-ray and urinalysis to eval for underlying infectious etiologies.  Will give her additional IV fluids here.  I will reevaluate for ultimate  disposition.  If she is able to give us  a stool sample will check her for C. difficile.  The patient did have a significant leukocytosis.  CT scan shows colitis and some gallstones.  The patient is covered with Rocephin and Flagyl to release to have some coverage for possible C. difficile.  Still suspect strongly that this is the cause of her symptoms.  The patient is also significantly hypokalemic.  Her QTc is prolonged on EKG.  The patient is ordered a total of 40 mEq of potassium to start.  She is given additional IV fluids.  Lactate is 1.  After the first  liter of fluids her blood pressures have improved to 105 systolic.  A call was placed to the hospitalist service for admission with right upper quadrant ultrasound pending.  CRITICAL CARE Performed by: Carin Charleston   Total critical care time: 36 minutes  Critical care time was exclusive of separately billable procedures and treating other patients.  Critical care was necessary to treat or prevent imminent or life-threatening deterioration.  Critical care was time spent personally by me on the following activities: development of treatment plan with patient and/or surrogate as well as nursing, discussions with consultants, evaluation of patient's response to treatment, examination of patient, obtaining history from patient or surrogate, ordering and performing treatments and interventions, ordering and review of laboratory studies, ordering and review of radiographic studies, pulse oximetry and re-evaluation of patient's condition.   Amount and/or Complexity of Data Reviewed Labs: ordered. Radiology: ordered.  Risk Prescription drug management. Decision regarding hospitalization.        Final diagnoses:  Colitis  Hypokalemia  AKI (acute kidney injury) [facility])    ED Discharge Orders     None          Carin Charleston, MD 07/14/23 1526    Carin Charleston, MD 07/14/23 1527

## 2023-07-14 NOTE — ED Notes (Signed)
 Son to nurses desk requests to speak with MD regarding code status as pt has dementia

## 2023-07-14 NOTE — ED Notes (Signed)
 Called lab regarding labs. Resent and told to run asap

## 2023-07-14 NOTE — ED Notes (Signed)
 Attending at bedside. EDP also at bedside to discuss POC. Pt continues to have unknown arrhythmia. Pt' s BP is soft so fluid bolus started as well. New IV obtained for probable adenosine push. Pt is alert but color is pale.

## 2023-07-14 NOTE — Significant Event (Signed)
 Patient's son Mr.Wendy Wiley requested code status tot be changed to full code.  Wendy Wiley.

## 2023-07-14 NOTE — ED Notes (Signed)
 Pt HR increased to 144.  Pt asymptomatic. MD aware.

## 2023-07-14 NOTE — ED Notes (Signed)
 Pt in SVT- EDP notified. EKG shot. Pt coughed and went back into NSR after about 10 mins.

## 2023-07-14 NOTE — ED Triage Notes (Addendum)
 Pt to ED from home via EMS c/o failure to thrive, diarrhea x multiple weeks, unable to ambulate x 2 days, anorexia x 2 days, & hypotension. Dementia @ baseline -- A & O x 2. Pt verbalizes no complaints.   86/48 in the field, gave NSS, last BP 100/50. HR 68, RR 20. 88%RA-on 2L now 96%. BG 99.   20g R hand

## 2023-07-14 NOTE — ED Notes (Signed)
 Received pt from [employer]

## 2023-07-14 NOTE — ED Notes (Signed)
 Pt's attending paged notified about her rhythm. EKGs shot

## 2023-07-15 ENCOUNTER — Telehealth (HOSPITAL_COMMUNITY): Payer: Self-pay | Admitting: Pharmacy Technician

## 2023-07-15 ENCOUNTER — Other Ambulatory Visit (HOSPITAL_COMMUNITY): Payer: Self-pay

## 2023-07-15 DIAGNOSIS — Z951 Presence of aortocoronary bypass graft: Secondary | ICD-10-CM

## 2023-07-15 DIAGNOSIS — I4892 Unspecified atrial flutter: Secondary | ICD-10-CM | POA: Diagnosis not present

## 2023-07-15 DIAGNOSIS — Z515 Encounter for palliative care: Secondary | ICD-10-CM | POA: Diagnosis not present

## 2023-07-15 DIAGNOSIS — A0472 Enterocolitis due to Clostridium difficile, not specified as recurrent: Secondary | ICD-10-CM

## 2023-07-15 DIAGNOSIS — N1831 Chronic kidney disease, stage 3a: Secondary | ICD-10-CM

## 2023-07-15 DIAGNOSIS — I2510 Atherosclerotic heart disease of native coronary artery without angina pectoris: Secondary | ICD-10-CM

## 2023-07-15 DIAGNOSIS — I959 Hypotension, unspecified: Secondary | ICD-10-CM | POA: Diagnosis not present

## 2023-07-15 DIAGNOSIS — N179 Acute kidney failure, unspecified: Secondary | ICD-10-CM | POA: Diagnosis not present

## 2023-07-15 DIAGNOSIS — Z7189 Other specified counseling: Secondary | ICD-10-CM | POA: Diagnosis not present

## 2023-07-15 LAB — COMPREHENSIVE METABOLIC PANEL WITH GFR
ALT: 10 U/L (ref 0–44)
AST: 21 U/L (ref 15–41)
Albumin: 1.6 g/dL — ABNORMAL LOW (ref 3.5–5.0)
Alkaline Phosphatase: 78 U/L (ref 38–126)
Anion gap: 14 (ref 5–15)
BUN: 36 mg/dL — ABNORMAL HIGH (ref 8–23)
CO2: 21 mmol/L — ABNORMAL LOW (ref 22–32)
Calcium: 6.9 mg/dL — ABNORMAL LOW (ref 8.9–10.3)
Chloride: 101 mmol/L (ref 98–111)
Creatinine, Ser: 2.71 mg/dL — ABNORMAL HIGH (ref 0.44–1.00)
GFR, Estimated: 15 mL/min — ABNORMAL LOW (ref >60)
Glucose, Bld: 114 mg/dL — ABNORMAL HIGH (ref 70–99)
Potassium: 3.1 mmol/L — ABNORMAL LOW (ref 3.5–5.1)
Sodium: 136 mmol/L (ref 135–145)
Total Bilirubin: 0.6 mg/dL (ref 0.0–1.2)
Total Protein: 4.8 g/dL — ABNORMAL LOW (ref 6.5–8.1)

## 2023-07-15 LAB — CBC
HCT: 34.9 % — ABNORMAL LOW (ref 36.0–46.0)
Hemoglobin: 11.4 g/dL — ABNORMAL LOW (ref 12.0–15.0)
MCH: 27.1 pg (ref 26.0–34.0)
MCHC: 32.7 g/dL (ref 30.0–36.0)
MCV: 82.9 fL (ref 80.0–100.0)
Platelets: 246 10*3/uL (ref 150–400)
RBC: 4.21 MIL/uL (ref 3.87–5.11)
RDW: 16.2 % — ABNORMAL HIGH (ref 11.5–15.5)
WBC: 27.7 10*3/uL — ABNORMAL HIGH (ref 4.0–10.5)
nRBC: 0 % (ref 0.0–0.2)

## 2023-07-15 LAB — GASTROINTESTINAL PANEL BY PCR, STOOL (REPLACES STOOL CULTURE)

## 2023-07-15 LAB — C DIFFICILE QUICK SCREEN W PCR REFLEX
C Diff antigen: POSITIVE — AB
C Diff interpretation: DETECTED
C Diff toxin: POSITIVE — AB

## 2023-07-15 LAB — HEPARIN LEVEL (UNFRACTIONATED)
Heparin Unfractionated: 0.52 [IU]/mL (ref 0.30–0.70)
Heparin Unfractionated: 0.62 [IU]/mL (ref 0.30–0.70)

## 2023-07-15 LAB — MRSA NEXT GEN BY PCR, NASAL: MRSA by PCR Next Gen: NOT DETECTED

## 2023-07-15 LAB — TROPONIN I (HIGH SENSITIVITY)
Troponin I (High Sensitivity): 155 ng/L (ref <18)
Troponin I (High Sensitivity): 138 ng/L (ref <18)

## 2023-07-15 LAB — T4, FREE: Free T4: 1.85 ng/dL — ABNORMAL HIGH (ref 0.61–1.12)

## 2023-07-15 MED ORDER — AMIODARONE HCL IN DEXTROSE 360-4.14 MG/200ML-% IV SOLN
60.0000 mg/h | INTRAVENOUS | Status: DC
  Administered 2023-07-15: 60 mg/h via INTRAVENOUS
  Filled 2023-07-15 (×2): qty 200

## 2023-07-15 MED ORDER — AMIODARONE LOAD VIA INFUSION
150.0000 mg | Freq: Once | INTRAVENOUS | Status: AC
  Administered 2023-07-15: 150 mg via INTRAVENOUS
  Filled 2023-07-15: qty 83.34

## 2023-07-15 MED ORDER — DIPHENHYDRAMINE HCL 25 MG PO CAPS
25.0000 mg | ORAL_CAPSULE | Freq: Once | ORAL | Status: AC
  Administered 2023-07-15: 25 mg via ORAL
  Filled 2023-07-15: qty 1

## 2023-07-15 MED ORDER — LACTATED RINGERS IV BOLUS
1000.0000 mL | Freq: Once | INTRAVENOUS | Status: AC
  Administered 2023-07-15: 1000 mL via INTRAVENOUS

## 2023-07-15 MED ORDER — POTASSIUM CHLORIDE 20 MEQ PO PACK
40.0000 meq | PACK | Freq: Once | ORAL | Status: AC
  Administered 2023-07-15: 40 meq via ORAL

## 2023-07-15 MED ORDER — POTASSIUM CHLORIDE 20 MEQ PO PACK
40.0000 meq | PACK | Freq: Every day | ORAL | Status: DC
  Filled 2023-07-15: qty 2

## 2023-07-15 MED ORDER — VANCOMYCIN HCL 125 MG PO CAPS
125.0000 mg | ORAL_CAPSULE | Freq: Three times a day (TID) | ORAL | Status: DC
  Administered 2023-07-15 (×4): 125 mg via ORAL
  Filled 2023-07-15 (×8): qty 1

## 2023-07-15 MED ORDER — AMIODARONE HCL IN DEXTROSE 360-4.14 MG/200ML-% IV SOLN
30.0000 mg/h | INTRAVENOUS | Status: DC
  Administered 2023-07-15 (×2): 30 mg/h via INTRAVENOUS
  Filled 2023-07-15 (×4): qty 200

## 2023-07-15 MED ORDER — HEPARIN (PORCINE) 25000 UT/250ML-% IV SOLN
700.0000 [IU]/h | INTRAVENOUS | Status: DC
  Administered 2023-07-15 – 2023-07-16 (×2): 750 [IU]/h via INTRAVENOUS
  Administered 2023-07-18: 700 [IU]/h via INTRAVENOUS
  Filled 2023-07-15 (×3): qty 250

## 2023-07-15 MED ORDER — METOPROLOL TARTRATE 12.5 MG HALF TABLET
12.5000 mg | ORAL_TABLET | Freq: Three times a day (TID) | ORAL | Status: DC
  Administered 2023-07-15 – 2023-07-16 (×3): 12.5 mg via ORAL
  Filled 2023-07-15 (×3): qty 1

## 2023-07-15 MED ORDER — POTASSIUM CHLORIDE 20 MEQ PO PACK: 40.0000 meq | PACK | Freq: Every day | ORAL | Status: DC

## 2023-07-15 MED ORDER — POTASSIUM CHLORIDE CRYS ER 20 MEQ PO TBCR
40.0000 meq | EXTENDED_RELEASE_TABLET | Freq: Once | ORAL | Status: AC
  Administered 2023-07-15: 40 meq via ORAL
  Filled 2023-07-15: qty 2

## 2023-07-15 NOTE — Plan of Care (Signed)
  Problem: Clinical Measurements: Goal: Respiratory complications will improve Outcome: Progressing   Problem: Pain Managment: Goal: General experience of comfort will improve and/or be controlled Outcome: Progressing

## 2023-07-15 NOTE — ED Notes (Signed)
 Pt's had loose runny stool- changed and cleaned.

## 2023-07-15 NOTE — Progress Notes (Signed)
 Progress Note   Patient: Wendy Wiley ZOX:[id number] DOB: 05-28-1922 DOA: 07/14/2023  DOS: the patient was seen and examined on 07/15/2023   Brief hospital course:  89 y.o. female with medical history significant of hypertension, hypothyroidism, anxiety, depression, dementia, and GERD who presents with complaints of worsening diarrhea.  Found to have c.diff colitis.  Subsequent afib RVR  Assessment and Plan:  Acute C. difficile colitis - Profuse watery diarrhea after antibiotic use.  The etiology of patient's marked leukocytosis.  C. difficile positive with positive toxin PCR.  Placed on p.o. vancomycin 4 times daily.  Aggressive IV fluid hydration.  Contact precautions.  Atrial flutter with RVR -No known history of a flutter/fib.  Occurred morning 6/16, rates in the 130s with systolic blood pressure in the 90s.  Patient dyspneic with chest pain.  IV fluid bolus given, attempted 6 mg IV adenosine, metoprolol  IV with minimal effect.  Subsequently discussed with cardiology to start amiodarone infusion and heparin drip.  Blood pressure appears more stable, however rate still uncontrolled, 120s. Will continue to monitor on telemetry.  Monitor blood pressure closely.  Acute kidney injury on CKD 3 AA - Creatinine markedly elevated on presentation, 3.6.  Showing improvement after IV fluid hydration.  Likely prerenal etiology given patient's diffuse diarrhea.  Continue aggressive IV fluid hydration.  Monitor urine output and BMP and magnesium in a.m.  Elevated troponin - Likely demand ischemia given SVT/A-fib RVR.  Mild hypokalemia - Replenishment on board.  Will recheck BMP and magnesium in AM.  Goals of care - 89 year old female with multiple comorbidities, presenting with worsening renal function, C. difficile, SVT and hypotension.  Patient is DNR.  Palliative care following.  Do not want advanced measures such as ICU level intubation, vasopressor support however are okay with ongoing  treatment regimen.  Prepared to pursue comfort care patient decompensates.  CAD - Aspirin , statin on board.  Hypothyroidism - TSH mildly low with mildly elevated T4.  May be contributing to patient's A-flutter however will more likely dehydration.  Continue Synthroid  for now.  Patient would need to reevaluate TSH and Synthroid  dosing in the outpatient setting.  Subjective: Patient resting comfortably this morning, appears pleasantly demented otherwise in no acute distress.  Blood pressure stable.  Overnight had episode of a flutter with hypotension, status post adenosine, metoprolol , now on amiodarone drip.  Currently denies any chest pain, fever, nausea, vomiting, abdominal pain.  Physical Exam:  Vitals:   07/15/23 0645 07/15/23 0945 07/15/23 1000 07/15/23 1015  BP: (!) 118/53 116/63 103/61 (!) 114/58  Pulse: (!) 118   (!) 121  Resp: (!) 24 (!) 22 18 (!) 23  Temp:      TempSrc:      SpO2: 94%   98%  Weight:      Height:        GENERAL:  Alert, pleasant, no acute distress, frail HEENT:  EOMI CARDIOVASCULAR: Irregularly irregular and tachycardic RESPIRATORY:  Clear to auscultation, no wheezing, rales, or rhonchi GASTROINTESTINAL:  Soft, nontender, nondistended EXTREMITIES:  No LE edema bilaterally NEURO:  No new focal deficits appreciated SKIN:  No rashes noted PSYCH:  Appropriate mood and affect     Data Reviewed:  ECG personally reviewed noting a flutter with 2: 1 and 3: 1 block, tachycardic.  Previous records (including but not limited to H&P, progress notes, nursing notes, TOC management) were reviewed in assessment of this patient.  Labs: CBC: Recent Labs  Lab 07/14/23 1139 07/15/23 0448  WBC 35.7* 27.7*  NEUTROABS  31.9*  --   HGB 11.9* 11.4*  HCT 36.0 34.9*  MCV 82.2 82.9  PLT 254 246   Basic Metabolic Panel: Recent Labs  Lab 07/14/23 1139 07/14/23 1432 07/14/23 2221 07/15/23 0448  NA 135  --  136 136  K <2.0*  --  2.8* 3.1*  CL 92*  --  98 101   CO2 25  --  23 21*  GLUCOSE 93  --  98 114*  BUN 46*  --  41* 36*  CREATININE 3.60*  --  3.10* 2.71*  CALCIUM 7.3*  --  7.0* 6.9*  MG  --  2.2 2.0  --    Liver Function Tests: Recent Labs  Lab 07/14/23 1139 07/14/23 2221 07/15/23 0448  AST 17 19 21   ALT 9 9 10   ALKPHOS 85 75 78  BILITOT 1.7* 1.0 0.6  PROT 5.4* 4.9* 4.8*  ALBUMIN 1.9* 1.7* 1.6*   CBG: No results for input(s): GLUCAP in the last 168 hours.  Scheduled Meds:  aspirin  EC  81 mg Oral QHS   cyanocobalamin   1,000 mcg Oral Daily   famotidine  20 mg Oral BID   fluticasone  furoate-vilanterol  1 puff Inhalation Daily   levothyroxine   200 mcg Oral QAC breakfast   rosuvastatin  20 mg Oral Daily   saccharomyces boulardii  250 mg Oral BID   sodium chloride  flush  3 mL Intravenous Q12H   vancomycin  125 mg Oral TID AC & HS   Continuous Infusions:  amiodarone 30 mg/hr (07/15/23 0805)   heparin 750 Units/hr (07/15/23 0218)   lactated ringers 100 mL/hr at 07/15/23 0805   PRN Meds:.acetaminophen  **OR** acetaminophen , levalbuterol , trimethobenzamide  Family Communication: None at bedside at this time  Disposition: Status is: Inpatient Remains inpatient appropriate because: C. difficile, a flutter RVR     Time spent: 50 minutes  Length of inpatient stay: 1 days  Author: Jodeane Mulligan, DO 07/15/2023 12:01 PM  For on call review [email or web address].

## 2023-07-15 NOTE — Progress Notes (Signed)
 PT Cancellation Note  Patient Details Name: Wendy Wiley MRN: [id number] DOB: November 26, 1922   Cancelled Treatment:    Reason Eval/Treat Not Completed: Medical issues which prohibited therapy (Pt's HR remains unstable with runs of SVT and soft BP. Pt now + for c. diff, 1x cardioversion performed in ED. Pt on amio drip. Will hold off at this time and follow up as medically stable and schedule allows.)   Corbin Dess PT, DPT Acute Rehabilitation Services Office [phone]  07/15/23 7:59 AM

## 2023-07-15 NOTE — Telephone Encounter (Addendum)
 Patient Product/process development scientist completed.    The patient is insured through [employer]. Patient has Medicare and is not eligible for a copay card, but may be able to apply for patient assistance or Medicare RX Payment Plan (Patient Must reach out to their plan, if eligible for payment plan), if available.    Ran test claim for Eliquis 5 mg and the current 30 day co-pay is $40.00.  Ran test claim for vancomycin 125 mg and the current 10 day co-pay is $10.00.  This test claim was processed through [facility]- copay amounts may vary at other pharmacies due to pharmacy/plan contracts, or as the patient moves through the different stages of their insurance plan.     Morgan Arab, CPHT Pharmacy Technician III Certified Patient Advocate [facility] Patient Advocate Team Direct Number: [phone]  Fax: [phone]

## 2023-07-15 NOTE — ED Notes (Signed)
 Pt's HR has dropped to 110s from 130s. Pt denies chest pain or SOB. EKG repeated and shown to EDP for interpretation. Attending informed

## 2023-07-15 NOTE — ED Notes (Signed)
 Pt had loose BM- changed and cleaned

## 2023-07-15 NOTE — Progress Notes (Signed)
 PHARMACY - ANTICOAGULATION CONSULT NOTE  Pharmacy Consult for heparin Indication: atrial fibrillation  Allergies  Allergen Reactions   Codeine Other (See Comments)    Reaction:  Altered mental status    Ciprofloxacin Other (See Comments)    Reaction:  Unknown    Penicillins Other (See Comments)    Reaction type/severity unknown   Quinine Swelling    Patient Measurements: Height: 4' 10 (147.3 cm) (Family at bedside) Weight: 63.5 kg (140 lb) (Family at bedside) IBW/kg (Calculated) : 40.9 HEPARIN DW (KG): 54.8  Vital Signs: Temp: 97.8 F (36.6 C) (06/16 1838) Temp Source: Oral (06/16 1838) BP: 101/58 (06/17 0015) Pulse Rate: 142 (06/17 0015)  Labs: Recent Labs    07/14/23 1139 07/14/23 2221  HGB 11.9*  --   HCT 36.0  --   PLT 254  --   LABPROT 15.2  --   INR 1.2  --   CREATININE 3.60* 3.10*    Estimated Creatinine Clearance: 8 mL/min (A) (by C-G formula based on SCr of 3.1 mg/dL (H)).   Medical History: Past Medical History:  Diagnosis Date   Anginal pain (HCC)    Anxiety    Arthritis    Depression    GERD (gastroesophageal reflux disease)    Hypertension    Hypothyroidism     Medications:  No current facility-administered medications on file prior to encounter.   Current Outpatient Medications on File Prior to Encounter  Medication Sig Dispense Refill   amLODipine  (NORVASC ) 2.5 MG tablet Take 2.5 mg by mouth daily.     aspirin  EC 81 MG tablet Take 81 mg by mouth at bedtime.      Bismuth Subsalicylate (KAOPECTATE) 262 MG TABS Take 2 tablets by mouth 4 (four) times daily -  with meals and at bedtime.     budesonide-formoterol  (SYMBICORT) 160-4.5 MCG/ACT inhaler Inhale 2 puffs into the lungs as needed (wheezing).      Cholecalciferol 1.25 MG (50000 UT) TABS Take 1 tablet by mouth daily.     cyanocobalamin  1000 MCG tablet Take 1,000 mcg by mouth daily.     escitalopram  (LEXAPRO ) 5 MG tablet Take 5 mg by mouth at bedtime.     famotidine (PEPCID) 20 MG  tablet Take 20 mg by mouth 2 (two) times daily.     furosemide  (LASIX ) 20 MG tablet Take 20 mg by mouth 2 (two) times a week.     ipratropium (ATROVENT ) 0.02 % nebulizer solution Take 2.5 mLs (0.5 mg total) by nebulization every 6 (six) hours as needed for wheezing or shortness of breath. 75 mL 0   levalbuterol  (XOPENEX ) 0.63 MG/3ML nebulizer solution Take 3 mLs (0.63 mg total) by nebulization every 6 (six) hours as needed for wheezing or shortness of breath. 60 mL 12   levothyroxine  (SYNTHROID , LEVOTHROID) 200 MCG tablet Take 200 mcg by mouth daily before breakfast.   8   metoprolol  tartrate (LOPRESSOR ) 25 MG tablet Take 1 tablet (25 mg total) by mouth 2 (two) times daily. Must make an appt for future refills 30 tablet 1   Multiple Vitamins-Minerals (PRESERVISION AREDS 2) CAPS Take 1 capsule by mouth 2 (two) times daily.     MYRBETRIQ  25 MG TB24 tablet Take 25 mg by mouth daily.  11   olopatadine (PATADAY) 0.1 % ophthalmic solution Place 1 drop into both eyes 2 (two) times daily.     potassium chloride  (K-DUR,KLOR-CON ) 10 MEQ tablet Take 10 mEq by mouth 2 (two) times a week.  1   rosuvastatin (  CRESTOR) 20 MG tablet Take 20 mg by mouth daily.       Assessment: 89 y.o. female with new onset Afib for heparin Goal of Therapy:  Heparin level 0.3-0.7 units/ml Monitor platelets by anticoagulation protocol: Yes   Plan:  Start heparin 750 units/hr Check heparin level in 8 hours.   Carlota Chestnut 07/15/2023,12:38 AM

## 2023-07-15 NOTE — Hospital Course (Signed)
 89 y.o. female with medical history significant of hypertension, hypothyroidism, anxiety, depression, dementia, and GERD who presents with complaints of worsening diarrhea.  Found to have c.diff colitis.  Subsequent afib RVR   Assessment and Plan:   Acute C. difficile colitis - Profuse watery diarrhea after antibiotic use.  The etiology of patient's marked leukocytosis.  C. difficile positive with positive toxin PCR.  Placed on p.o. vancomycin 4 times daily.  Aggressive IV fluid hydration.  Contact precautions.   Atrial flutter with RVR -No known history of a flutter/fib.  Occurred morning 6/16, rates in the 130s with systolic blood pressure in the 90s.  Patient dyspneic with chest pain.  IV fluid bolus given, attempted 6 mg IV adenosine, metoprolol  IV with minimal effect.  Subsequently discussed with cardiology to start amiodarone infusion and heparin drip.  Blood pressure appears more stable, however rate still uncontrolled, 120s. Will continue to monitor on telemetry.  Monitor blood pressure closely.   Acute kidney injury on CKD 3 AA - Creatinine markedly elevated on presentation, 3.6.  Showing improvement after IV fluid hydration.  Likely prerenal etiology given patient's diffuse diarrhea.  Continue aggressive IV fluid hydration.  Monitor urine output and BMP and magnesium in a.m.   Elevated troponin - Likely demand ischemia given SVT/A-fib RVR.   Mild hypokalemia - Replenishment on board.  Will recheck BMP and magnesium in AM.   Goals of care - 89 year old female with multiple comorbidities, presenting with worsening renal function, C. difficile, SVT and hypotension.  Patient is DNR.  Palliative care following.  Do not want advanced measures such as ICU level intubation, vasopressor support however are okay with ongoing treatment regimen.  Prepared to pursue comfort care patient decompensates.   CAD - Aspirin , statin on board.   Hypothyroidism - TSH mildly low with mildly elevated T4.   May be contributing to patient's A-flutter however will more likely dehydration.  Continue Synthroid  for now.  Patient would need to reevaluate TSH and Synthroid  dosing in the outpatient setting.

## 2023-07-15 NOTE — Progress Notes (Signed)
 PHARMACY - ANTICOAGULATION CONSULT NOTE  Pharmacy Consult for heparin Indication: atrial fibrillation  Allergies  Allergen Reactions   Codeine Other (See Comments)    Reaction:  Altered mental status    Ciprofloxacin Other (See Comments)    Reaction:  Unknown    Penicillins Other (See Comments)    Reaction type/severity unknown   Quinine Swelling    Patient Measurements: Height: 4' 10 (147.3 cm) (Family at bedside) Weight: 63.5 kg (140 lb) (Family at bedside) IBW/kg (Calculated) : 40.9 HEPARIN DW (KG): 54.8  Vital Signs: Temp: 98.9 F (37.2 C) (06/17 0522) Temp Source: Oral (06/17 0522) BP: 118/53 (06/17 0645) Pulse Rate: 118 (06/17 0645)  Labs: Recent Labs    07/14/23 1139 07/14/23 2221 07/15/23 0448 07/15/23 0830  HGB 11.9*  --  11.4*  --   HCT 36.0  --  34.9*  --   PLT 254  --  246  --   LABPROT 15.2  --   --   --   INR 1.2  --   --   --   HEPARINUNFRC  --   --   --  0.52  CREATININE 3.60* 3.10* 2.71*  --     Estimated Creatinine Clearance: 9.1 mL/min (A) (by C-G formula based on SCr of 2.71 mg/dL (H)).   Medical History: Past Medical History:  Diagnosis Date   Anginal pain (HCC)    Anxiety    Arthritis    Depression    GERD (gastroesophageal reflux disease)    Hypertension    Hypothyroidism     Medications:  No current facility-administered medications on file prior to encounter.   Current Outpatient Medications on File Prior to Encounter  Medication Sig Dispense Refill   amLODipine  (NORVASC ) 2.5 MG tablet Take 2.5 mg by mouth daily.     aspirin  EC 81 MG tablet Take 81 mg by mouth at bedtime.      Bismuth Subsalicylate (KAOPECTATE) 262 MG TABS Take 2 tablets by mouth 4 (four) times daily -  with meals and at bedtime.     budesonide-formoterol  (SYMBICORT) 160-4.5 MCG/ACT inhaler Inhale 2 puffs into the lungs as needed (wheezing).      Cholecalciferol 1.25 MG (50000 UT) TABS Take 1 tablet by mouth daily.     cyanocobalamin  1000 MCG tablet Take  1,000 mcg by mouth daily.     escitalopram  (LEXAPRO ) 5 MG tablet Take 5 mg by mouth at bedtime.     famotidine (PEPCID) 20 MG tablet Take 20 mg by mouth 2 (two) times daily.     furosemide  (LASIX ) 20 MG tablet Take 20 mg by mouth 2 (two) times a week.     ipratropium (ATROVENT ) 0.02 % nebulizer solution Take 2.5 mLs (0.5 mg total) by nebulization every 6 (six) hours as needed for wheezing or shortness of breath. 75 mL 0   levalbuterol  (XOPENEX ) 0.63 MG/3ML nebulizer solution Take 3 mLs (0.63 mg total) by nebulization every 6 (six) hours as needed for wheezing or shortness of breath. 60 mL 12   levothyroxine  (SYNTHROID , LEVOTHROID) 200 MCG tablet Take 200 mcg by mouth daily before breakfast.   8   metoprolol  tartrate (LOPRESSOR ) 25 MG tablet Take 1 tablet (25 mg total) by mouth 2 (two) times daily. Must make an appt for future refills 30 tablet 1   Multiple Vitamins-Minerals (PRESERVISION AREDS 2) CAPS Take 1 capsule by mouth 2 (two) times daily.     MYRBETRIQ  25 MG TB24 tablet Take 25 mg by mouth daily.  11   olopatadine (PATADAY) 0.1 % ophthalmic solution Place 1 drop into both eyes 2 (two) times daily.     potassium chloride  (K-DUR,KLOR-CON ) 10 MEQ tablet Take 10 mEq by mouth 2 (two) times a week.  1   rosuvastatin (CRESTOR) 20 MG tablet Take 20 mg by mouth daily.       Assessment: 89 y.o. female with new onset atrial fibrillation. Not on anticoagulation PTA. Pharmacy consulted to dose heparin in setting of afib.   Heparin level therapeutic at 0.52 on 750 units/hr. CBC stable. No signs of bleeding noted.  Goal of Therapy:  Heparin level 0.3-0.7 units/ml Monitor platelets by anticoagulation protocol: Yes   Plan:  Continue heparin at 750 units/hr  Confirmatory heparin level in 8 hours  Daily heparin level and CBC  Monitor for s/sx of bleeding F/u long term AC plans  Saleena Tamas I Lerin Jech 07/15/2023,9:31 AM

## 2023-07-15 NOTE — Consult Note (Cosign Needed)
 Consultation Note Date: 07/15/2023   Patient Name: Wendy Wiley  DOB: [birth year]  MRN: [id number]  Age / Sex: 89 y.o., female  PCP: Aldo Hun, MD Referring Physician: Jodeane Mulligan, DO  Reason for Consultation: Establishing goals of care  HPI/Patient Profile: 89 y.o. female  with past medical history of dementia, hypertension, HFpEF, CAD, HLD, hypothyroidism, CKD stage 3a, anxiety, depression, GERD admitted on 07/14/2023 from home with loss of appetite, watery diarrhea, abdominal cramps with colitis +C. diff. Recent antibiotic use for mouth infection. Issues with SVT in ED.   Clinical Assessment and Goals of Care: Consult received and chart review completed. I discussed with RN. I met at bedside along with son [name]. We were joined at different times throughout the visit by granddaughter [place] and [place]. They share that Wendy Wiley and [place] live together. She has been able to ambulate and toilet self and do overall well until diarrhea began recently. [name] anticipates and knows that she will likely need much more assistance if she recovers from this illness and is not sure he will be able to provide this at home. We discussed that we will follow along for progress and we will work together along with CSW/ CMRN to help determine best next steps.   We did discuss her diagnosis of C. diff infection and colitis. We discussed safety with C. diff for all and especially for granddaughters that have younger children to ensure this is not passed to them. We reviewed concerning issues to look for that could indicate worsening. We discussed treatment plan but also acknowledging that even if we are able to treat C. diff appropriately that there is risk for decline. We discussed weakness and potential for failure to thrive with physical decline as well as possibility of cognitive decline with known dementia often  complicated/worsened with acute serious illness. We discussed the what ifs and how we should respond and care for her if she declines despite treatment. After some discussion family all agree that they do not want her to have any pain or suffering. Anything that can cause her suffering they wish to avoid. We discussed that invasive measures such as dialysis, invasive central line placement, and vasopressors would be more likely to lead to suffering than good outcome and they agree this is what they wish to avoid. They are hopeful for improvement but also accepting if this is not what is meant to be as they want her quality of life optimized and they know in the setting of dementia there would be ongoing decline expected.   Family share that Wendy Wiley is a woman of strong faith. Her eyesight has limited her ability to read the bible daily as she used to do routinely. They know that she is ready to go when it is her time. Wendy Wiley awakens at times throughout conversation. She does tell us  as long as God keeps me here I will continue to try and serve him while I am here. She has also been seeing relatives that are  deceased. Family are very good at reassuring her and distracting her. She shows no signs of any discomfort or anxiety.   All questions/concerns addressed. Emotional support provided.   Primary Decision Maker NEXT OF KIN son [name]    SUMMARY OF RECOMMENDATIONS   - DNR/DNI - Continue conservative medical management - Avoid invasive measures that are likely to cause pain/discomfort and likely to prolong her suffering in the event of further decline - Family open to consideration of comfort if further decline and concern for suffering - I will follow  Code Status/[facility] Care Planning: DNR   Symptom Management:  Per attending  Prognosis:  Guarded - time for outcomes  Discharge Planning: To Be Determined      Primary Diagnoses: Present on Admission:  Colitis  Prolonged QT  interval  Transient hypotension  Leukocytosis  Acute kidney injury superimposed on chronic kidney disease (HCC)  Hypokalemia  Hypothyroidism  Hyperlipidemia  GERD  Coronary atherosclerosis  Acute respiratory failure with hypoxia (HCC)  Cholelithiasis   I have reviewed the medical record, interviewed the patient and family, and examined the patient. The following aspects are pertinent.  Past Medical History:  Diagnosis Date   Anginal pain (HCC)    Anxiety    Arthritis    Depression    GERD (gastroesophageal reflux disease)    Hypertension    Hypothyroidism    Social History   Socioeconomic History   Marital status: Widowed    Spouse name: Not on file   Number of children: Not on file   Years of education: Not on file   Highest education level: Not on file  Occupational History   Not on file  Tobacco Use   Smoking status: Never   Smokeless tobacco: Never  Substance and Sexual Activity   Alcohol use: No   Drug use: No   Sexual activity: Yes    Birth control/protection: Post-menopausal  Other Topics Concern   Not on file  Social History Narrative   Not on file   Social Drivers of Health   Financial Resource Strain: Not on file  Food Insecurity: Not on file  Transportation Needs: Not on file  Physical Activity: Not on file  Stress: Not on file  Social Connections: Not on file   History reviewed. No pertinent family history. Scheduled Meds:  aspirin  EC  81 mg Oral QHS   cyanocobalamin   1,000 mcg Oral Daily   famotidine  20 mg Oral BID   fluticasone  furoate-vilanterol  1 puff Inhalation Daily   levothyroxine   200 mcg Oral QAC breakfast   potassium chloride   40 mEq Oral Once   rosuvastatin  20 mg Oral Daily   saccharomyces boulardii  250 mg Oral BID   sodium chloride  flush  3 mL Intravenous Q12H   vancomycin  125 mg Oral TID AC & HS   Continuous Infusions:  amiodarone     heparin 750 Units/hr (07/15/23 0218)   lactated ringers     lactated ringers 100  mL/hr at 07/14/23 2031   PRN Meds:.acetaminophen  **OR** acetaminophen , levalbuterol , trimethobenzamide Allergies  Allergen Reactions   Codeine Other (See Comments)    Reaction:  Altered mental status    Ciprofloxacin Other (See Comments)    Reaction:  Unknown    Penicillins Other (See Comments)    Reaction type/severity unknown   Quinine Swelling   Review of Systems  Unable to perform ROS: Dementia    Physical Exam Vitals and nursing note reviewed.  Constitutional:  General: She is not in acute distress.    Appearance: She is ill-appearing.   Cardiovascular:     Rate and Rhythm: Tachycardia present.     Comments: Afib Pulmonary:     Effort: No tachypnea, accessory muscle usage or respiratory distress.  Abdominal:     Palpations: Abdomen is soft.     Tenderness: There is no abdominal tenderness.   Neurological:     Mental Status: She is easily aroused.     Comments: Fluctuating wakefulness. Oriented to self, knows family at bedside. Visual hallucinations of deceased family members.     Vital Signs: BP (!) 118/53   Pulse (!) 118   Temp 98.9 F (37.2 C) (Oral)   Resp (!) 24   Ht 4' 10 (1.473 m) Comment: Family at bedside  Wt 63.5 kg Comment: Family at bedside  SpO2 94%   BMI 29.26 kg/m          SpO2: SpO2: 94 % O2 Device:SpO2: 94 % O2 Flow Rate: .O2 Flow Rate (L/min): 2 L/min  IO: Intake/output summary: No intake or output data in the 24 hours ending 07/15/23 0730  LBM: Last BM Date : 07/15/23 Baseline Weight: Weight: 63.5 kg (Family at bedside) Most recent weight: Weight: 63.5 kg (Family at bedside)     Palliative Assessment/Data:     Time Total: 110 min  Greater than 50%  of this time was spent counseling and coordinating care related to the above assessment and plan.  Signed by: [name], NP Palliative Medicine Team Pager # [phone] (M-F 8a-5p) Team Phone # [phone] (Nights/Weekends)

## 2023-07-15 NOTE — Progress Notes (Signed)
 PHARMACY - ANTICOAGULATION CONSULT NOTE  Pharmacy Consult for heparin Indication: atrial fibrillation  Allergies  Allergen Reactions   Codeine Other (See Comments)    Reaction:  Altered mental status    Ciprofloxacin Other (See Comments)    Reaction:  Unknown    Penicillins Other (See Comments)    Reaction type/severity unknown   Quinine Swelling    Patient Measurements: Height: 4' 10 (147.3 cm) Weight: 49.6 kg (109 lb 5.6 oz) IBW/kg (Calculated) : 40.9 HEPARIN DW (KG): 49.6  Vital Signs: Temp: 98.7 F (37.1 C) (06/17 1459) Temp Source: Axillary (06/17 1459) BP: 130/68 (06/17 1459) Pulse Rate: 116 (06/17 1459)  Labs: Recent Labs    07/14/23 1139 07/14/23 2221 07/15/23 0448 07/15/23 0830 07/15/23 1535  HGB 11.9*  --  11.4*  --   --   HCT 36.0  --  34.9*  --   --   PLT 254  --  246  --   --   LABPROT 15.2  --   --   --   --   INR 1.2  --   --   --   --   HEPARINUNFRC  --   --   --  0.52 0.62  CREATININE 3.60* 3.10* 2.71*  --   --   TROPONINIHS  --   --   --  155*  --     Estimated Creatinine Clearance: 8.1 mL/min (A) (by C-G formula based on SCr of 2.71 mg/dL (H)).  Assessment: 89 y.o. female with new onset atrial fibrillation. Not on anticoagulation PTA. Pharmacy consulted to dose heparin in setting of afib.   Heparin level remains therapeutic (0.62) on 750 units/hr. CBC stable. No signs of bleeding noted.  Goal of Therapy:  Heparin level 0.3-0.7 units/ml Monitor platelets by anticoagulation protocol: Yes   Plan:  Continue heparin at 750 units/hr  Daily heparin level and CBC  Monitor for s/sx of bleeding F/u long term AC plans  Enrigue Harvard, PharmD, BCPS Please see amion for complete clinical pharmacist phone list 07/15/2023,4:26 PM

## 2023-07-15 NOTE — TOC CM/SW Note (Signed)
 Transition of Care [facility]) - Inpatient Brief Assessment   Patient Details  Name: Wendy Wiley MRN: [id number] Date of Birth: 1923-01-23  Transition of Care [facility]) CM/SW Contact:    Juliane Och, LCSW Phone Number: 07/15/2023, 3:05 PM   Clinical Narrative:  3:05 PM Per chart review, patient resides at home with adult child(ren). Patient has a PCP and insurance. Patient does not have SNF history. Patient has HH history with [facility]. Patient has DME (nebulizer machine, walker) with [facility]. Patient's preferred pharmacy's are [facility] [phone] [facility] and [facility]. Patient has history of memory impairment. No TOC needs were identified at this time. TOC will continue to follow and be available to assist.  Transition of Care Asessment: Insurance and Status: Insurance coverage has been reviewed Patient has primary care physician: Yes Home environment has been reviewed: Private Residence Prior level of function:: N/A Prior/Current Home Services: No current home services Social Drivers of Health Review:  (Patient unable to answer) Readmission risk has been reviewed: Yes Transition of care needs: no transition of care needs at this time

## 2023-07-15 NOTE — ED Notes (Signed)
 Pt had medium amount of loose stool- cleaned and changed.

## 2023-07-15 NOTE — Consult Note (Addendum)
 Cardiology Consultation   Patient ID: Wendy Wiley MRN: [id number]; DOB: 02/12/1922  Admit date: 07/14/2023 Date of Consult: 07/15/2023  PCP:  Aldo Hun, MD   [facility] Providers Cardiologist:  Olinda Bertrand, DO    Dr. Leatha Province   Patient Profile: Wendy Wiley is a 89 y.o. female with a hx of HTN, hypothyroidism, GERD, depression, anxiety, dementia, CAD who is being seen 07/15/2023 for the evaluation of SVT at the request of Dr. Macarthur Savory.  History of Present Illness: Ms. Seals is a 89 year old female with above medical history. Patient was previously followed by Dr. Katheryne Pane. She had undergone coronary bypass grafting in 1999. Later was seen in the ED in 2018 with chest pain. She underwent echocardiogram 01/08/17 that showed EF 65-70%, no regional wall motion abnormalities, grade I DD, mild mitral valve regurgitation. Carotid ultrasounds 01/15/27 showed 1-39% stenosis in bilateral ICAs. Stress test 01/14/17 was a normal, low risk study. Normal perfusion and wall motion. She was last seen by cardiology virtually in 09/2018. Was doing well at that time   Patient presented to the ED on 6/16 complaining of failure to thrive, diarrhea for multiple weeks, unable to ambulate for 2 days, anorexia for 2 days. When EMS picked her up, BP 86/48. She was given 500 mL normal saline. Initial vital signs in the ED showed BP 91/45, oxygen  98% on nasal cannula, HR 83 BPM.   Initial labs in the ED significant for  - K <2.0 - Mag 2.2 - Creatinine 3.60  - Albumin 1.9  - WBC 35.7  - Hemoglobin 11.9 - platelets 254  - TSH 0.233  CXR showed low lung volumes with mild left basilar atelectasis, no acute cardiopulmonary findings. CT AP showed distended gallbladder with layering gallstones, areas of wall thickening in the right colol, hepatic flexure, and sigmoid colon concerning for infectious/inflammatory colitis.   While in the ED, patient became tachycardic with HR up to the 150s. EKG at 1923  concerning for atrial flutter with HR 144 BPM. Patient coughed, went back into NSR, but shortly after converted back to atrial flutter with HR 140s-150s. Given fluid bolus, 6 gm IV adenosine, metoprolol  IV with minimal effect. She was started on IV heparin and IV amiodarone. Cardiology consulted.   On interview, patient reports that she came to the ED because she was feeling poorly. Reports having generalized weakness, fatigue, poor appetite, nausea, and diarrhea. She denies chest pain. Denies palpitations, shortness of breath, dizziness, syncope, near syncope. Unaware of her elevated HR.    Past Medical History:  Diagnosis Date   Anginal pain (HCC)    Anxiety    Arthritis    Depression    GERD (gastroesophageal reflux disease)    Hypertension    Hypothyroidism     Past Surgical History:  Procedure Laterality Date   APPENDECTOMY     with ovarian cyst removal   COLONOSCOPY     CORONARY ARTERY BYPASS GRAFT     EYE SURGERY     B/L cataract   OVARIAN CYST REMOVAL     UPPER GI ENDOSCOPY       Scheduled Meds:  aspirin  EC  81 mg Oral QHS   cyanocobalamin   1,000 mcg Oral Daily   famotidine  20 mg Oral BID   fluticasone  furoate-vilanterol  1 puff Inhalation Daily   levothyroxine   200 mcg Oral QAC breakfast   metoprolol  tartrate  12.5 mg Oral TID   rosuvastatin  20 mg Oral Daily   saccharomyces  boulardii  250 mg Oral BID   sodium chloride  flush  3 mL Intravenous Q12H   vancomycin  125 mg Oral TID AC & HS   Continuous Infusions:  amiodarone 30 mg/hr (07/15/23 0805)   heparin 750 Units/hr (07/15/23 0218)   lactated ringers 100 mL/hr at 07/15/23 0805   PRN Meds: acetaminophen  **OR** acetaminophen , levalbuterol , trimethobenzamide  Allergies:    Allergies  Allergen Reactions   Codeine Other (See Comments)    Reaction:  Altered mental status    Ciprofloxacin Other (See Comments)    Reaction:  Unknown    Penicillins Other (See Comments)    Reaction type/severity unknown    Quinine Swelling    Social History:   Social History   Socioeconomic History   Marital status: Widowed    Spouse name: Not on file   Number of children: Not on file   Years of education: Not on file   Highest education level: Not on file  Occupational History   Not on file  Tobacco Use   Smoking status: Never   Smokeless tobacco: Never  Substance and Sexual Activity   Alcohol use: No   Drug use: No   Sexual activity: Yes    Birth control/protection: Post-menopausal  Other Topics Concern   Not on file  Social History Narrative   Not on file   Social Drivers of Health   Financial Resource Strain: Not on file  Food Insecurity: Not on file  Transportation Needs: Not on file  Physical Activity: Not on file  Stress: Not on file  Social Connections: Not on file  Intimate Partner Violence: Not on file    Family History:   History reviewed. No pertinent family history.   ROS:  Please see the history of present illness.   All other ROS reviewed and negative.     Physical Exam/Data: Vitals:   07/15/23 1015 07/15/23 1300 07/15/23 1351 07/15/23 1459  BP: (!) 114/58 (!) 117/54  130/68  Pulse: (!) 121 (!) 124  (!) 116  Resp: (!) 23 (!) 21  (!) 25  Temp:   97.6 F (36.4 C) 98.7 F (37.1 C)  TempSrc:   Oral Axillary  SpO2: 98% 98%  92%  Weight:    49.6 kg  Height:    4' 10 (1.473 m)   No intake or output data in the 24 hours ending 07/15/23 1803    07/15/2023    2:59 PM 07/14/2023   11:36 AM 10/09/2018    9:48 AM  Last 3 Weights  Weight (lbs) 109 lb 5.6 oz 140 lb 121 lb  Weight (kg) 49.6 kg 63.504 kg 54.885 kg     Body mass index is 22.85 kg/m.  General:  Elderly female, laying flat in the bed with head elevated. No acute distress  HEENT: normal Neck: no JVD Vascular: Radial pulses 2+ bilaterally Cardiac:  normal S1, S2; regular rhythm, tachycardic. No murmurs  Lungs:  Anterior lung exam clear. Breathing unlabored  Abd: soft, nontender  Ext: no edema in BLE   Musculoskeletal:  No deformities  Skin: warm and dry  Neuro:  CNs 2-12 intact, no focal abnormalities noted Psych:  Normal affect   Telemetry:  Telemetry was personally reviewed and demonstrates:  Atrial flutter with HR 123-125 BPM   Relevant CV Studies: Cardiac Studies & Procedures   ______________________________________________________________________________________________   STRESS TESTS  MYOCARDIAL PERFUSION IMAGING 01/14/2017  Narrative  The left ventricular ejection fraction is hyperdynamic (>65%).  Nuclear stress EF: 84%.  No T wave inversion was noted during stress.  There was no ST segment deviation noted during stress.  The study is normal.  This is a low risk study.  Normal perfusion. LVEF >70% with normal wall motion. This is a low risk study.   ECHOCARDIOGRAM  ECHOCARDIOGRAM COMPLETE 01/08/2017  Narrative *[facility]* [address] [place], [place] [address] [phone]  ------------------------------------------------------------------- Transthoracic Echocardiography  Patient:    Marialice, Newkirk MR #:       [id number] Study Date: 01/08/2017 Gender:     F Age:        78 Height:     149.9 cm Weight:     57.2 kg BSA:        1.56 m^2 Pt. Status: Room:  ATTENDING    Gaylyn Keas, MD ORDERING     Lauro Portal, MD REFERRING    Lauro Portal, MD SONOGRAPHER  Sherel Dikes, RDCS PERFORMING   [facility], Outpatient  cc:  ------------------------------------------------------------------- LV EF: 65% -   70%  ------------------------------------------------------------------- Indications:      Murmur (R01.1).  ------------------------------------------------------------------- History:   PMH:   Coronary artery disease.  Risk factors: Hypertension. Dyslipidemia.  ------------------------------------------------------------------- Study Conclusions  - Procedure narrative: Transthoracic echocardiography. Image quality was poor.  Intravenous contrast (Definity ) was administered. - Left ventricle: The cavity size was normal. Wall thickness was normal. Systolic function was vigorous. The estimated ejection fraction was in the range of 65% to 70%. Wall motion was normal; there were no regional wall motion abnormalities. Doppler parameters are consistent with abnormal left ventricular relaxation (grade 1 diastolic dysfunction). The E/e&' ratio is >15, suggesting elevated LV filling pressure. - Aortic valve: Trileaflet. Sclerosis without stenosis. There was trivial regurgitation. - Mitral valve: Mildly thickened leaflets . There was mild regurgitation. - Left atrium: The atrium was normal in size. - Right ventricle: The cavity size was normal. Mildly reduced systolic function. Lateral annulus peak S velocity: 9.36 cm/s. - Tricuspid valve: There was mild regurgitation. - Pulmonary arteries: PA peak pressure: 40 mm Hg (S). - Inferior vena cava: The vessel was normal in size. The respirophasic diameter changes were in the normal range (>= 50%), consistent with normal central venous pressure.  Impressions:  - Compared to a prior study in 2011, there are few changes. LVEF is 65-70% with aortic valve sclerosis, trivial AI and mild MR with normal LA size, mild TR and RVSP of 40 mmHg.  ------------------------------------------------------------------- Labs, prior tests, procedures, and surgery: Transthoracic echocardiography (08/16/2009).     EF was 65% and PA pressure was 41 (systolic).  ------------------------------------------------------------------- Study data:  Comparison was made to the study of 08/16/2009.  Study status:  Routine.  Procedure:  The patient reported no pain pre or post test. Transthoracic echocardiography. Image quality was poor. Intravenous contrast (Definity ) was administered.  Study completion:  There were no complications.          Transthoracic echocardiography.  M-mode, complete 2D,  spectral Doppler, and color Doppler.  Birthdate:  Patient birthdate: 10-Apr-1922.  Age:  Patient is 89 yr old.  Sex:  Gender: female.    BMI: 25.4 kg/m^2.  Blood pressure:     150/70  Patient status:  Outpatient.  Study date: Study date: 01/08/2017. Study time: 03:52 PM.  Location:  [facility]  -------------------------------------------------------------------  ------------------------------------------------------------------- Left ventricle:  The cavity size was normal. Wall thickness was normal. Systolic function was vigorous. The estimated ejection fraction was in the range of 65% to 70%. Wall motion was  normal; there were no regional wall motion abnormalities. Doppler parameters are consistent with abnormal left ventricular relaxation (grade 1 diastolic dysfunction). The E/e&' ratio is >15, suggesting elevated LV filling pressure.  ------------------------------------------------------------------- Aortic valve:   Trileaflet. Sclerosis without stenosis.  Doppler: Transvalvular velocity was minimally increased. There was trivial regurgitation.  ------------------------------------------------------------------- Aorta:  Aortic root: The aortic root was normal in size. Ascending aorta: The ascending aorta was normal in size.  ------------------------------------------------------------------- Mitral valve:   Mildly thickened leaflets .  Doppler:  There was mild regurgitation.    Peak gradient (D): 4 mm Hg.  ------------------------------------------------------------------- Left atrium:  The atrium was normal in size.  ------------------------------------------------------------------- Atrial septum:  Poorly visualized.  ------------------------------------------------------------------- Right ventricle:  The cavity size was normal. Mildly reduced systolic function.  ------------------------------------------------------------------- Pulmonic valve:    The valve  appears to be grossly normal. Doppler:  There was trivial regurgitation.  ------------------------------------------------------------------- Tricuspid valve:   Doppler:  There was mild regurgitation.  ------------------------------------------------------------------- Pulmonary artery:   The main pulmonary artery was normal-sized.  ------------------------------------------------------------------- Right atrium:  The atrium was normal in size.  ------------------------------------------------------------------- Pericardium:  There was no pericardial effusion.  ------------------------------------------------------------------- Systemic veins: Inferior vena cava: The vessel was normal in size. The respirophasic diameter changes were in the normal range (>= 50%), consistent with normal central venous pressure.  ------------------------------------------------------------------- Measurements  Left ventricle                         Value        Reference LV ID, ED, PLAX chordal        (L)     40.5  mm     43 - 52 LV ID, ES, PLAX chordal                27    mm     23 - 38 LV fx shortening, PLAX chordal         33    %      >=29 LV PW thickness, ED                    7.1   mm     --------- IVS/LV PW ratio, ED                    1.17         <=1.3 Stroke volume, 2D                      116   ml     --------- Stroke volume/bsa, 2D                  74    ml/m^2 --------- LV e&', lateral                         5.98  cm/s   --------- LV E/e&', lateral                       17.73        --------- LV e&', medial                          6.64  cm/s   --------- LV E/e&', medial  15.96        --------- LV e&', average                         6.31  cm/s   --------- LV E/e&', average                       16.8         ---------  Ventricular septum                     Value        Reference IVS thickness, ED                      8.34  mm     ---------  LVOT                                    Value        Reference LVOT ID, S                             19    mm     --------- LVOT area                              2.84  cm^2   --------- LVOT peak velocity, S                  164   cm/s   --------- LVOT mean velocity, S                  114   cm/s   --------- LVOT VTI, S                            40.7  cm     --------- LVOT peak gradient, S                  11    mm Hg  ---------  Aorta                                  Value        Reference Aortic root ID, ED                     29    mm     --------- Ascending aorta ID, A-P, S             27    mm     ---------  Left atrium                            Value        Reference LA ID, A-P, ES                         41    mm     --------- LA ID/bsa, A-P                 (H)     2.63  cm/m^2 <=2.2 LA volume,  S                           25.7  ml     --------- LA volume/bsa, S                       16.5  ml/m^2 --------- LA volume, ES, 1-p A4C                 20.7  ml     --------- LA volume/bsa, ES, 1-p A4C             13.3  ml/m^2 --------- LA volume, ES, 1-p A2C                 29.6  ml     --------- LA volume/bsa, ES, 1-p A2C             19    ml/m^2 ---------  Mitral valve                           Value        Reference Mitral E-wave peak velocity            106   cm/s   --------- Mitral A-wave peak velocity            126   cm/s   --------- Mitral deceleration time       (H)     246   ms     150 - 230 Mitral peak gradient, D                4     mm Hg  --------- Mitral E/A ratio, peak                 0.8          ---------  Pulmonary arteries                     Value        Reference PA pressure, S, DP             (H)     40    mm Hg  <=30  Tricuspid valve                        Value        Reference Tricuspid regurg peak velocity         281   cm/s   --------- Tricuspid peak RV-RA gradient          32    mm Hg  ---------  Systemic veins                         Value        Reference Estimated  CVP                          8     mm Hg  ---------  Right ventricle                        Value        Reference TAPSE  15.9  mm     --------- RV pressure, S, DP             (H)     40    mm Hg  <=30 RV s&', lateral, S                      9.36  cm/s   ---------  Legend: (L)  and  (H)  mark values outside specified reference range.  ------------------------------------------------------------------- Prepared and Electronically Authenticated by  Dinah Franco MD 2018-12-12T17:25:58          ______________________________________________________________________________________________       Laboratory Data: High Sensitivity Troponin:   Recent Labs  Lab 07/15/23 0830 07/15/23 1535  TROPONINIHS 155* 138*     Chemistry Recent Labs  Lab 07/14/23 1139 07/14/23 1432 07/14/23 2221 07/15/23 0448  NA 135  --  136 136  K <2.0*  --  2.8* 3.1*  CL 92*  --  98 101  CO2 25  --  23 21*  GLUCOSE 93  --  98 114*  BUN 46*  --  41* 36*  CREATININE 3.60*  --  3.10* 2.71*  CALCIUM 7.3*  --  7.0* 6.9*  MG  --  2.2 2.0  --   GFRNONAA 11*  --  13* 15*  ANIONGAP 18*  --  15 14    Recent Labs  Lab 07/14/23 1139 07/14/23 2221 07/15/23 0448  PROT 5.4* 4.9* 4.8*  ALBUMIN 1.9* 1.7* 1.6*  AST 17 19 21   ALT 9 9 10   ALKPHOS 85 75 78  BILITOT 1.7* 1.0 0.6   Lipids No results for input(s): CHOL, TRIG, HDL, LABVLDL, LDLCALC, CHOLHDL in the last 168 hours.  Hematology Recent Labs  Lab 07/14/23 1139 07/15/23 0448  WBC 35.7* 27.7*  RBC 4.38 4.21  HGB 11.9* 11.4*  HCT 36.0 34.9*  MCV 82.2 82.9  MCH 27.2 27.1  MCHC 33.1 32.7  RDW 15.9* 16.2*  PLT 254 246   Thyroid   Recent Labs  Lab 07/14/23 1432 07/15/23 0448  TSH 0.233*  --   FREET4  --  1.85*    BNPNo results for input(s): BNP, PROBNP in the last 168 hours.  DDimer No results for input(s): DDIMER in the last 168 hours.  Radiology/Studies:  US  Abdomen Limited RUQ  (LIVER/GB) Result Date: 07/14/2023 CLINICAL DATA:  Abdominal pain EXAM: ULTRASOUND ABDOMEN LIMITED RIGHT UPPER QUADRANT COMPARISON:  Ultrasound 2009. CT scan without contrast earlier 02/13/2023 FINDINGS: Gallbladder: Dilated gallbladder. Length up to 9.6 cm. No wall thickening or adjacent fluid. No shadowing stones. Common bile duct: Diameter: 3 mm Liver: No focal lesion identified. Within normal limits in parenchymal echogenicity. Portal vein is patent on color Doppler imaging with normal direction of blood flow towards the liver. Other: None. IMPRESSION: Dilated gallbladder. No shadowing stones or ductal dilatation. If there is further concern of the gallbladder additional workup may be useful such as HIDA scan or MRCP as clinically appropriate Electronically Signed   By: Adrianna Horde M.D.   On: 07/14/2023 16:41   CT ABDOMEN PELVIS WO CONTRAST Result Date: 07/14/2023 CLINICAL DATA:  Abdominal pain EXAM: CT ABDOMEN AND PELVIS WITHOUT CONTRAST TECHNIQUE: Multidetector CT imaging of the abdomen and pelvis was performed following the standard protocol without IV contrast. RADIATION DOSE REDUCTION: This exam was performed according to the departmental dose-optimization program which includes automated exposure control, adjustment of the mA and/or kV according to patient size and/or use of iterative reconstruction technique.  COMPARISON:  None Available. FINDINGS: Lower chest: Small right pleural effusion. Bibasilar atelectasis. Cardiomegaly, coronary artery disease, aortic atherosclerosis. Hepatobiliary: Gallbladder is distended. Small layering stones within the gallbladder. No biliary ductal dilatation. No focal hepatic abnormality. Pancreas: No focal abnormality or ductal dilatation.  Mild atrophy. Spleen: No focal abnormality.  Normal size. Adrenals/Urinary Tract: No adrenal abnormality. No focal renal abnormality. No stones or hydronephrosis. Urinary bladder is unremarkable. Stomach/Bowel: Appears to be mild  wall thickening within portions of the colon, best seen in the right colon and at the hepatic flexure as well as sigmoid colon. Findings concerning for colitis. No bowel obstruction. Stomach and small bowel decompressed, unremarkable. Vascular/Lymphatic: Aortic atherosclerosis. No evidence of aneurysm or adenopathy. Reproductive: Uterus and adnexa unremarkable.  No mass. Other: No free fluid or free air. Musculoskeletal: No acute bony abnormality. IMPRESSION: Distended gallbladder with layering gallstones. This could be further evaluated with right upper quadrant ultrasound if there is clinical concern for acute cholecystitis. Areas of wall thickening in the right colon, hepatic flexure, and sigmoid colon concerning for infectious/inflammatory colitis. Aortic atherosclerosis. Small right pleural effusion, bibasilar atelectasis. Electronically Signed   By: Janeece Mechanic M.D.   On: 07/14/2023 15:04   DG Chest Port 1 View Result Date: 07/14/2023 CLINICAL DATA:  Concern for sepsis. EXAM: PORTABLE CHEST 1 VIEW COMPARISON:  02/04/2018. FINDINGS: The heart size and mediastinal contours are within normal limits. Prior median sternotomy and CABG. Aortic atherosclerosis. Low lung volumes. Mild left basilar atelectasis/scarring. No focal consolidation, sizeable pleural effusion, or pneumothorax. No acute osseous abnormality. IMPRESSION: Low lung volumes with mild left basilar atelectasis/scarring. Otherwise, no acute cardiopulmonary findings. Electronically Signed   By: Mannie Seek M.D.   On: 07/14/2023 12:52     Assessment and Plan:  Atrial Flutter with RVR - Patient currently admitted with acute C diff colitis, AKI. Found to be in atrial flutter with RVR. Initially, HR was up to the 150s. She was given IV fluids and started on IV amiodarone. Currently remains in atrial flutter with HR in the 120s - She is asymptomatic- no chest pain, shortness of breath, or palpitations - Suspect that atrial flutter driven by  c. Diff infection, diarrhea, hypokalemia (K <2 on arrival, now 3.1)  - Continue IV heparin for now. Discussed possibility of long term AC with patient and son at bedside. They are open to considering. Will continue to discuss and evaluate as she progresses  - TEE guided DCCV does not align with her current goals of care. We will attempt to manage with medication - Continue IV amiodarone - BP has improved. Start metoprolol  tartrate 12.5 mg TID. Note, her son mentioned that she sometimes has difficulty swallowing pills if they are not given with apple sauce/other foods. If she has difficulty swallowing can transition to IV metoprolol   -Ordered echo to evaluate EF- If EF preserved, consider transitioning from IV amiodarone to IV dilt   CAD  - Previously had bypass grafting in 1999 - Stress test in 2018 was a normal low risk study  - No chest pain  - Continue ASA 81 mg daily  - Continue crestor 20 mg daily for now.   Elevated troponin  - hsTn 155  - She is without chest pain. Suspect demand ischemia in the setting of aflutter with RVR and acute illness. She also has AKI with creatinine up to 3.6  - No plans for ischemic evaluation at this time   Otherwise per primary  - Acute C. Diff colitis  -  AKI on CKD stage IIIa  - Hypokalemia  - Hypothyroidism  - Goals of care- patient seen by palliative care earlier today. She is DNR/DNI. Continuing conservative medical management while avoiding invasive measures    Risk Assessment/Risk Scores:   CHA2DS2-VASc Score = 5  This indicates a 7.2% annual risk of stroke. The patient's score is based upon: CHF History: 0 HTN History: 1 Diabetes History: 0 Stroke History: 0 Vascular Disease History: 1 Age Score: 2 Gender Score: 1    For questions or updates, please contact [facility] Please consult [email or web address] for contact info under    Signed, Debria Fang, PA-C  07/15/2023 3:19 PM  ADDENDUM:   Patient seen and examined  with Liane Redman, PA-C.  I personally taken a history, examined the patient, reviewed relevant notes,  laboratory data / imaging studies.  I performed a substantive portion of this encounter and formulated the important aspects of the plan.  I agree with the APP's note, impression, and recommendations; however, I have edited the note to reflect changes or salient points.   Brief overview: 89 year old Caucasian female with complex past medical history presents to the hospital due to diarrhea, weakness, not consuming meals, concerns for failure to thrive.  Patient being treated for C. difficile colitis, dehydration and multiple electrolyte abnormalities.  Cardiology consulted for narrow complex tachycardia concerning for atrial flutter with rapid ventricular rate.  Patient is a poor historian and has underlying dementia.  She is accompanied by her son who is the next of kin and 2 granddaughters who are providing collateral history.  Patient has history of coronary artery disease status post CABG and used to follow-up with Dr. Dean Every. No anginal chest pain or heart failure symptoms.  No prior history of atrial fibrillation or flutter. No prior history of gastrointestinal bleeding or intracranial bleeding. She does have tendencies for imbalance and falls.  PHYSICAL EXAM: Today's Vitals   07/15/23 1015 07/15/23 1300 07/15/23 1351 07/15/23 1459  BP: (!) 114/58 (!) 117/54  130/68  Pulse: (!) 121 (!) 124  (!) 116  Resp: (!) 23 (!) 21  (!) 25  Temp:   97.6 F (36.4 C) 98.7 F (37.1 C)  TempSrc:   Oral Axillary  SpO2: 98% 98%  92%  Weight:    49.6 kg  Height:    4' 10 (1.473 m)   Body mass index is 22.85 kg/m.   Net IO Since Admission: No IO data has been entered for this period [07/15/23 1803]  Filed Weights   07/14/23 1136 07/15/23 1459  Weight: 63.5 kg 49.6 kg    General: Hemodynamically stable, age-appropriate female, pleasantly demented, poor dentition HEENT: Normocephalic,  atraumatic, trachea midline, no JVP, poor dentition, dry oral cavity Lungs: Clear to auscultation anteriorly and laterally, no rales or rhonchi's Heart: Tachycardia, irregularly irregular, no murmurs rubs or gallops appreciated secondary to tachycardia Abdomen: Soft, nontender, nondistended, hypoactive bowel sounds x 4 quadrants Extremities: Warm to touch, no pitting edema Neuro: Pleasantly demented, moves all 4 extremities, not aware to place, time, location (family endorses that she does have dementia/delirium)  EKG: (personally reviewed by me) July 14, 2023 1136: Sinus rhythm, 80 bpm, nonspecific ST-T changes, prolonged QT  1923: Narrow complex tachycardia likely atrial flutter, 144 bpm, ST-T changes likely secondary to repolarization/rate related changes.  1937: Sinus rhythm 82 bpm, subtle ST depressions  1942: Atrial flutter with rapid ventricular rate, 145 bpm  July 15, 2023:  06:40 Atrial flutter 114 bpm  06:51 atrial flutter  118 bpm  Telemetry: (personally reviewed by me) Atrial flutter with controlled ventricular rate   Impression:  New onset of atrial flutter with rapid ventricular rate Coronary artery disease status post CABG. Elevated troponins likely demand ischemia. Acute C. difficile colitis/dehydration/?  Sepsis Acute kidney injury on chronic kidney disease stage III. Hypokalemia, Hypothyroidism,?  Iatrogenic hyperthyroidism  Recommendations:  New onset of atrial flutter with rapid ventricular rate Ventricular rate improving while on IV amiodarone drip. Has had episodes of intermittent sinus rhythm. Likely precipitated by acute C. difficile colitis/?  Sepsis, dehydration, multiple electrolyte abnormalities, thyroid  disease Rate control: Will start low-dose Lopressor , titrate and transition to Toprol -XL closer to discharge Rhythm control: IV amiodarone drip Thromboembolic prophylaxis: Started IV heparin drip based on CHA2DS2-VASc SCORE  Defer management of  dehydration, C. difficile colitis, electrode abnormalities to primary team. Known history of hypothyroidism, TSH is low concerning for iatrogenic hyperthyroidism, defer thyroid  management to primary team. Plan echo. If LVEF is preserved or low normal will consider Cardizem and avoid long-term amiodarone use given underlying thyroid  disease. We discussed long-term oral anticoagulation, family to discuss as they are considering conservative management. Very hopeful that she will restore normal sinus rhythm once her underlying active problem list start improving/resolving. Will follow for now. Continue telemetry   Coronary artery disease status post CABG. Elevated troponins likely demand ischemia. Denies anginal chest pain prior to arrival to the ED.  No active discomfort. Troponin leak likely secondary to the above EKG is nonischemic Echocardiogram planned  Acute C. difficile colitis/dehydration/?  Sepsis: Currently on antibiotics, followed by primary team  Acute kidney injury on chronic kidney disease stage III: Recommend hydration, defer to primary team  Hypokalemia: Close monitoring of potassium levels and electrolyte abnormalities and replace as needed  Hypothyroidism,?  Iatrogenic hyperthyroidism: Consider reducing Synthroid  dose, final decision to primary team  Defer management of other chronic comorbid conditions to primary team   Further recommendations to follow as the case evolves.   This note was created using a voice recognition software as a result there may be grammatical errors inadvertently enclosed that do not reflect the nature of this encounter. Every attempt is made to correct such errors.   Olinda Bertrand, DO, [facility]  [address] [place], [place] [address] Pager: [phone] Office: [phone] 07/15/2023 6:03 PM

## 2023-07-16 ENCOUNTER — Inpatient Hospital Stay (HOSPITAL_COMMUNITY)

## 2023-07-16 DIAGNOSIS — Z515 Encounter for palliative care: Secondary | ICD-10-CM | POA: Diagnosis not present

## 2023-07-16 DIAGNOSIS — F0390 Unspecified dementia without behavioral disturbance: Secondary | ICD-10-CM

## 2023-07-16 DIAGNOSIS — A0472 Enterocolitis due to Clostridium difficile, not specified as recurrent: Secondary | ICD-10-CM | POA: Diagnosis not present

## 2023-07-16 DIAGNOSIS — I4891 Unspecified atrial fibrillation: Secondary | ICD-10-CM | POA: Diagnosis not present

## 2023-07-16 DIAGNOSIS — Z7189 Other specified counseling: Secondary | ICD-10-CM | POA: Diagnosis not present

## 2023-07-16 DIAGNOSIS — I4892 Unspecified atrial flutter: Secondary | ICD-10-CM

## 2023-07-16 LAB — BASIC METABOLIC PANEL WITH GFR
Anion gap: 10 (ref 5–15)
Anion gap: 11 (ref 5–15)
BUN: 30 mg/dL — ABNORMAL HIGH (ref 8–23)
BUN: 27 mg/dL — ABNORMAL HIGH (ref 8–23)
CO2: 23 mmol/L (ref 22–32)
CO2: 20 mmol/L — ABNORMAL LOW (ref 22–32)
Calcium: 7.3 mg/dL — ABNORMAL LOW (ref 8.9–10.3)
Calcium: 7.4 mg/dL — ABNORMAL LOW (ref 8.9–10.3)
Chloride: 104 mmol/L (ref 98–111)
Chloride: 103 mmol/L (ref 98–111)
Creatinine, Ser: 2.4 mg/dL — ABNORMAL HIGH (ref 0.44–1.00)
Creatinine, Ser: 2.56 mg/dL — ABNORMAL HIGH (ref 0.44–1.00)
GFR, Estimated: 18 mL/min — ABNORMAL LOW (ref >60)
GFR, Estimated: 16 mL/min — ABNORMAL LOW (ref >60)
Glucose, Bld: 109 mg/dL — ABNORMAL HIGH (ref 70–99)
Glucose, Bld: 149 mg/dL — ABNORMAL HIGH (ref 70–99)
Potassium: 2.7 mmol/L — CL (ref 3.5–5.1)
Potassium: 2.9 mmol/L — ABNORMAL LOW (ref 3.5–5.1)
Sodium: 137 mmol/L (ref 135–145)
Sodium: 134 mmol/L — ABNORMAL LOW (ref 135–145)

## 2023-07-16 LAB — ECHOCARDIOGRAM COMPLETE
Area-P 1/2: 4.57 cm2
Height: 58 in
S' Lateral: 2.1 cm
Weight: 1749.57 [oz_av]

## 2023-07-16 LAB — CBC
HCT: 34.2 % — ABNORMAL LOW (ref 36.0–46.0)
Hemoglobin: 11.1 g/dL — ABNORMAL LOW (ref 12.0–15.0)
MCH: 26.6 pg (ref 26.0–34.0)
MCHC: 32.5 g/dL (ref 30.0–36.0)
MCV: 81.8 fL (ref 80.0–100.0)
Platelets: 243 10*3/uL (ref 150–400)
RBC: 4.18 MIL/uL (ref 3.87–5.11)
RDW: 16.3 % — ABNORMAL HIGH (ref 11.5–15.5)
WBC: 23 10*3/uL — ABNORMAL HIGH (ref 4.0–10.5)
nRBC: 0 % (ref 0.0–0.2)

## 2023-07-16 LAB — MAGNESIUM
Magnesium: 1.6 mg/dL — ABNORMAL LOW (ref 1.7–2.4)
Magnesium: 2.6 mg/dL — ABNORMAL HIGH (ref 1.7–2.4)

## 2023-07-16 LAB — HEPARIN LEVEL (UNFRACTIONATED): Heparin Unfractionated: 0.53 [IU]/mL (ref 0.30–0.70)

## 2023-07-16 MED ORDER — POTASSIUM CHLORIDE 20 MEQ PO PACK
40.0000 meq | PACK | Freq: Once | ORAL | Status: AC
  Administered 2023-07-16: 40 meq via ORAL
  Filled 2023-07-16: qty 2

## 2023-07-16 MED ORDER — POTASSIUM CHLORIDE 10 MEQ/100ML IV SOLN
10.0000 meq | INTRAVENOUS | Status: AC
  Administered 2023-07-16 (×4): 10 meq via INTRAVENOUS
  Filled 2023-07-16 (×4): qty 100

## 2023-07-16 MED ORDER — METOPROLOL TARTRATE 25 MG PO TABS
25.0000 mg | ORAL_TABLET | Freq: Two times a day (BID) | ORAL | Status: DC
  Filled 2023-07-16: qty 1

## 2023-07-16 MED ORDER — METOPROLOL TARTRATE 25 MG PO TABS
25.0000 mg | ORAL_TABLET | Freq: Two times a day (BID) | ORAL | Status: DC
  Administered 2023-07-16 – 2023-07-17 (×2): 25 mg via ORAL
  Filled 2023-07-16 (×2): qty 1

## 2023-07-16 MED ORDER — MAGNESIUM SULFATE 2 GM/50ML IV SOLN
2.0000 g | Freq: Once | INTRAVENOUS | Status: AC
  Administered 2023-07-16: 2 g via INTRAVENOUS
  Filled 2023-07-16: qty 50

## 2023-07-16 MED ORDER — POTASSIUM CHLORIDE 20 MEQ PO PACK: 40.0000 meq | PACK | Freq: Once | ORAL | Status: DC

## 2023-07-16 MED ORDER — POTASSIUM CHLORIDE 10 MEQ/100ML IV SOLN
10.0000 meq | INTRAVENOUS | Status: AC
  Administered 2023-07-16 (×3): 10 meq via INTRAVENOUS
  Filled 2023-07-16 (×3): qty 100

## 2023-07-16 MED ORDER — VANCOMYCIN 50 MG/ML ORAL SOLUTION
125.0000 mg | Freq: Three times a day (TID) | ORAL | Status: DC
  Administered 2023-07-16 – 2023-07-18 (×7): 125 mg via ORAL
  Filled 2023-07-16 (×9): qty 2.5

## 2023-07-16 MED ORDER — CARMEX CLASSIC LIP BALM EX OINT
TOPICAL_OINTMENT | CUTANEOUS | Status: DC | PRN
  Filled 2023-07-16: qty 10

## 2023-07-16 NOTE — TOC Initial Note (Signed)
 Transition of Care [facility]) - Initial/Assessment Note    Patient Details  Name: Wendy Wiley MRN: [id number] Date of Birth: [birth year]  [facility]) CM/SW Contact:    Juliane Och, LCSW Phone Number: 07/16/2023, 12:38 PM  Clinical Narrative:                  12:38 PM Per progressions, physical therapy informed RNCM that patient and patient's son were agreeable to patient discharging to SNF. CSW sent referral to SNFs within [facility].   Expected Discharge Plan: Skilled Nursing Facility Barriers to Discharge: Continued Medical Work up, SNF Pending bed offer, Insurance Authorization   Patient Goals and CMS Choice Patient states their goals for this hospitalization and ongoing recovery are:: SNF          Expected Discharge Plan and Services In-house Referral: Clinical Social Work   Post Acute Care Choice: Skilled Nursing Facility Living arrangements for the past 2 months: Single Family Home                                      Prior Living Arrangements/Services Living arrangements for the past 2 months: Single Family Home Lives with:: Adult Children Patient language and need for interpreter reviewed:: Yes        Need for Family Participation in Patient Care: Yes (Comment)     Criminal Activity/Legal Involvement Pertinent to Current Situation/Hospitalization: No - Comment as needed  Activities of Daily Living   ADL Screening (condition at time of admission) Independently performs ADLs?: No Does the patient have a NEW difficulty with bathing/dressing/toileting/self-feeding that is expected to last >3 days?: No Does the patient have a NEW difficulty with getting in/out of bed, walking, or climbing stairs that is expected to last >3 days?: Yes (Initiates electronic notice to provider for possible PT consult) Does the patient have a NEW difficulty with communication that is expected to last >3 days?: No Is the patient deaf or have difficulty  hearing?: No Does the patient have difficulty seeing, even when wearing glasses/contacts?: No Does the patient have difficulty concentrating, remembering, or making decisions?: No  Permission Sought/Granted Permission sought to share information with : Family Supports, Oceanographer granted to share information with : No (Contact information on chart)  Share Information with NAME: [name] Reininger  Permission granted to share info w AGENCY: SNF  Permission granted to share info w Relationship: Son  Permission granted to share info w Contact Information: [phone]  Emotional Assessment Appearance:: Appears stated age       Alcohol / Substance Use: Not Applicable Psych Involvement: No (comment)  Admission diagnosis:  Hypokalemia [E87.6] Colitis [K52.9] AKI (acute kidney injury) (HCC) [N17.9] Patient Active Problem List   Diagnosis Date Noted   Paroxysmal atrial flutter (HCC) 07/16/2023   Atrial flutter with rapid ventricular response (HCC) 07/15/2023   Hx of CABG 07/15/2023   Colitis 07/14/2023   Prolonged Q-T interval on ECG 07/14/2023   Transient hypotension 07/14/2023   Leukocytosis 07/14/2023   AKI (acute kidney injury) (HCC) 07/14/2023   Diastolic dysfunction 07/14/2023   Hypothyroidism 07/14/2023   Generalized weakness 07/14/2023   Acute respiratory failure with hypoxia (HCC) 07/14/2023   Cholelithiasis 07/14/2023   Respiratory distress 02/01/2018   RSV (respiratory syncytial virus infection) 02/01/2018   Hypokalemia 02/01/2018   URI (upper respiratory infection) 01/31/2018   Reactive airway disease 01/31/2018   Shortness of  breath 01/17/2017   Essential hypertension 12/31/2016   Hyperlipidemia 12/31/2016   GASTRITIS 07/28/2009   DYSPHONIA, SPASTIC 07/28/2009   Diarrhea 07/28/2009   Coronary atherosclerosis 05/06/2007   Other diseases of larynx 05/06/2007   GERD 05/06/2007   DYSPEPSIA 05/06/2007   DIVERTICULOSIS, MILD 05/06/2007    Heartburn 05/06/2007   PCP:  Aldo Hun, MD Pharmacy:   [id number] Jonette Nestle, [place] - [address] AT [facility] [address] [place] [address] Phone: [phone] Fax: [phone]  [facility] [address] [place] [address] Phone: [phone] Fax: [phone]     Social Drivers of Health (SDOH) Social History: SDOH Screenings   Food Insecurity: No Food Insecurity (07/15/2023)  Housing: High Risk (07/15/2023)  Transportation Needs: No Transportation Needs (07/15/2023)  Utilities: Not At Risk (07/15/2023)  Social Connections: Socially Isolated (07/15/2023)  Tobacco Use: Low Risk  (07/14/2023)   SDOH Interventions: Housing Interventions: Patient Unable to Answer Social Connections Interventions: Patient Unable to Answer   Readmission Risk Interventions     No data to display

## 2023-07-16 NOTE — Progress Notes (Signed)
 Palliative:  HPI: 89 y.o. female  with past medical history of dementia, hypertension, HFpEF, CAD, HLD, hypothyroidism, CKD stage 3a, anxiety, depression, GERD admitted on 07/14/2023 from home with loss of appetite, watery diarrhea, abdominal cramps with colitis +C. diff. Recent antibiotic use for mouth infection. Issues with SVT in ED.    I met today with [name] and son, Wendy Wiley, at bedside. Ms. Cam is up in recliner. She is very sleepy but does awaken with ease. Knows her family but overall confusion and level of confusion fluctuates. She tells me I feel better. HR is stable in 60s but she does have episodes of bradycardia down to 30 bpm - she was still able to awaken and overall clinical assessment was unchanged during bradycardic episodes. Discussed with RN with recommendations to hold on additional metoprolol  for now - RN discussing with attending.   Discussed ongoing goals of care with [facility]. We reviewed status, labs, and overall progress. She is trending in the right direction. Hopes that she continues to progress. She has tolerated some small amounts of clear liquids well but overall poor intake. I discussed with Wendy Wiley the potential next steps for SNF rehab. He is thinking ahead about potential need for memory care placement. We also discussed concern for potential failure to thrive following acute illness in the setting of her dementia - only time will tell how well she will do. We also discussed the upcoming decision of anticoagulation. We reviewed risk of stroke with risk of bleeding which is also more of a concern given advanced age and dementia. Wendy Wiley will continue to consider knowing this decision will need to be made.   All questions/concerns addressed. Emotional support provided.   Exam: Sleeping but awakens with ease. Oriented to person, place, and family at bedside. Poor recall but knows she is doing better than she was. Breathing regular, unlabored. HR with episodes of bradycardia.  Abd with some slight distention but soft, non-tender. Warm to touch.   Plan: - DNR/DNI - Continue medical management - Avoid invasive measures that are likely to cause pain/discomfort and likely to prolong her suffering in the event of further decline - Family open to consideration of comfort if further decline and concern for suffering - Working towards SNF rehab  50 min  [name], NP Palliative Medicine Team Pager [phone] (Please see [email or web address] for schedule) Team Phone [phone]

## 2023-07-16 NOTE — NC FL2 (Signed)
 [place]  MEDICAID FL2 LEVEL OF CARE FORM     IDENTIFICATION  Patient Name: Wendy Wiley Birthdate: Jun 23, 1922 Sex: female Admission Date (Current Location): 07/14/2023  [facility] and [place] Number:  Producer, television/film/video and Address:  [facility] [place], [address] [place], [place] [address]      Provider Number: [id number]  Attending Physician Name and Address:  Clancy Crimes, MD  Relative Name and Phone Number:  Seville Brick; Son; [phone]    Current Level of Care: Hospital Recommended Level of Care: Skilled Nursing Facility Prior Approval Number:    Date Approved/Denied:   PASRR Number: [id number]  Discharge Plan: SNF    Current Diagnoses: Patient Active Problem List   Diagnosis Date Noted   Paroxysmal atrial flutter (HCC) 07/16/2023   Atrial flutter with rapid ventricular response (HCC) 07/15/2023   Hx of CABG 07/15/2023   Colitis 07/14/2023   Prolonged Q-T interval on ECG 07/14/2023   Transient hypotension 07/14/2023   Leukocytosis 07/14/2023   AKI (acute kidney injury) (HCC) 07/14/2023   Diastolic dysfunction 07/14/2023   Hypothyroidism 07/14/2023   Generalized weakness 07/14/2023   Acute respiratory failure with hypoxia (HCC) 07/14/2023   Cholelithiasis 07/14/2023   Respiratory distress 02/01/2018   RSV (respiratory syncytial virus infection) 02/01/2018   Hypokalemia 02/01/2018   URI (upper respiratory infection) 01/31/2018   Reactive airway disease 01/31/2018   Shortness of breath 01/17/2017   Essential hypertension 12/31/2016   Hyperlipidemia 12/31/2016   GASTRITIS 07/28/2009   DYSPHONIA, SPASTIC 07/28/2009   Diarrhea 07/28/2009   Coronary atherosclerosis 05/06/2007   Other diseases of larynx 05/06/2007   GERD 05/06/2007   DYSPEPSIA 05/06/2007   DIVERTICULOSIS, MILD 05/06/2007   Heartburn 05/06/2007    Orientation RESPIRATION BLADDER Height & Weight     Self, Time, Place  Normal (Room Air) Continent Weight: 109  lb 5.6 oz (49.6 kg) Height:  4' 10 (147.3 cm)  BEHAVIORAL SYMPTOMS/MOOD NEUROLOGICAL BOWEL NUTRITION STATUS      Incontinent Diet (Please see discharge summary)  AMBULATORY STATUS COMMUNICATION OF NEEDS Skin   Extensive Assist Verbally Normal                       Personal Care Assistance Level of Assistance  Bathing, Dressing, Feeding Bathing Assistance: Maximum assistance Feeding assistance: Maximum assistance Dressing Assistance: Maximum assistance     Functional Limitations Info             SPECIAL CARE FACTORS FREQUENCY  PT (By licensed PT), OT (By licensed OT)     PT Frequency: 5x OT Frequency: 5x            Contractures Contractures Info: Not present    Additional Factors Info  Code Status, Allergies, Isolation Precautions Code Status Info: DNR-LIMITED -Do Not Intubate/DNI Allergies Info: Codeine; Ciprofloxacin; Penicillins; Quinine     Isolation Precautions Info: Enteric Precautions     Current Medications (07/16/2023):  This is the current hospital active medication list Current Facility-Administered Medications  Medication Dose Route Frequency Provider Last Rate Last Admin   acetaminophen  (TYLENOL ) tablet 650 mg  650 mg Oral Q6H PRN Smith, Rondell A, MD       Or   acetaminophen  (TYLENOL ) suppository 650 mg  650 mg Rectal Q6H PRN Lena Qualia, MD       aspirin  EC tablet 81 mg  81 mg Oral QHS Smith, Rondell A, MD   81 mg at 07/15/23 2218   cyanocobalamin  (VITAMIN B12) tablet  1,000 mcg  1,000 mcg Oral Daily Felipe Horton, Rondell A, MD   1,000 mcg at 07/16/23 1108   famotidine (PEPCID) tablet 20 mg  20 mg Oral BID Smith, Rondell A, MD   20 mg at 07/16/23 1107   fluticasone  furoate-vilanterol (BREO ELLIPTA) 200-25 MCG/ACT 1 puff  1 puff Inhalation Daily Smith, Rondell A, MD   1 puff at 07/15/23 0818   heparin ADULT infusion 100 units/mL (25000 units/250mL)  750 Units/hr Intravenous Continuous Smith, Rondell A, MD 7.5 mL/hr at 07/15/23 0218 750 Units/hr at  07/15/23 8295   levalbuterol  (XOPENEX ) nebulizer solution 0.63 mg  0.63 mg Nebulization Q6H PRN Smith, Rondell A, MD       levothyroxine  (SYNTHROID ) tablet 200 mcg  200 mcg Oral QAC breakfast Smith, Rondell A, MD   200 mcg at 07/16/23 1233   magnesium sulfate IVPB 2 g 50 mL  2 g Intravenous Once Rashid, Farhan, MD 50 mL/hr at 07/16/23 1242 2 g at 07/16/23 1242   metoprolol  tartrate (LOPRESSOR ) tablet 25 mg  25 mg Oral BID Rashid, Farhan, MD       rosuvastatin (CRESTOR) tablet 20 mg  20 mg Oral Daily Smith, Rondell A, MD   20 mg at 07/16/23 1108   saccharomyces boulardii (FLORASTOR) capsule 250 mg  250 mg Oral BID Smith, Rondell A, MD   250 mg at 07/16/23 1108   sodium chloride  flush (NS) 0.9 % injection 3 mL  3 mL Intravenous Q12H Smith, Rondell A, MD   3 mL at 07/16/23 1113   trimethobenzamide (TIGAN) injection 200 mg  200 mg Intramuscular Q6H PRN Smith, Rondell A, MD       vancomycin (VANCOCIN) 50 mg/mL oral solution SOLN 125 mg  125 mg Oral Q8H Albino Alu, [facility]         Discharge Medications: Please see discharge summary for a list of discharge medications.  Relevant Imaging Results:  Relevant Lab Results:   Additional Information SS# [id number]  Juliane Och, LCSW

## 2023-07-16 NOTE — Plan of Care (Signed)
   Problem: Education: Goal: Knowledge of General Education information will improve Description: Including pain rating scale, medication(s)/side effects and non-pharmacologic comfort measures Outcome: Progressing   Problem: Clinical Measurements: Goal: Cardiovascular complication will be avoided Outcome: Progressing

## 2023-07-16 NOTE — Progress Notes (Signed)
 Progress Note  Patient Name: Wendy Wiley Date of Encounter: 07/16/2023 [facility] Cardiologist: Olinda Bertrand, DO   Interval Summary   Denies anginal chest pain or heart failure symptoms Converted to sinus rhythm around 8 AM this morning Remains on amiodarone and heparin drip Tolerated oral Lopressor  yesterday night  Vital Signs Vitals:   07/15/23 1929 07/15/23 2317 07/16/23 0700 07/16/23 0832  BP: (!) 100/51 129/60 (!) 137/53 (!) 131/54  Pulse: 79 77 68 65  Resp: 20 20 20    Temp:  98 F (36.7 C) (!) 97.4 F (36.3 C) (!) 97.2 F (36.2 C)  TempSrc:  Oral Oral Axillary  SpO2:  92% 95%   Weight:      Height:       No intake or output data in the 24 hours ending 07/16/23 1130    07/15/2023    2:59 PM 07/14/2023   11:36 AM 10/09/2018    9:48 AM  Last 3 Weights  Weight (lbs) 109 lb 5.6 oz 140 lb 121 lb  Weight (kg) 49.6 kg 63.504 kg 54.885 kg      Telemetry/ECG  Atrial flutter with RVR, transitioned to sinus rhythm around 8 AM- Personally Reviewed       Physical Exam  GEN: No acute distress.  Hemodynamically stable, pleasantly demented, Neck: No JVD Cardiac: Regular, positive S1-S2, soft systolic murmur, no gallops or rubs  Respiratory: Clear to auscultation bilaterally. GI: Soft, nontender, non-distended  MS: No edema  Assessment & Plan  Impression:  New onset of atrial flutter with rapid ventricular rate /paroxysmal atrial flutter Coronary artery disease status post CABG. Elevated troponins likely demand ischemia. Prolonged QT Acute C. difficile colitis/dehydration Acute kidney injury on chronic kidney disease stage III. Hypokalemia, Hypothyroidism,?  Iatrogenic hyperthyroidism   Recommendations:  New onset of atrial flutter with rapid ventricular rate Paroxysmal atrial flutter Converted to sinus rhythm earlier this morning. Had episodes of intermittent sinus rhythm yesterday as well. May still have intermittent episodes of atrial flutter as  she has underlying colitis secondary to C. difficile, dehydration, and electrolyte abnormalities and thyroid  disease Continue telemetry Has responded well to Lopressor  12.5 mg p.o. 3 times daily, will transition her to 25 mg twice daily with holding parameters Will discontinue amiodarone for now as her ventricular rates have improved especially in the setting of underlying thyroid  disease. Echocardiogram performed earlier this morning notes hyperdynamic LVEF, therefore if additional rate control medications are needed may consider IV Cardizem drip Continue IV heparin drip for now transition to oral anticoagulation if family agreeable given her CHA2DS2-VASc SCORE. May benefit from at least 4 weeks of therapy if not long-term given the concerns for underlying dementia and fall risk. Patient's son (next of kin) will need to be a part of shared decision making process to line medical therapy to her goals of care knowing the risks, benefits, and alternatives to anticoagulation -can be done closer to discharge. Defer management of dehydration, C. difficile colitis, electrode abnormalities to primary team. Very hopeful that she will restore normal sinus rhythm once her underlying active problem list start improving/resolving. Will follow for now. Continue telemetry Overall family leaning towards conservative management - reasonable.    Coronary artery disease status post CABG. Elevated troponins likely demand ischemia. Denies anginal chest pain prior to arrival to the ED.  No active discomfort. Troponin leak likely secondary to the above EKG is nonischemic Echocardiogram -performed, final read pending.  Brief review of the images notes LVEF is hyperdynamic  Prolong QT:  Likely secondary to electrolyte abnormalities, potassium this morning 2.7. Will discontinue IV amiodarone Avoid QT prolonging medications, discussed with pharmacy prior to medication initiation if any concerns   Acute C. difficile  colitis/dehydration/ Currently on antibiotics, followed by primary team   Acute kidney injury on chronic kidney disease stage III: Recommend hydration, defer to primary team   Hypokalemia: Potassium again is 2.7 -management per primary team  Hypothyroidism,?  Iatrogenic hyperthyroidism: Consider reducing Synthroid  dose, final decision to primary team   For questions or updates, please contact [facility] Please consult [email or web address] for contact info under      Signed, Awilda Bogus, [facility]  A Division of [facility] [address] [place], [place] [address]  [place], [place] [address] 11:30 AM 07/16/23

## 2023-07-16 NOTE — Evaluation (Signed)
 Physical Therapy Evaluation Patient Details Name: Wendy Wiley MRN: [id number] DOB: 10-Mar-1922 Today's Date: 07/16/2023  History of Present Illness  Patient is 89 y.o. female  who presents 07/14/23 with complaints of worsening diarrhea. Pt with C-diff and a-flutter with RVR.  PMH significant for HTN, hypothyroidism, anxiety, depression, dementia, and GERD.  Clinical Impression  Pt admitted with above diagnosis and presents to PT with functional limitations due to deficits listed below (See PT problem list). Pt needs skilled PT to maximize independence and safety. Prior to illness pt has been modified independent at home. Now is weak and requiring assist. Expect slow steady progress if medical status improves. Patient will benefit from continued inpatient follow up therapy, <3 hours/day.           If plan is discharge home, recommend the following: A lot of help with walking and/or transfers;A lot of help with bathing/dressing/bathroom;Assist for transportation;Help with stairs or ramp for entrance;Assistance with cooking/housework   Can travel by private vehicle   No    Equipment Recommendations Wheelchair (measurements PT);Wheelchair cushion (measurements PT)  Recommendations for Other Services       Functional Status Assessment Patient has had a recent decline in their functional status and demonstrates the ability to make significant improvements in function in a reasonable and predictable amount of time.     Precautions / Restrictions Precautions Precautions: Fall;Other (comment) Recall of Precautions/Restrictions: Impaired Precaution/Restrictions Comments: c-diff Restrictions Weight Bearing Restrictions Per Provider Order: No      Mobility  Bed Mobility Overal bed mobility: Needs Assistance Bed Mobility: Supine to Sit     Supine to sit: Mod assist, HOB elevated     General bed mobility comments: aSsist to bring legs off of bed, elevate trunk and scoot hips to EOB     Transfers Overall transfer level: Needs assistance Equipment used: Rollator (4 wheels) Transfers: Sit to/from Stand, Bed to chair/wheelchair/BSC Sit to Stand: Mod assist, +2 safety/equipment   Step pivot transfers: Mod assist, +2 safety/equipment       General transfer comment: Assist to power up. Assist to take small pivotal steps to chair    Ambulation/Gait                  Stairs            Wheelchair Mobility     Tilt Bed    Modified Rankin (Stroke Patients Only)       Balance Overall balance assessment: Needs assistance Sitting-balance support: Bilateral upper extremity supported, Feet unsupported Sitting balance-Leahy Scale: Poor   Postural control: Posterior lean Standing balance support: Bilateral upper extremity supported, During functional activity, Reliant on assistive device for balance Standing balance-Leahy Scale: Poor Standing balance comment: rollator and mod assist for static standing                             Pertinent Vitals/Pain Pain Assessment Pain Assessment: No/denies pain    Home Living Family/patient expects to be discharged to:: Private residence Living Arrangements: Children Available Help at Discharge: Family;Available 24 hours/day Type of Home: House Home Access: Ramped entrance       Home Layout: One level Home Equipment: Educational psychologist (4 wheels)      Prior Function Prior Level of Function : Independent/Modified Independent             Mobility Comments: Pt modified independent with rollator ADLs Comments: Pt modified independent with bathing and dressing  Extremity/Trunk Assessment   Upper Extremity Assessment Upper Extremity Assessment: Generalized weakness    Lower Extremity Assessment Lower Extremity Assessment: Generalized weakness       Communication   Communication Communication: Impaired Factors Affecting Communication: Hearing impaired    Cognition  Arousal: Alert Behavior During Therapy: WFL for tasks assessed/performed   PT - Cognitive impairments: History of cognitive impairments                         Following commands: Impaired Following commands impaired: Follows one step commands with increased time     Cueing Cueing Techniques: Verbal cues, Tactile cues, Gestural cues     General Comments General comments (skin integrity, edema, etc.): VSS on RA    Exercises     Assessment/Plan    PT Assessment Patient needs continued PT services  PT Problem List Decreased strength;Decreased activity tolerance;Decreased balance;Decreased mobility       PT Treatment Interventions DME instruction;Gait training;Functional mobility training;Therapeutic activities;Therapeutic exercise;Balance training;Patient/family education    PT Goals (Current goals can be found in the Care Plan section)  Acute Rehab PT Goals Patient Stated Goal: not stated PT Goal Formulation: With patient/family Time For Goal Achievement: 07/30/23 Potential to Achieve Goals: Fair    Frequency Min 2X/week     Co-evaluation               AM-PAC PT 6 Clicks Mobility  Outcome Measure Help needed turning from your back to your side while in a flat bed without using bedrails?: A Lot Help needed moving from lying on your back to sitting on the side of a flat bed without using bedrails?: A Lot Help needed moving to and from a bed to a chair (including a wheelchair)?: A Lot Help needed standing up from a chair using your arms (e.g., wheelchair or bedside chair)?: A Lot Help needed to walk in hospital room?: Total Help needed climbing 3-5 steps with a railing? : Total 6 Click Score: 10    End of Session Equipment Utilized During Treatment: Gait belt Activity Tolerance: Patient tolerated treatment well Patient left: in chair;with call bell/phone within reach;with chair alarm set;with family/visitor present Nurse Communication: Mobility  status PT Visit Diagnosis: Unsteadiness on feet (R26.81);Other abnormalities of gait and mobility (R26.89);Muscle weakness (generalized) (M62.81)    Time: 4332-9518 PT Time Calculation (min) (ACUTE ONLY): 37 min   Charges:   PT Evaluation $PT Eval Moderate Complexity: 1 Mod PT Treatments $Therapeutic Activity: 8-22 mins PT General Charges $$ ACUTE PT VISIT: 1 Visit         [facility] PT Acute Rehabilitation Services Office [phone]   Pura Browns [facility] 07/16/2023, 12:01 PM

## 2023-07-16 NOTE — Progress Notes (Signed)
 PROGRESS NOTE    Wendy Wiley  GNF:[id number] DOB: March 26, 1922 DOA: 07/14/2023 PCP: Aldo Hun, MD   Brief Narrative:  89 y.o. female with medical history significant of hypertension, hypothyroidism, anxiety, depression, dementia, and GERD who presents with complaints of worsening diarrhea.  Found to have c.diff colitis.  Subsequent Afib with RVR, off of amiodarone drip now. Cards on board.    Assessment & Plan:  Principal Problem:   Colitis Active Problems:   Transient hypotension   Leukocytosis   Acute respiratory failure with hypoxia (HCC)   AKI (acute kidney injury) (HCC)   Prolonged Q-T interval on ECG   Hypokalemia   Diastolic dysfunction   Coronary atherosclerosis   Hypothyroidism   Hyperlipidemia   GERD   Cholelithiasis   Generalized weakness   Atrial flutter with rapid ventricular response (HCC)   Hx of CABG   Paroxysmal atrial flutter (HCC)    Acute C. difficile colitis - Profuse watery diarrhea after antibiotic use. C. difficile positive with positive toxin PCR.  Placed on p.o. vancomycin 4 times daily x 10 days.   Contact precautions. Diarrhea has resolved now.   Atrial flutter with RVR :Converted to sinus on the morning of 6/18. -No known history of a flutter/fib.  Occurred morning 6/16, rates in the 130s with systolic blood pressure in the 90s.  IV fluid bolus given, attempted 6 mg IV adenosine, metoprolol  IV with minimal effect.  Subsequently discussed with cardiology to start amiodarone infusion and heparin drip.   Amiodarone drip dced on 6/18. Metoprolol  25 mg BID On heparin drip for stroke prophylaxis. She will need to be started on oral anticoagulation close to her discharge. Her CHADsVASC score is high. Will discuss with her and her son.   Acute kidney injury on CKD 3 A: - Creatinine markedly elevated on presentation, 3.6.  Showing improvement after IV fluid hydration.  Likely prerenal etiology given patient's diffuse diarrhea.  Continue aggressive  IV fluid hydration.  Monitor urine output and BMP and magnesium in a.m.   Elevated troponin/type 2 MI: - Likely demand ischemia given SVT/A-fib RVR. Cardiology on board.   hypokalemia - Replenishment on board.  Will recheck BMP and magnesium in AM.   Goals of care - 89 year old female with multiple comorbidities, presenting with worsening renal function, C. difficile, SVT and hypotension.  Patient is DNR.  Palliative care following.  Do not want advanced measures such as ICU level intubation, vasopressor support however are okay with ongoing treatment regimen.  Prepared to pursue comfort care patient decompensates.   CAD s/p CABG: - Aspirin , statin   Hypothyroidism - TSH mildly low with mildly elevated T4.  May be contributing to patient's A-flutter however will more likely dehydration.  Continue Synthroid  for now.  Patient would need to reevaluate TSH and Synthroid  dosing in the outpatient setting.  DVT prophylaxis: on Heparin drip     Code Status: Limited: Do not attempt resuscitation (DNR) -DNR-LIMITED -Do Not Intubate/DNI  Family Communication:   Status is: Inpatient Remains inpatient appropriate because: Afib, C.diff.    Subjective:  Feels better. Denies diarrhea or abdominal pain. Son present at the bedside.  Examination:  General exam: Appears calm and comfortable  Respiratory system: Clear to auscultation. Respiratory effort normal. Cardiovascular system: S1 & S2 heard, RRR. No JVD, murmurs, rubs, gallops or clicks. No pedal edema. Gastrointestinal system: Abdomen is nondistended, soft and nontender. No organomegaly or masses felt. Normal bowel sounds heard. Central nervous system: Alert and oriented. No focal neurological deficits. Extremities:  Symmetric 5 x 5 power. Skin: No rashes, lesions or ulcers Psychiatry: Judgement and insight appear normal. Mood & affect appropriate.    Diet Orders (From admission, onward)     Start     Ordered   07/14/23 1542  Diet  clear liquid Room service appropriate? Yes; Fluid consistency: Thin  Diet effective now       Question Answer Comment  Room service appropriate? Yes   Fluid consistency: Thin      07/14/23 1543            Objective: Vitals:   07/15/23 2317 07/16/23 0700 07/16/23 0832 07/16/23 1224  BP: 129/60 (!) 137/53 (!) 131/54 (!) 108/54  Pulse: 77 68 65 67  Resp: 20 20  19   Temp: 98 F (36.7 C) (!) 97.4 F (36.3 C) (!) 97.2 F (36.2 C) 97.8 F (36.6 C)  TempSrc: Oral Oral Axillary Oral  SpO2: 92% 95%  93%  Weight:      Height:        Intake/Output Summary (Last 24 hours) at 07/16/2023 1607 Last data filed at 07/16/2023 1225 Gross per 24 hour  Intake 120 ml  Output --  Net 120 ml   Filed Weights   07/14/23 1136 07/15/23 1459  Weight: 63.5 kg 49.6 kg    Scheduled Meds:  aspirin  EC  81 mg Oral QHS   cyanocobalamin   1,000 mcg Oral Daily   famotidine  20 mg Oral BID   fluticasone  furoate-vilanterol  1 puff Inhalation Daily   levothyroxine   200 mcg Oral QAC breakfast   rosuvastatin  20 mg Oral Daily   saccharomyces boulardii  250 mg Oral BID   sodium chloride  flush  3 mL Intravenous Q12H   vancomycin  125 mg Oral Q8H   Continuous Infusions:  heparin 750 Units/hr (07/16/23 1537)    Nutritional status     Body mass index is 22.85 kg/m.  Data Reviewed:   CBC: Recent Labs  Lab 07/14/23 1139 07/15/23 0448 07/16/23 0235  WBC 35.7* 27.7* 23.0*  NEUTROABS 31.9*  --   --   HGB 11.9* 11.4* 11.1*  HCT 36.0 34.9* 34.2*  MCV 82.2 82.9 81.8  PLT 254 246 243   Basic Metabolic Panel: Recent Labs  Lab 07/14/23 1139 07/14/23 1432 07/14/23 2221 07/15/23 0448 07/16/23 0235  NA 135  --  136 136 137  K <2.0*  --  2.8* 3.1* 2.7*  CL 92*  --  98 101 104  CO2 25  --  23 21* 23  GLUCOSE 93  --  98 114* 109*  BUN 46*  --  41* 36* 30*  CREATININE 3.60*  --  3.10* 2.71* 2.40*  CALCIUM 7.3*  --  7.0* 6.9* 7.3*  MG  --  2.2 2.0  --  1.6*   GFR: Estimated Creatinine  Clearance: 9.2 mL/min (A) (by C-G formula based on SCr of 2.4 mg/dL (H)). Liver Function Tests: Recent Labs  Lab 07/14/23 1139 07/14/23 2221 07/15/23 0448  AST 17 19 21   ALT 9 9 10   ALKPHOS 85 75 78  BILITOT 1.7* 1.0 0.6  PROT 5.4* 4.9* 4.8*  ALBUMIN 1.9* 1.7* 1.6*   No results for input(s): LIPASE, AMYLASE in the last 168 hours. No results for input(s): AMMONIA in the last 168 hours. Coagulation Profile: Recent Labs  Lab 07/14/23 1139  INR 1.2   Cardiac Enzymes: No results for input(s): CKTOTAL, CKMB, CKMBINDEX, TROPONINI in the last 168 hours. BNP (last 3 results) No results  for input(s): PROBNP in the last 8760 hours. HbA1C: No results for input(s): HGBA1C in the last 72 hours. CBG: No results for input(s): GLUCAP in the last 168 hours. Lipid Profile: No results for input(s): CHOL, HDL, LDLCALC, TRIG, CHOLHDL, LDLDIRECT in the last 72 hours. Thyroid  Function Tests: Recent Labs    07/14/23 1432 07/15/23 0448  TSH 0.233*  --   FREET4  --  1.85*   Anemia Panel: No results for input(s): VITAMINB12, FOLATE, FERRITIN, TIBC, IRON, RETICCTPCT in the last 72 hours. Sepsis Labs: Recent Labs  Lab 07/14/23 1231  LATICACIDVEN 1.0    Recent Results (from the past 240 hours)  Blood Culture (routine x 2)     Status: None (Preliminary result)   Collection Time: 07/14/23 11:39 AM   Specimen: BLOOD  Result Value Ref Range Status   Specimen Description BLOOD SITE NOT SPECIFIED  Final   Special Requests   Final    BOTTLES DRAWN AEROBIC AND ANAEROBIC Blood Culture adequate volume   Culture   Final    NO GROWTH 2 DAYS Performed at [facility], [address], [place], [place] [address]    Report Status PENDING  Incomplete  Blood Culture (routine x 2)     Status: None (Preliminary result)   Collection Time: 07/14/23 11:44 AM   Specimen: BLOOD RIGHT HAND  Result Value Ref Range Status   Specimen Description BLOOD RIGHT  HAND  Final   Special Requests   Final    BOTTLES DRAWN AEROBIC ONLY Blood Culture adequate volume   Culture   Final    NO GROWTH 2 DAYS Performed at [facility], [address] [place], [place] [address]    Report Status PENDING  Incomplete  C Difficile Quick Screen w PCR reflex     Status: Abnormal   Collection Time: 07/15/23  1:05 AM   Specimen: STOOL  Result Value Ref Range Status   C Diff antigen POSITIVE (A) NEGATIVE Final   C Diff toxin POSITIVE (A) NEGATIVE Final   C Diff interpretation Toxin producing C. difficile detected.  Final    Comment: RESULTS CALLED TO READ BACK BY AND VERIFIED WITH RN E.TRYER ON 07/15/23 AT 0159 BY NM Performed at [facility], [address] [place], [place] [address]   Gastrointestinal Panel by PCR , Stool     Status: None   Collection Time: 07/15/23  1:06 AM   Specimen: STOOL  Result Value Ref Range Status   Campylobacter species NOT DETECTED NOT DETECTED Final   Plesimonas shigelloides NOT DETECTED NOT DETECTED Final   Salmonella species NOT DETECTED NOT DETECTED Final   Yersinia enterocolitica NOT DETECTED NOT DETECTED Final   Vibrio species NOT DETECTED NOT DETECTED Final   Vibrio cholerae NOT DETECTED NOT DETECTED Final   Enteroaggregative E coli (EAEC) NOT DETECTED NOT DETECTED Final   Enteropathogenic E coli (EPEC) NOT DETECTED NOT DETECTED Final   Enterotoxigenic E coli (ETEC) NOT DETECTED NOT DETECTED Final   Shiga like toxin producing E coli (STEC) NOT DETECTED NOT DETECTED Final   Shigella/Enteroinvasive E coli (EIEC) NOT DETECTED NOT DETECTED Final   Cryptosporidium NOT DETECTED NOT DETECTED Final   Cyclospora cayetanensis NOT DETECTED NOT DETECTED Final   Entamoeba histolytica NOT DETECTED NOT DETECTED Final   Giardia lamblia NOT DETECTED NOT DETECTED Final   Adenovirus F40/41 NOT DETECTED NOT DETECTED Final   Astrovirus NOT DETECTED NOT DETECTED Final   Norovirus GI/GII NOT DETECTED NOT DETECTED  Final   Rotavirus  A NOT DETECTED NOT DETECTED Final   Sapovirus (I, II, IV, and V) NOT DETECTED NOT DETECTED Final    Comment: Performed at [facility], [address] [place], [place] [address]  MRSA Next Gen by PCR, Nasal     Status: None   Collection Time: 07/15/23  3:04 PM   Specimen: Nasal Mucosa; Nasal Swab  Result Value Ref Range Status   MRSA by PCR Next Gen NOT DETECTED NOT DETECTED Final    Comment: (NOTE) The GeneXpert MRSA Assay (FDA approved for NASAL specimens only), is one component of a comprehensive MRSA colonization surveillance program. It is not intended to diagnose MRSA infection nor to guide or monitor treatment for MRSA infections. Test performance is not FDA approved in patients less than 34 years old. Performed at [facility], [address] [place], [place] [address]          Radiology Studies: ECHOCARDIOGRAM COMPLETE Result Date: 07/16/2023    ECHOCARDIOGRAM REPORT   Patient Name:   FRANCA STAKES Date of Exam: 07/16/2023 Medical Rec #:  [id number]     Height:       58.0 in Accession #:    [id number]    Weight:       109.3 lb Date of Birth:  [birth year]    BSA:          1.409 m Patient Age:    89 years      BP:           131/54 mmHg Patient Gender: F             HR:           65 bpm. Exam Location:  Inpatient Procedure: 2D Echo, Color Doppler and Cardiac Doppler (Both Spectral and Color            Flow Doppler were utilized during procedure). Indications:    Atrial fibrillation  History:        Patient has prior history of Echocardiogram examinations, most                 recent 01/08/2017.  Sonographer:    Janette Medley Referring Phys: [id number] Debria Fang IMPRESSIONS  1. Left ventricular ejection fraction, by estimation, is 70 to 75%. Left ventricular ejection fraction by PLAX is 74 %. The left ventricle has hyperdynamic function. The left ventricle has no regional wall motion abnormalities. Left ventricular diastolic function could not be evaluated.  2.  Right ventricular systolic function is mildly reduced. The right ventricular size is normal. The estimated right ventricular systolic pressure is 23.6 mmHg.  3. The mitral valve is degenerative. Trivial mitral valve regurgitation.  4. The tricuspid valve is abnormal. Tricuspid valve regurgitation is moderate.  5. The aortic valve is tricuspid. Aortic valve regurgitation is moderate. Aortic valve sclerosis/calcification is present, without any evidence of aortic stenosis.  6. The inferior vena cava is normal in size with greater than 50% respiratory variability, suggesting right atrial pressure of 3 mmHg. Comparison(s): Changes from prior study are noted. 01/08/2017: LVEF 65-70%, trivial AI. FINDINGS  Left Ventricle: Left ventricular ejection fraction, by estimation, is 70 to 75%. Left ventricular ejection fraction by PLAX is 74 %. The left ventricle has hyperdynamic function. The left ventricle has no regional wall motion abnormalities. The left ventricular internal cavity size was normal in size. There is no left ventricular hypertrophy. Left ventricular diastolic function could not be evaluated due to atrial  fibrillation. Left ventricular diastolic function could not be evaluated. Right Ventricle: The right ventricular size is normal. No increase in right ventricular wall thickness. Right ventricular systolic function is mildly reduced. The tricuspid regurgitant velocity is 2.27 m/s, and with an assumed right atrial pressure of 3 mmHg, the estimated right ventricular systolic pressure is 23.6 mmHg. Left Atrium: Left atrial size was normal in size. Right Atrium: Right atrial size was normal in size. Pericardium: There is no evidence of pericardial effusion. Mitral Valve: The mitral valve is degenerative in appearance. Mild mitral annular calcification. Trivial mitral valve regurgitation. Tricuspid Valve: The tricuspid valve is abnormal. Tricuspid valve regurgitation is moderate. Aortic Valve: The aortic valve is  tricuspid. Aortic valve regurgitation is moderate. Aortic valve sclerosis/calcification is present, without any evidence of aortic stenosis. Pulmonic Valve: The pulmonic valve was grossly normal. Pulmonic valve regurgitation is not visualized. Aorta: The aortic root and ascending aorta are structurally normal, with no evidence of dilitation. Venous: The inferior vena cava is normal in size with greater than 50% respiratory variability, suggesting right atrial pressure of 3 mmHg. IAS/Shunts: No atrial level shunt detected by color flow Doppler.  LEFT VENTRICLE PLAX 2D LV EF:         Left            Diastology                ventricular     LV e' medial:    6.42 cm/s                ejection        LV E/e' medial:  17.6                fraction by     LV e' lateral:   7.51 cm/s                PLAX is 74      LV E/e' lateral: 15.0                %. LVIDd:         3.60 cm LVIDs:         2.10 cm LV PW:         1.00 cm LV IVS:        0.90 cm LVOT diam:     1.60 cm LV SV:         47 LV SV Index:   33 LVOT Area:     2.01 cm  RIGHT VENTRICLE            IVC RV S prime:     9.90 cm/s  IVC diam: 1.20 cm TAPSE (M-mode): 1.6 cm LEFT ATRIUM             Index        RIGHT ATRIUM          Index LA diam:        3.30 cm 2.34 cm/m   RA Area:     9.98 cm LA Vol (A2C):   26.3 ml 18.67 ml/m  RA Volume:   17.30 ml 12.28 ml/m LA Vol (A4C):   22.9 ml 16.25 ml/m LA Biplane Vol: 25.3 ml 17.96 ml/m  AORTIC VALVE LVOT Vmax:   110.00 cm/s LVOT Vmean:  69.700 cm/s LVOT VTI:    0.232 m  AORTA Ao Root diam: 2.40 cm Ao Asc diam:  2.70 cm MITRAL VALVE  TRICUSPID VALVE MV Area (PHT): 4.57 cm     TR Peak grad:   20.6 mmHg MV Decel Time: 166 msec     TR Vmax:        227.00 cm/s MV E velocity: 113.00 cm/s MV A velocity: 105.00 cm/s  SHUNTS MV E/A ratio:  1.08         Systemic VTI:  0.23 m                             Systemic Diam: 1.60 cm Dinah Franco MD Electronically signed by Dinah Franco MD Signature Date/Time: 07/16/2023/1:05:31  PM    Final    US  Abdomen Limited RUQ (LIVER/GB) Result Date: 07/14/2023 CLINICAL DATA:  Abdominal pain EXAM: ULTRASOUND ABDOMEN LIMITED RIGHT UPPER QUADRANT COMPARISON:  Ultrasound 2009. CT scan without contrast earlier 02/13/2023 FINDINGS: Gallbladder: Dilated gallbladder. Length up to 9.6 cm. No wall thickening or adjacent fluid. No shadowing stones. Common bile duct: Diameter: 3 mm Liver: No focal lesion identified. Within normal limits in parenchymal echogenicity. Portal vein is patent on color Doppler imaging with normal direction of blood flow towards the liver. Other: None. IMPRESSION: Dilated gallbladder. No shadowing stones or ductal dilatation. If there is further concern of the gallbladder additional workup may be useful such as HIDA scan or MRCP as clinically appropriate Electronically Signed   By: Adrianna Horde M.D.   On: 07/14/2023 16:41           LOS: 2 days   Time spent= 35 mins    Clancy Crimes, MD [facility]  If 7PM-7AM, please contact night-coverage  07/16/2023, 4:07 PM

## 2023-07-16 NOTE — Progress Notes (Signed)
 PHARMACY - ANTICOAGULATION CONSULT NOTE  Pharmacy Consult for heparin Indication: atrial fibrillation  Allergies  Allergen Reactions   Codeine Other (See Comments)    Reaction:  Altered mental status    Ciprofloxacin Other (See Comments)    Reaction:  Unknown    Penicillins Other (See Comments)    Reaction type/severity unknown   Quinine Swelling    Patient Measurements: Height: 4' 10 (147.3 cm) Weight: 49.6 kg (109 lb 5.6 oz) IBW/kg (Calculated) : 40.9 HEPARIN DW (KG): 49.6  Vital Signs: Temp: 98 F (36.7 C) (06/17 2317) Temp Source: Oral (06/17 2317) BP: 129/60 (06/17 2317) Pulse Rate: 77 (06/17 2317)  Labs: Recent Labs    07/14/23 1139 07/14/23 2221 07/15/23 0448 07/15/23 0830 07/15/23 1535 07/16/23 0235  HGB 11.9*  --  11.4*  --   --  11.1*  HCT 36.0  --  34.9*  --   --  34.2*  PLT 254  --  246  --   --  243  LABPROT 15.2  --   --   --   --   --   INR 1.2  --   --   --   --   --   HEPARINUNFRC  --   --   --  0.52 0.62 0.53  CREATININE 3.60* 3.10* 2.71*  --   --  2.40*  TROPONINIHS  --   --   --  155* 138*  --     Estimated Creatinine Clearance: 9.2 mL/min (A) (by C-G formula based on SCr of 2.4 mg/dL (H)).  Assessment: 89 y.o. female with new onset atrial fibrillation. Not on anticoagulation PTA. Pharmacy consulted to dose heparin in setting of afib.   Heparin level remains therapeutic (0.53) on 750 units/hr. CBC stable. No signs of bleeding noted.  Goal of Therapy:  Heparin level 0.3-0.7 units/ml Monitor platelets by anticoagulation protocol: Yes   Plan:  Continue heparin at 750 units/hr  Daily heparin level and CBC  Monitor for s/sx of bleeding F/u long term [facility] plans  Albino Alu, PharmD PGY2 Cardiology Pharmacy Resident 07/16/2023,6:16 AM

## 2023-07-17 DIAGNOSIS — F0280 Dementia in other diseases classified elsewhere without behavioral disturbance: Secondary | ICD-10-CM

## 2023-07-17 DIAGNOSIS — G309 Alzheimer's disease, unspecified: Secondary | ICD-10-CM

## 2023-07-17 DIAGNOSIS — I459 Conduction disorder, unspecified: Secondary | ICD-10-CM

## 2023-07-17 DIAGNOSIS — I455 Other specified heart block: Secondary | ICD-10-CM

## 2023-07-17 LAB — BASIC METABOLIC PANEL WITH GFR
Anion gap: 9 (ref 5–15)
Anion gap: 8 (ref 5–15)
BUN: 27 mg/dL — ABNORMAL HIGH (ref 8–23)
BUN: 27 mg/dL — ABNORMAL HIGH (ref 8–23)
CO2: 22 mmol/L (ref 22–32)
CO2: 19 mmol/L — ABNORMAL LOW (ref 22–32)
Calcium: 7.3 mg/dL — ABNORMAL LOW (ref 8.9–10.3)
Calcium: 7.7 mg/dL — ABNORMAL LOW (ref 8.9–10.3)
Chloride: 105 mmol/L (ref 98–111)
Chloride: 109 mmol/L (ref 98–111)
Creatinine, Ser: 2.42 mg/dL — ABNORMAL HIGH (ref 0.44–1.00)
Creatinine, Ser: 2.5 mg/dL — ABNORMAL HIGH (ref 0.44–1.00)
GFR, Estimated: 18 mL/min — ABNORMAL LOW (ref >60)
GFR, Estimated: 17 mL/min — ABNORMAL LOW (ref >60)
Glucose, Bld: 97 mg/dL (ref 70–99)
Glucose, Bld: 107 mg/dL — ABNORMAL HIGH (ref 70–99)
Potassium: 3.5 mmol/L (ref 3.5–5.1)
Potassium: 4.3 mmol/L (ref 3.5–5.1)
Sodium: 136 mmol/L (ref 135–145)
Sodium: 136 mmol/L (ref 135–145)

## 2023-07-17 LAB — CBC WITH DIFFERENTIAL/PLATELET
Abs Immature Granulocytes: 0.43 10*3/uL — ABNORMAL HIGH (ref 0.00–0.07)
Basophils Absolute: 0.1 10*3/uL (ref 0.0–0.1)
Basophils Relative: 1 %
Eosinophils Absolute: 0.7 10*3/uL — ABNORMAL HIGH (ref 0.0–0.5)
Eosinophils Relative: 4 %
HCT: 34.7 % — ABNORMAL LOW (ref 36.0–46.0)
Hemoglobin: 11.4 g/dL — ABNORMAL LOW (ref 12.0–15.0)
Immature Granulocytes: 3 %
Lymphocytes Relative: 13 %
Lymphs Abs: 1.9 10*3/uL (ref 0.7–4.0)
MCH: 26.5 pg (ref 26.0–34.0)
MCHC: 32.9 g/dL (ref 30.0–36.0)
MCV: 80.7 fL (ref 80.0–100.0)
Monocytes Absolute: 1 10*3/uL (ref 0.1–1.0)
Monocytes Relative: 6 %
Neutro Abs: 11.2 10*3/uL — ABNORMAL HIGH (ref 1.7–7.7)
Neutrophils Relative %: 73 %
Platelets: 225 10*3/uL (ref 150–400)
RBC: 4.3 MIL/uL (ref 3.87–5.11)
RDW: 16.9 % — ABNORMAL HIGH (ref 11.5–15.5)
WBC: 15.2 10*3/uL — ABNORMAL HIGH (ref 4.0–10.5)
nRBC: 0 % (ref 0.0–0.2)

## 2023-07-17 LAB — MAGNESIUM
Magnesium: 2.8 mg/dL — ABNORMAL HIGH (ref 1.7–2.4)
Magnesium: 2.7 mg/dL — ABNORMAL HIGH (ref 1.7–2.4)

## 2023-07-17 LAB — HEPARIN LEVEL (UNFRACTIONATED): Heparin Unfractionated: 0.69 [IU]/mL (ref 0.30–0.70)

## 2023-07-17 MED ORDER — POTASSIUM CHLORIDE 20 MEQ PO PACK
40.0000 meq | PACK | Freq: Once | ORAL | Status: AC
  Administered 2023-07-17: 40 meq via ORAL
  Filled 2023-07-17: qty 2

## 2023-07-17 MED ORDER — SODIUM CHLORIDE 0.9 % IV BOLUS
500.0000 mL | Freq: Once | INTRAVENOUS | Status: AC
  Administered 2023-07-17: 500 mL via INTRAVENOUS

## 2023-07-17 MED ORDER — POTASSIUM CHLORIDE CRYS ER 20 MEQ PO TBCR: 40.0000 meq | EXTENDED_RELEASE_TABLET | ORAL | Status: DC

## 2023-07-17 MED ORDER — ACETAMINOPHEN 650 MG RE SUPP: 650.0000 mg | Freq: Four times a day (QID) | RECTAL | Status: DC | PRN

## 2023-07-17 MED ORDER — METOPROLOL TARTRATE 12.5 MG HALF TABLET
12.5000 mg | ORAL_TABLET | Freq: Two times a day (BID) | ORAL | Status: DC
  Filled 2023-07-17: qty 1

## 2023-07-17 MED ORDER — ACETAMINOPHEN 160 MG/5ML PO SOLN
650.0000 mg | Freq: Four times a day (QID) | ORAL | Status: DC | PRN
  Administered 2023-07-17: 650 mg via ORAL
  Filled 2023-07-17: qty 20.3

## 2023-07-17 NOTE — Progress Notes (Signed)
 Clinical Event.   Nursing staff reached out to review her telemetry.  She was called by central telemetry that patient have a prolonged sinus pause/arrest.  By the time she came to evaluate the patient patient was easily arousable.  Son was present at bedside during the event.  Reviewed the telemetry. Around 1544 telemetry noted to have sinus arrest (~15 seconds occurring back to back) with either ventricular escape or junctional escape beats in the interim.  She had junctional rhythm following this and converted to sinus rhythm around 1548.  Speaking to the patient's son who was at bedside. Patient felt cold so he applied blankets. Patient went to sleep. The monitor was blinking and read bradycardia - according to the son. The nurse and the patient called her name out and she was arousable.  Based on clinical trajectory it appears that the patient was asleep when this happened & likely transitioning between underlying atrial flutter to sinus rhythm.  Underlying conduction disease cannot be entirely ruled out.  IV fluids were administered. Recommended checking BMP and magnesium levels as she has been experiencing hypokalemia due to C. difficile colitis. Continue to monitor telemetry Decrease Lopressor  to 12.5 mg p.o. twice daily with holding parameters. If these episodes continue to occur recommend placing transcutaneous pads. Patient's son leaning towards conservative management-please refer to palliative care notes  Care discussed with nurse and son at bedside.   CRITICAL CARE Performed by: Olinda Bertrand   Total critical care time: 37 minutes   Critical care time was exclusive of separately billable procedures and treating other patients.   Critical care was necessary to treat or prevent imminent or life-threatening deterioration -conduction disorder / AFL.   Critical care was time spent personally by me on the following activities: development of treatment plan with patient  and/or surrogate as well as nursing, discussions with family, evaluation of patient's response to treatment, examination of patient, obtaining history from patient or surrogate, ordering treatments and interventions, ordering and review of laboratory studies, pulse oximetry and re-evaluation of patient's condition.  Awilda Bogus, [facility]  A Division of [facility] [address], [place], [place] [address]  [place], [place] [address]

## 2023-07-17 NOTE — Plan of Care (Signed)
   Problem: Education: Goal: Knowledge of General Education information will improve Description: Including pain rating scale, medication(s)/side effects and non-pharmacologic comfort measures Outcome: Progressing   Problem: Nutrition: Goal: Adequate nutrition will be maintained Outcome: Progressing   Problem: Coping: Goal: Level of anxiety will decrease Outcome: Progressing

## 2023-07-17 NOTE — Progress Notes (Signed)
 PROGRESS NOTE    Wendy Wiley  WJX:[id number] DOB: 1922-02-16 DOA: 07/14/2023 PCP: Aldo Hun, MD   Brief Narrative:  89 y.o. female with medical history significant of hypertension, hypothyroidism, anxiety, depression, dementia, and GERD who presents with complaints of worsening diarrhea.  Found to have c.diff colitis.  Subsequent Afib with RVR, off of amiodarone drip now. Cards on board. She is in sinus rhythm now. Diarrhea has resolved.   Assessment & Plan:  Principal Problem:   Colitis Active Problems:   Transient hypotension   Leukocytosis   Acute respiratory failure with hypoxia (HCC)   AKI (acute kidney injury) (HCC)   Prolonged Q-T interval on ECG   Hypokalemia   Diastolic dysfunction   Coronary atherosclerosis   Hypothyroidism   Hyperlipidemia   GERD   Cholelithiasis   Generalized weakness   Atrial flutter with rapid ventricular response (HCC)   Hx of CABG   Paroxysmal atrial flutter (HCC)    Acute C. difficile colitis, improving - Profuse watery diarrhea after antibiotic use. C. difficile positive with positive toxin PCR.  Placed on p.o. vancomycin 4 times daily x 10 days.   Contact precautions. Diarrhea has resolved now.   Atrial flutter with RVR :Converted to sinus on the morning of 6/18. HR is on the lower side. -No known history of a flutter/fib.  Amiodarone drip dced on 6/18. Metoprolol  25 mg BID with holding parameters On heparin drip for stroke prophylaxis. Son has decided against oral anticoagulation. Heparin drip will be continued until discharge. Son is aware of the risk of potential stroke in the setting of afib while off of anticoagulation.   Acute kidney injury on CKD 3 A: - Creatinine markedly elevated on presentation, 3.6.  Showing improvement after IV fluid hydration.  Likely prerenal etiology given patient's diffuse diarrhea.   Monitor urine output and BMP and magnesium in a.m. Off of IVF   Elevated troponin/type 2 MI: - Likely demand  ischemia given SVT/A-fib RVR. Cardiology on board.   hypokalemia - Replenishment on board.  Will recheck BMP and magnesium in AM.   Goals of care - 89 year old female with multiple comorbidities, presenting with worsening renal function, C. difficile, SVT and hypotension.  Patient is DNR.  Palliative care following.  Do not want advanced measures such as ICU level intubation, vasopressor support however are okay with ongoing treatment regimen.  Prepared to pursue comfort care patient decompensates.   CAD s/p CABG: - Aspirin , statin   Hypothyroidism - TSH mildly low with mildly elevated T4.  May be contributing to patient's A-flutter however will more likely dehydration.  Continue Synthroid  for now.  Patient would need to reevaluate TSH and Synthroid  dosing in the outpatient setting.  DVT prophylaxis: on Heparin drip     Code Status: Limited: Do not attempt resuscitation (DNR) -DNR-LIMITED -Do Not Intubate/DNI  Family Communication:   Status is: Inpatient Remains inpatient appropriate because: Afib, C.diff.    Subjective:  Feels better. Denies diarrhea or abdominal pain. Laying in bed  Examination:  General exam: Appears calm and comfortable  Respiratory system: Clear to auscultation. Respiratory effort normal. Cardiovascular system: Sinus rhythm, bradycardia Gastrointestinal system: Abdomen is nondistended, soft and nontender. No organomegaly or masses felt. Normal bowel sounds heard. Central nervous system: Alert and oriented. No focal neurological deficits. Extremities: Symmetric 5 x 5 power. Skin: No rashes, lesions or ulcers Psychiatry: Judgement and insight appear normal. Mood & affect appropriate.    Diet Orders (From admission, onward)     Start  Ordered   07/14/23 1542  Diet clear liquid Room service appropriate? Yes; Fluid consistency: Thin  Diet effective now       Question Answer Comment  Room service appropriate? Yes   Fluid consistency: Thin       07/14/23 1543            Objective: Vitals:   07/16/23 1935 07/16/23 2325 07/17/23 0400 07/17/23 0755  BP: (!) 120/99 92/62 103/62 129/74  Pulse: 61 (!) 105 (!) 120 88  Resp: 15 17 20 20   Temp: 97.7 F (36.5 C) 97.7 F (36.5 C) 97.6 F (36.4 C) 98.3 F (36.8 C)  TempSrc: Axillary Axillary Oral Oral  SpO2: 97% 95% 92%   Weight:      Height:        Intake/Output Summary (Last 24 hours) at 07/17/2023 1204 Last data filed at 07/16/2023 2327 Gross per 24 hour  Intake 1192.08 ml  Output 250 ml  Net 942.08 ml   Filed Weights   07/14/23 1136 07/15/23 1459  Weight: 63.5 kg 49.6 kg    Scheduled Meds:  aspirin  EC  81 mg Oral QHS   cyanocobalamin   1,000 mcg Oral Daily   famotidine  20 mg Oral BID   fluticasone  furoate-vilanterol  1 puff Inhalation Daily   levothyroxine   200 mcg Oral QAC breakfast   metoprolol  tartrate  25 mg Oral BID   rosuvastatin  20 mg Oral Daily   saccharomyces boulardii  250 mg Oral BID   sodium chloride  flush  3 mL Intravenous Q12H   vancomycin  125 mg Oral Q8H   Continuous Infusions:  heparin 700 Units/hr (07/17/23 1152)    Nutritional status     Body mass index is 22.85 kg/m.  Data Reviewed:   CBC: Recent Labs  Lab 07/14/23 1139 07/15/23 0448 07/16/23 0235 07/17/23 0741  WBC 35.7* 27.7* 23.0* 15.2*  NEUTROABS 31.9*  --   --  11.2*  HGB 11.9* 11.4* 11.1* 11.4*  HCT 36.0 34.9* 34.2* 34.7*  MCV 82.2 82.9 81.8 80.7  PLT 254 246 243 225   Basic Metabolic Panel: Recent Labs  Lab 07/14/23 1432 07/14/23 2221 07/15/23 0448 07/16/23 0235 07/16/23 1818 07/17/23 0357  NA  --  136 136 137 134* 136  K  --  2.8* 3.1* 2.7* 2.9* 3.5  CL  --  98 101 104 103 105  CO2  --  23 21* 23 20* 22  GLUCOSE  --  98 114* 109* 149* 97  BUN  --  41* 36* 30* 27* 27*  CREATININE  --  3.10* 2.71* 2.40* 2.56* 2.42*  CALCIUM  --  7.0* 6.9* 7.3* 7.4* 7.3*  MG 2.2 2.0  --  1.6* 2.6* 2.8*   GFR: Estimated Creatinine Clearance: 9.1 mL/min (A) (by C-G  formula based on SCr of 2.42 mg/dL (H)). Liver Function Tests: Recent Labs  Lab 07/14/23 1139 07/14/23 2221 07/15/23 0448  AST 17 19 21   ALT 9 9 10   ALKPHOS 85 75 78  BILITOT 1.7* 1.0 0.6  PROT 5.4* 4.9* 4.8*  ALBUMIN 1.9* 1.7* 1.6*   No results for input(s): LIPASE, AMYLASE in the last 168 hours. No results for input(s): AMMONIA in the last 168 hours. Coagulation Profile: Recent Labs  Lab 07/14/23 1139  INR 1.2   Cardiac Enzymes: No results for input(s): CKTOTAL, CKMB, CKMBINDEX, TROPONINI in the last 168 hours. BNP (last 3 results) No results for input(s): PROBNP in the last 8760 hours. HbA1C: No results for input(s):  HGBA1C in the last 72 hours. CBG: No results for input(s): GLUCAP in the last 168 hours. Lipid Profile: No results for input(s): CHOL, HDL, LDLCALC, TRIG, CHOLHDL, LDLDIRECT in the last 72 hours. Thyroid  Function Tests: Recent Labs    07/14/23 1432 07/15/23 0448  TSH 0.233*  --   FREET4  --  1.85*   Anemia Panel: No results for input(s): VITAMINB12, FOLATE, FERRITIN, TIBC, IRON, RETICCTPCT in the last 72 hours. Sepsis Labs: Recent Labs  Lab 07/14/23 1231  LATICACIDVEN 1.0    Recent Results (from the past 240 hours)  Blood Culture (routine x 2)     Status: None (Preliminary result)   Collection Time: 07/14/23 11:39 AM   Specimen: BLOOD  Result Value Ref Range Status   Specimen Description BLOOD SITE NOT SPECIFIED  Final   Special Requests   Final    BOTTLES DRAWN AEROBIC AND ANAEROBIC Blood Culture adequate volume   Culture   Final    NO GROWTH 3 DAYS Performed at [facility], [address] [place], [place] [address]    Report Status PENDING  Incomplete  Blood Culture (routine x 2)     Status: None (Preliminary result)   Collection Time: 07/14/23 11:44 AM   Specimen: BLOOD RIGHT HAND  Result Value Ref Range Status   Specimen Description BLOOD RIGHT HAND  Final   Special Requests    Final    BOTTLES DRAWN AEROBIC ONLY Blood Culture adequate volume   Culture   Final    NO GROWTH 3 DAYS Performed at [facility], [address] [place], [place] [address]    Report Status PENDING  Incomplete  C Difficile Quick Screen w PCR reflex     Status: Abnormal   Collection Time: 07/15/23  1:05 AM   Specimen: STOOL  Result Value Ref Range Status   C Diff antigen POSITIVE (A) NEGATIVE Final   C Diff toxin POSITIVE (A) NEGATIVE Final   C Diff interpretation Toxin producing C. difficile detected.  Final    Comment: RESULTS CALLED TO READ BACK BY AND VERIFIED WITH RN E.TRYER ON 07/15/23 AT 0159 BY NM Performed at [facility], [address] [place], [place] [address]   Gastrointestinal Panel by PCR , Stool     Status: None   Collection Time: 07/15/23  1:06 AM   Specimen: STOOL  Result Value Ref Range Status   Campylobacter species NOT DETECTED NOT DETECTED Final   Plesimonas shigelloides NOT DETECTED NOT DETECTED Final   Salmonella species NOT DETECTED NOT DETECTED Final   Yersinia enterocolitica NOT DETECTED NOT DETECTED Final   Vibrio species NOT DETECTED NOT DETECTED Final   Vibrio cholerae NOT DETECTED NOT DETECTED Final   Enteroaggregative E coli (EAEC) NOT DETECTED NOT DETECTED Final   Enteropathogenic E coli (EPEC) NOT DETECTED NOT DETECTED Final   Enterotoxigenic E coli (ETEC) NOT DETECTED NOT DETECTED Final   Shiga like toxin producing E coli (STEC) NOT DETECTED NOT DETECTED Final   Shigella/Enteroinvasive E coli (EIEC) NOT DETECTED NOT DETECTED Final   Cryptosporidium NOT DETECTED NOT DETECTED Final   Cyclospora cayetanensis NOT DETECTED NOT DETECTED Final   Entamoeba histolytica NOT DETECTED NOT DETECTED Final   Giardia lamblia NOT DETECTED NOT DETECTED Final   Adenovirus F40/41 NOT DETECTED NOT DETECTED Final   Astrovirus NOT DETECTED NOT DETECTED Final   Norovirus GI/GII NOT DETECTED NOT DETECTED Final   Rotavirus A NOT DETECTED NOT DETECTED Final  Sapovirus (I, II, IV, and V) NOT DETECTED NOT DETECTED Final    Comment: Performed at [facility], [address] [place], [place] [address]  MRSA Next Gen by PCR, Nasal     Status: None   Collection Time: 07/15/23  3:04 PM   Specimen: Nasal Mucosa; Nasal Swab  Result Value Ref Range Status   MRSA by PCR Next Gen NOT DETECTED NOT DETECTED Final    Comment: (NOTE) The GeneXpert MRSA Assay (FDA approved for NASAL specimens only), is one component of a comprehensive MRSA colonization surveillance program. It is not intended to diagnose MRSA infection nor to guide or monitor treatment for MRSA infections. Test performance is not FDA approved in patients less than 28 years old. Performed at [facility], [address], [place], [place] [address]          Radiology Studies: ECHOCARDIOGRAM COMPLETE Result Date: 07/16/2023    ECHOCARDIOGRAM REPORT   Patient Name:   DIMONIQUE BOURDEAU Date of Exam: 07/16/2023 Medical Rec #:  [id number]     Height:       58.0 in Accession #:    [id number]    Weight:       109.3 lb Date of Birth:  Jun 18, 1922    BSA:          1.409 m Patient Age:    89 years      BP:           131/54 mmHg Patient Gender: F             HR:           65 bpm. Exam Location:  Inpatient Procedure: 2D Echo, Color Doppler and Cardiac Doppler (Both Spectral and Color            Flow Doppler were utilized during procedure). Indications:    Atrial fibrillation  History:        Patient has prior history of Echocardiogram examinations, most                 recent 01/08/2017.  Sonographer:    Janette Medley Referring Phys: [id number] Debria Fang IMPRESSIONS  1. Left ventricular ejection fraction, by estimation, is 70 to 75%. Left ventricular ejection fraction by PLAX is 74 %. The left ventricle has hyperdynamic function. The left ventricle has no regional wall motion abnormalities. Left ventricular diastolic function could not be evaluated.  2. Right ventricular systolic function is  mildly reduced. The right ventricular size is normal. The estimated right ventricular systolic pressure is 23.6 mmHg.  3. The mitral valve is degenerative. Trivial mitral valve regurgitation.  4. The tricuspid valve is abnormal. Tricuspid valve regurgitation is moderate.  5. The aortic valve is tricuspid. Aortic valve regurgitation is moderate. Aortic valve sclerosis/calcification is present, without any evidence of aortic stenosis.  6. The inferior vena cava is normal in size with greater than 50% respiratory variability, suggesting right atrial pressure of 3 mmHg. Comparison(s): Changes from prior study are noted. 01/08/2017: LVEF 65-70%, trivial AI. FINDINGS  Left Ventricle: Left ventricular ejection fraction, by estimation, is 70 to 75%. Left ventricular ejection fraction by PLAX is 74 %. The left ventricle has hyperdynamic function. The left ventricle has no regional wall motion abnormalities. The left ventricular internal cavity size was normal in size. There is no left ventricular hypertrophy. Left ventricular diastolic function could not be evaluated due to atrial fibrillation. Left ventricular diastolic function could not be evaluated. Right Ventricle: The right  ventricular size is normal. No increase in right ventricular wall thickness. Right ventricular systolic function is mildly reduced. The tricuspid regurgitant velocity is 2.27 m/s, and with an assumed right atrial pressure of 3 mmHg, the estimated right ventricular systolic pressure is 23.6 mmHg. Left Atrium: Left atrial size was normal in size. Right Atrium: Right atrial size was normal in size. Pericardium: There is no evidence of pericardial effusion. Mitral Valve: The mitral valve is degenerative in appearance. Mild mitral annular calcification. Trivial mitral valve regurgitation. Tricuspid Valve: The tricuspid valve is abnormal. Tricuspid valve regurgitation is moderate. Aortic Valve: The aortic valve is tricuspid. Aortic valve regurgitation is  moderate. Aortic valve sclerosis/calcification is present, without any evidence of aortic stenosis. Pulmonic Valve: The pulmonic valve was grossly normal. Pulmonic valve regurgitation is not visualized. Aorta: The aortic root and ascending aorta are structurally normal, with no evidence of dilitation. Venous: The inferior vena cava is normal in size with greater than 50% respiratory variability, suggesting right atrial pressure of 3 mmHg. IAS/Shunts: No atrial level shunt detected by color flow Doppler.  LEFT VENTRICLE PLAX 2D LV EF:         Left            Diastology                ventricular     LV e' medial:    6.42 cm/s                ejection        LV E/e' medial:  17.6                fraction by     LV e' lateral:   7.51 cm/s                PLAX is 74      LV E/e' lateral: 15.0                %. LVIDd:         3.60 cm LVIDs:         2.10 cm LV PW:         1.00 cm LV IVS:        0.90 cm LVOT diam:     1.60 cm LV SV:         47 LV SV Index:   33 LVOT Area:     2.01 cm  RIGHT VENTRICLE            IVC RV S prime:     9.90 cm/s  IVC diam: 1.20 cm TAPSE (M-mode): 1.6 cm LEFT ATRIUM             Index        RIGHT ATRIUM          Index LA diam:        3.30 cm 2.34 cm/m   RA Area:     9.98 cm LA Vol (A2C):   26.3 ml 18.67 ml/m  RA Volume:   17.30 ml 12.28 ml/m LA Vol (A4C):   22.9 ml 16.25 ml/m LA Biplane Vol: 25.3 ml 17.96 ml/m  AORTIC VALVE LVOT Vmax:   110.00 cm/s LVOT Vmean:  69.700 cm/s LVOT VTI:    0.232 m  AORTA Ao Root diam: 2.40 cm Ao Asc diam:  2.70 cm MITRAL VALVE                TRICUSPID VALVE MV Area (PHT): 4.57 cm  TR Peak grad:   20.6 mmHg MV Decel Time: 166 msec     TR Vmax:        227.00 cm/s MV E velocity: 113.00 cm/s MV A velocity: 105.00 cm/s  SHUNTS MV E/A ratio:  1.08         Systemic VTI:  0.23 m                             Systemic Diam: 1.60 cm Dinah Franco MD Electronically signed by Dinah Franco MD Signature Date/Time: 07/16/2023/1:05:31 PM    Final            LOS: 3  days   Time spent= 43 mins    Clancy Crimes, MD [facility]  If 7PM-7AM, please contact night-coverage  07/17/2023, 12:04 PM

## 2023-07-17 NOTE — Progress Notes (Addendum)
 PHARMACY - ANTICOAGULATION CONSULT NOTE  Pharmacy Consult for heparin Indication: atrial fibrillation  Allergies  Allergen Reactions   Codeine Other (See Comments)    Reaction:  Altered mental status    Ciprofloxacin Other (See Comments)    Reaction:  Unknown    Penicillins Other (See Comments)    Reaction type/severity unknown   Quinine Swelling    Patient Measurements: Height: 4' 10 (147.3 cm) Weight: 49.6 kg (109 lb 5.6 oz) IBW/kg (Calculated) : 40.9 HEPARIN DW (KG): 49.6  Vital Signs: Temp: 97.6 F (36.4 C) (06/19 0400) Temp Source: Oral (06/19 0400) BP: 103/62 (06/19 0400) Pulse Rate: 120 (06/19 0400)  Labs: Recent Labs    07/14/23 1139 07/14/23 2221 07/15/23 0448 07/15/23 0830 07/15/23 0830 07/15/23 1535 07/16/23 0235 07/16/23 1818 07/17/23 0357  HGB 11.9*  --  11.4*  --   --   --  11.1*  --   --   HCT 36.0  --  34.9*  --   --   --  34.2*  --   --   PLT 254  --  246  --   --   --  243  --   --   LABPROT 15.2  --   --   --   --   --   --   --   --   INR 1.2  --   --   --   --   --   --   --   --   HEPARINUNFRC  --   --   --  0.52   < > 0.62 0.53  --  0.69  CREATININE 3.60*   < > 2.71*  --   --   --  2.40* 2.56* 2.42*  TROPONINIHS  --   --   --  155*  --  138*  --   --   --    < > = values in this interval not displayed.    Estimated Creatinine Clearance: 9.1 mL/min (A) (by C-G formula based on SCr of 2.42 mg/dL (H)).  Assessment: 89 y.o. female with new onset atrial fibrillation. Not on anticoagulation PTA. Pharmacy consulted to dose heparin in setting of afib.   Heparin level is on higher end of therapeutic goal (0.69) on 750 units/hr. CBC stable. No signs of bleeding noted.  Goal of Therapy:  Heparin level 0.3-0.7 units/ml Monitor platelets by anticoagulation protocol: Yes   Plan:  Decrease heparin to 700 units/hr  Daily heparin level and CBC  Monitor for s/sx of bleeding F/u long term [facility] plans  Albino Alu, PharmD PGY2 Cardiology  Pharmacy Resident 07/17/2023,6:51 AM

## 2023-07-17 NOTE — Progress Notes (Signed)
 Pt stood and used steady to go to sink and brush teeth independently pt washed face. Pt tolerated well

## 2023-07-17 NOTE — Plan of Care (Signed)
 Discussed with patient in front of family pain management, plan of care and medications for this evening with some teach back displayed.   Problem: Education: Goal: Knowledge of General Education information will improve Description: Including pain rating scale, medication(s)/side effects and non-pharmacologic comfort measures Outcome: Progressing   Problem: Health Behavior/Discharge Planning: Goal: Ability to manage health-related needs will improve Outcome: Progressing   Problem: Coping: Goal: Level of anxiety will decrease Outcome: Progressing

## 2023-07-17 NOTE — Progress Notes (Signed)
 Palliative:  HPI: 89 y.o. female  with past medical history of dementia, hypertension, HFpEF, CAD, HLD, hypothyroidism, CKD stage 3a, anxiety, depression, GERD admitted on 07/14/2023 from home with loss of appetite, watery diarrhea, abdominal cramps with colitis +C. diff. Recent antibiotic use for mouth infection. Issues with SVT in ED.    I met today at Wendy Wiley's bedside. I spoke with son, [name], as Charity fundraiser and NT worked with Wendy Wiley to get her up in chair. [name] and I reviewed her progress. We discussed in detail progress of treating her infection but concern for risk of failure to thrive following acute illness. We reviewed potential paths we may be heading down. [name] agrees to continue to pursue SNF rehab to allow more time and see how she can progress. We reviewed and [name] has decided to proceed with [facility]. He acknowledges that her underlying dementia may be a limiting factor in her progression. [name] is worried about care moving forward. We reviewed care in facility vs at home. We discussed the potential addition of hospice at facility, home, or hospice facility depending on her status. We discussed the use of hospice to provide assistance of ensuring comfort if time if limited. [name] understands and is interested in continuing conversations and guidance - he agrees with outpatient palliative care to follow and assist.   We spent time discussing risk of stroke with atrial fibrillation. We discussed standard course of action would be anticoagulation. We reviewed the risks vs benefits of anticoagulation in detail. In the setting of dementia and unsteady gait - [name] anticipates that there would like be some fall in the future most likely. We reviewed the suffering that can also come with potential GIB as well. Ultimately [name] and I discuss that if she has a stroke this may be her natural path and if it leads to the end of her life [name] can accept this. His main concern is that she does not suffer  which we feel risk of bleeding may cause more suffering for her.   All questions/concerns addressed. Emotional support provided.   Exam: Alert, confused - a little worse than baseline in the hospital. Sleeps often. No distress. Complains of feeling sick to stomach - provided ginger ale with relief. Abd soft. Moves all extremities.   Plan: - DNR/DNI - Continue medical management - Avoid invasive measures that are likely to cause pain/discomfort and likely to prolong her suffering in the event of further decline - Son has decided not to pursue anticoagulation - Family open to consideration of comfort/hospice if further decline and concern for suffering - Pursue SNF rehab and outpatient palliative  65 min  [name], NP Palliative Medicine Team Pager [phone] (Please see [email or web address] for schedule) Team Phone [phone]

## 2023-07-17 NOTE — Progress Notes (Signed)
 Progress Note  Patient Name: Wendy Wiley Date of Encounter: 07/17/2023 [facility] Cardiologist: Olinda Bertrand, DO   Interval Summary   Awake and alert, pleasant baseline dementia. Son at bedside. No significant cardiovascular events overnight  Vital Signs Vitals:   07/16/23 2325 07/17/23 0400 07/17/23 0755 07/17/23 1208  BP: 92/62 103/62 129/74   Pulse: (!) 105 (!) 120 88 97  Resp: 17 20 20    Temp: 97.7 F (36.5 C) 97.6 F (36.4 C) 98.3 F (36.8 C) (!) 97 F (36.1 C)  TempSrc: Axillary Oral Oral Axillary  SpO2: 95% 92%  98%  Weight:      Height:        Intake/Output Summary (Last 24 hours) at 07/17/2023 1350 Last data filed at 07/17/2023 1209 Gross per 24 hour  Intake 1312.08 ml  Output 250 ml  Net 1062.08 ml      07/15/2023    2:59 PM 07/14/2023   11:36 AM 10/09/2018    9:48 AM  Last 3 Weights  Weight (lbs) 109 lb 5.6 oz 140 lb 121 lb  Weight (kg) 49.6 kg 63.504 kg 54.885 kg      Telemetry/ECG  Currently rate controlled atrial flutter.  Earlier this morning transition to sinus rhythm with postconversion pause noted on telemetry. - Personally Reviewed  Echo 07/16/2023  1. Left ventricular ejection fraction, by estimation, is 70 to 75%. Left  ventricular ejection fraction by PLAX is 74 %. The left ventricle has  hyperdynamic function. The left ventricle has no regional wall motion  abnormalities. Left ventricular  diastolic function could not be evaluated.   2. Right ventricular systolic function is mildly reduced. The right  ventricular size is normal. The estimated right ventricular systolic  pressure is 23.6 mmHg.   3. The mitral valve is degenerative. Trivial mitral valve regurgitation.   4. The tricuspid valve is abnormal. Tricuspid valve regurgitation is  moderate.   5. The aortic valve is tricuspid. Aortic valve regurgitation is moderate.  Aortic valve sclerosis/calcification is present, without any evidence of  aortic stenosis.   6. The  inferior vena cava is normal in size with greater than 50%  respiratory variability, suggesting right atrial pressure of 3 mmHg.   Comparison(s): Changes from prior study are noted. 01/08/2017: LVEF  65-70%, trivial AI.    Physical Exam  GEN: No acute distress.   Neck: No JVD Cardiac: Regular, variable S1-S2, no murmurs rubs or gallops appreciated Respiratory: Clear to auscultation bilaterally. GI: Soft, nontender, non-distended  MS: No edema  Assessment & Plan  Impression:  New onset of atrial flutter with rapid ventricular rate /paroxysmal atrial flutter Coronary artery disease status post CABG. Elevated troponins likely demand ischemia. Prolonged QT Acute C. difficile colitis/dehydration Acute kidney injury on chronic kidney disease stage III. Hypokalemia, Hypothyroidism,?  Iatrogenic hyperthyroidism   Recommendations:  New onset of atrial flutter with rapid ventricular rate Paroxysmal atrial flutter Intermittently converts to sinus rhythm and back into atrial flutter. Hopeful that she will maintain sinus rhythm once her colitis has resolved  Rate control: Metoprolol   Rhythm control: N/A  Thromboembolic prophylaxis: IV heparin drip, transition to oral once family has made decision with regards to long-term anticoagulation.   May still have intermittent episodes of atrial flutter as she has underlying colitis secondary to C. difficile, dehydration, and electrolyte abnormalities and thyroid  disease Continue telemetry Short use of amiodarone during his hospitalization when she was in RVR-discontinued due to underlying thyroid  disease Echocardiogram performed earlier this morning notes hyperdynamic  LVEF, therefore if additional rate control medications are needed may consider IV Cardizem drip Given her CHA2DS2-VASc SCORE would benefit from anticoagulation for thromboembolic prophylaxis, ideally. However, given her advanced age, baseline dementia patient is not able to make an  informed decision. Therefore, spoke to the patient's son (next of kin) who stays with her on a daily basis. His son reported that she does have gait instability and is at risk of falls but has not had a major fall event. No prior history of intracranial or gastrointestinal bleeding We discussed the risks, benefits, and alternatives to anticoagulation. Son understands that placing her on blood thinner for stroke prophylaxis reduces her overall stroke risk. But given her dementia and gait instability she is at risk of falls which could lead to bleeding complications with sustained injury and that may worsen morbidity / mortality.  Patient's son will inform us  with regards to his decision regarding long-term anticoagulation for thromboembolic prophylaxis. More than happy to answer any questions or help in the shared decision-making process. Currently remains on IV heparin drip.  Please transition to oral anticoagulation based on patient's/son's decision and goals of care.  Overall family leaning towards conservative management - reasonable.   Click Here to Calculate/Change CHADS2VASc Score The patient's CHADS2-VASc score is 5, indicating a 7.2% annual risk of stroke.  CHF History: No HTN History: Yes Diabetes History: No Stroke History: No Vascular Disease History: Yes   Coronary artery disease status post CABG. Elevated troponins likely demand ischemia. Denies anginal chest pain prior to arrival to the ED.   No active discomfort. Troponin leak likely secondary to the above Echocardiogram -hyperdynamic LVEF, no significant valvular heart disease.   EKG performed this morning after postconversion pause noted sinus bradycardia with T wave inversions, clinically asymptomatic.   Prolong QT:  Likely secondary to electrolyte abnormalities, potassium this morning 2.7. Avoid QT prolonging medications, discussed with pharmacy prior to medication initiation if any concerns QT segment significantly  improved when in sinus rhythm.   Acute C. difficile colitis/dehydration/ Currently on antibiotics, followed by primary team   Acute kidney injury on chronic kidney disease stage III: Improving. Recommend hydration, defer to primary team   Hypokalemia: Resolved. Has been as low as 2.7    Hypothyroidism,?  Iatrogenic hyperthyroidism: Management per primary team.    For questions or updates, please contact [facility] Please consult [email or web address] for contact info under   Medical decision making: High Reviewed the last progress note from attending physician sick/18/2025. Prescription drug management. Independently reviewed telemetry and EKGs. Prescription drug management. Shared decision making process with regards to anticoagulation with his son who was present at bedside.     Signed, Awilda Bogus, [facility]  A Division of [facility] [address], [place], [place] [address]  [place], [place] [address] 1:50 PM 07/17/23

## 2023-07-17 NOTE — Plan of Care (Signed)

## 2023-07-17 NOTE — TOC Progression Note (Addendum)
 Transition of Care [facility]) - Progression Note    Patient Details  Name: Wendy Wiley MRN: [id number] Date of Birth: 08-08-1922  [facility] CM/SW Contact  Juliane Och, LCSW Phone Number: 07/17/2023, 11:46 AM  Clinical Narrative:     11:46 AM CSW provided patient's son, [name] (patient is not currently fully oriented) with patient's current SNF bed offers [facility], [facility], [facility], [address], [facility], [facility], [facility], [facility]) and [facility].  4:42 PM Per Palliative Care NP, [name] expressed interest in patient discharging to [facility]. CSW informed SNF of acceptance. Patient SNF insurance authorization was approved ([id number]) and is valid 07/18/2023-07/22/2023.  Expected Discharge Plan: Skilled Nursing Facility Barriers to Discharge: Continued Medical Work up, English as a second language teacher (SNF pending bed decision)  Expected Discharge Plan and Services In-house Referral: Clinical Social Work   Post Acute Care Choice: Skilled Nursing Facility Living arrangements for the past 2 months: Single Family Home                                       Social Determinants of Health (SDOH) Interventions SDOH Screenings   Food Insecurity: No Food Insecurity (07/15/2023)  Housing: High Risk (07/15/2023)  Transportation Needs: No Transportation Needs (07/15/2023)  Utilities: Not At Risk (07/15/2023)  Social Connections: Socially Isolated (07/15/2023)  Tobacco Use: Low Risk  (07/14/2023)    Readmission Risk Interventions     No data to display

## 2023-07-18 ENCOUNTER — Other Ambulatory Visit (HOSPITAL_COMMUNITY): Payer: Self-pay

## 2023-07-18 DIAGNOSIS — N1831 Chronic kidney disease, stage 3a: Secondary | ICD-10-CM | POA: Diagnosis not present

## 2023-07-18 DIAGNOSIS — N179 Acute kidney failure, unspecified: Secondary | ICD-10-CM | POA: Diagnosis not present

## 2023-07-18 DIAGNOSIS — K529 Noninfective gastroenteritis and colitis, unspecified: Secondary | ICD-10-CM | POA: Diagnosis not present

## 2023-07-18 DIAGNOSIS — I10 Essential (primary) hypertension: Secondary | ICD-10-CM | POA: Diagnosis not present

## 2023-07-18 DIAGNOSIS — I2510 Atherosclerotic heart disease of native coronary artery without angina pectoris: Secondary | ICD-10-CM | POA: Diagnosis not present

## 2023-07-18 DIAGNOSIS — Z66 Do not resuscitate: Secondary | ICD-10-CM | POA: Diagnosis not present

## 2023-07-18 DIAGNOSIS — I2584 Coronary atherosclerosis due to calcified coronary lesion: Secondary | ICD-10-CM | POA: Diagnosis not present

## 2023-07-18 DIAGNOSIS — I4892 Unspecified atrial flutter: Secondary | ICD-10-CM | POA: Diagnosis not present

## 2023-07-18 DIAGNOSIS — A0472 Enterocolitis due to Clostridium difficile, not specified as recurrent: Secondary | ICD-10-CM | POA: Diagnosis not present

## 2023-07-18 DIAGNOSIS — F32A Depression, unspecified: Secondary | ICD-10-CM | POA: Diagnosis not present

## 2023-07-18 DIAGNOSIS — F0280 Dementia in other diseases classified elsewhere without behavioral disturbance: Secondary | ICD-10-CM | POA: Diagnosis not present

## 2023-07-18 DIAGNOSIS — Z7401 Bed confinement status: Secondary | ICD-10-CM | POA: Diagnosis not present

## 2023-07-18 DIAGNOSIS — K219 Gastro-esophageal reflux disease without esophagitis: Secondary | ICD-10-CM | POA: Diagnosis not present

## 2023-07-18 DIAGNOSIS — R001 Bradycardia, unspecified: Secondary | ICD-10-CM | POA: Diagnosis not present

## 2023-07-18 DIAGNOSIS — Z9181 History of falling: Secondary | ICD-10-CM | POA: Diagnosis not present

## 2023-07-18 DIAGNOSIS — K8020 Calculus of gallbladder without cholecystitis without obstruction: Secondary | ICD-10-CM | POA: Diagnosis not present

## 2023-07-18 DIAGNOSIS — G309 Alzheimer's disease, unspecified: Secondary | ICD-10-CM | POA: Diagnosis not present

## 2023-07-18 DIAGNOSIS — Z515 Encounter for palliative care: Secondary | ICD-10-CM | POA: Diagnosis not present

## 2023-07-18 DIAGNOSIS — Z7189 Other specified counseling: Secondary | ICD-10-CM | POA: Diagnosis not present

## 2023-07-18 DIAGNOSIS — E039 Hypothyroidism, unspecified: Secondary | ICD-10-CM | POA: Diagnosis not present

## 2023-07-18 DIAGNOSIS — E876 Hypokalemia: Secondary | ICD-10-CM | POA: Diagnosis not present

## 2023-07-18 DIAGNOSIS — M6281 Muscle weakness (generalized): Secondary | ICD-10-CM | POA: Diagnosis not present

## 2023-07-18 DIAGNOSIS — Z951 Presence of aortocoronary bypass graft: Secondary | ICD-10-CM | POA: Diagnosis not present

## 2023-07-18 DIAGNOSIS — J9601 Acute respiratory failure with hypoxia: Secondary | ICD-10-CM | POA: Diagnosis not present

## 2023-07-18 DIAGNOSIS — N183 Chronic kidney disease, stage 3 unspecified: Secondary | ICD-10-CM | POA: Diagnosis not present

## 2023-07-18 DIAGNOSIS — A498 Other bacterial infections of unspecified site: Secondary | ICD-10-CM | POA: Diagnosis not present

## 2023-07-18 DIAGNOSIS — F0390 Unspecified dementia without behavioral disturbance: Secondary | ICD-10-CM | POA: Diagnosis not present

## 2023-07-18 DIAGNOSIS — R2689 Other abnormalities of gait and mobility: Secondary | ICD-10-CM | POA: Diagnosis not present

## 2023-07-18 DIAGNOSIS — E782 Mixed hyperlipidemia: Secondary | ICD-10-CM | POA: Diagnosis not present

## 2023-07-18 LAB — CBC
HCT: 35.7 % — ABNORMAL LOW (ref 36.0–46.0)
Hemoglobin: 11.5 g/dL — ABNORMAL LOW (ref 12.0–15.0)
MCH: 26.3 pg (ref 26.0–34.0)
MCHC: 32.2 g/dL (ref 30.0–36.0)
MCV: 81.5 fL (ref 80.0–100.0)
Platelets: 222 10*3/uL (ref 150–400)
RBC: 4.38 MIL/uL (ref 3.87–5.11)
RDW: 17.5 % — ABNORMAL HIGH (ref 11.5–15.5)
WBC: 11.9 10*3/uL — ABNORMAL HIGH (ref 4.0–10.5)
nRBC: 0 % (ref 0.0–0.2)

## 2023-07-18 LAB — BASIC METABOLIC PANEL WITH GFR
Anion gap: 8 (ref 5–15)
BUN: 26 mg/dL — ABNORMAL HIGH (ref 8–23)
CO2: 20 mmol/L — ABNORMAL LOW (ref 22–32)
Calcium: 7.4 mg/dL — ABNORMAL LOW (ref 8.9–10.3)
Chloride: 108 mmol/L (ref 98–111)
Creatinine, Ser: 2.62 mg/dL — ABNORMAL HIGH (ref 0.44–1.00)
GFR, Estimated: 16 mL/min — ABNORMAL LOW (ref >60)
Glucose, Bld: 95 mg/dL (ref 70–99)
Potassium: 4.1 mmol/L (ref 3.5–5.1)
Sodium: 136 mmol/L (ref 135–145)

## 2023-07-18 LAB — MAGNESIUM: Magnesium: 2.6 mg/dL — ABNORMAL HIGH (ref 1.7–2.4)

## 2023-07-18 LAB — HEPARIN LEVEL (UNFRACTIONATED): Heparin Unfractionated: 0.4 [IU]/mL (ref 0.30–0.70)

## 2023-07-18 MED ORDER — METOPROLOL TARTRATE 25 MG PO TABS
12.5000 mg | ORAL_TABLET | Freq: Two times a day (BID) | ORAL | 0 refills | Status: AC
  Filled 2023-07-18: qty 30, 30d supply, fill #0

## 2023-07-18 MED ORDER — CARMEX CLASSIC LIP BALM EX OINT
TOPICAL_OINTMENT | CUTANEOUS | 0 refills | Status: AC | PRN
  Filled 2023-07-18: qty 1, fill #0

## 2023-07-18 MED ORDER — VANCOMYCIN 50 MG/ML ORAL SOLUTION
125.0000 mg | Freq: Three times a day (TID) | ORAL | 0 refills | Status: AC
  Filled 2023-07-18: qty 30, 4d supply, fill #0

## 2023-07-18 MED ORDER — SACCHAROMYCES BOULARDII 250 MG PO CAPS
250.0000 mg | ORAL_CAPSULE | Freq: Two times a day (BID) | ORAL | 0 refills | Status: AC
  Filled 2023-07-18: qty 10, 5d supply, fill #0

## 2023-07-18 NOTE — Progress Notes (Signed)
 Physical Therapy Treatment Patient Details Name: Wendy Wiley MRN: [id number] DOB: 14-Oct-1922 Today's Date: 07/18/2023   History of Present Illness Patient is 89 y.o. female  who presents 07/14/23 with complaints of worsening diarrhea. Pt with C-diff and a-flutter with RVR.  PMH significant for HTN, hypothyroidism, anxiety, depression, dementia, and GERD.    PT Comments  Patient is agreeable to PT session. Patient with increased independence with mobility this session. Standing tolerance limited by fatigue with mild dyspnea with exertion. Family is hopeful for rehab placement at discharge. Recommend to continue PT with rehabilitation < 3 hours/day after this hospital stay.    If plan is discharge home, recommend the following: A lot of help with walking and/or transfers;A lot of help with bathing/dressing/bathroom;Assist for transportation;Help with stairs or ramp for entrance;Assistance with cooking/housework   Can travel by private vehicle     No  Equipment Recommendations  Wheelchair (measurements PT);Wheelchair cushion (measurements PT)    Recommendations for Other Services       Precautions / Restrictions Precautions Precautions: Fall Recall of Precautions/Restrictions: Impaired Precaution/Restrictions Comments: c-diff Restrictions Weight Bearing Restrictions Per Provider Order: No     Mobility  Bed Mobility Overal bed mobility: Needs Assistance Bed Mobility: Supine to Sit     Supine to sit: Min assist, HOB elevated     General bed mobility comments: cues for task initiation and sequencing. assistancve for trunk and intermittently for scooting forward to edge of bed    Transfers Overall transfer level: Needs assistance Equipment used: 1 person hand held assist Transfers: Bed to chair/wheelchair/BSC     Step pivot transfers: Min assist       General transfer comment: lifting assistance for standing and steadying assistance for step pivot to chair. activity  tolerance limited by fatigue and mild dyspnea with exertion. cues for technique    Ambulation/Gait               General Gait Details: not attempted due to fatigue with standing   Stairs             Wheelchair Mobility     Tilt Bed    Modified Rankin (Stroke Patients Only)       Balance Overall balance assessment: Needs assistance Sitting-balance support: Feet supported Sitting balance-Leahy Scale: Fair     Standing balance support: Single extremity supported Standing balance-Leahy Scale: Poor Standing balance comment: external support required, Min A                            Communication Communication Communication: Impaired Factors Affecting Communication: Reduced clarity of speech;Hearing impaired  Cognition Arousal: Alert Behavior During Therapy: WFL for tasks assessed/performed   PT - Cognitive impairments: History of cognitive impairments                       PT - Cognition Comments: patient is cooperative throughout session Following commands: Impaired Following commands impaired: Follows one step commands with increased time    Cueing Cueing Techniques: Verbal cues, Tactile cues, Visual cues  Exercises      General Comments General comments (skin integrity, edema, etc.): patient was incontinent of stool prior to therapist arrival. assistance for pericare and for changing gown. heart rate in the 50's after mobilizing      Pertinent Vitals/Pain Pain Assessment Pain Assessment: No/denies pain    Home Living  Prior Function            PT Goals (current goals can now be found in the care plan section) Acute Rehab PT Goals Patient Stated Goal: not stated PT Goal Formulation: With patient/family Time For Goal Achievement: 07/30/23 Potential to Achieve Goals: Fair Progress towards PT goals: Progressing toward goals    Frequency    Min 2X/week      PT Plan       Co-evaluation              AM-PAC PT 6 Clicks Mobility   Outcome Measure  Help needed turning from your back to your side while in a flat bed without using bedrails?: A Little Help needed moving from lying on your back to sitting on the side of a flat bed without using bedrails?: A Lot Help needed moving to and from a bed to a chair (including a wheelchair)?: A Little Help needed standing up from a chair using your arms (e.g., wheelchair or bedside chair)?: A Little Help needed to walk in hospital room?: A Lot Help needed climbing 3-5 steps with a railing? : Total 6 Click Score: 14    End of Session   Activity Tolerance: Patient limited by fatigue;Patient tolerated treatment well Patient left: in chair;with call bell/phone within reach;with chair alarm set;with family/visitor present Nurse Communication: Mobility status PT Visit Diagnosis: Unsteadiness on feet (R26.81);Other abnormalities of gait and mobility (R26.89);Muscle weakness (generalized) (M62.81)     Time: 0981-1914 PT Time Calculation (min) (ACUTE ONLY): 23 min  Charges:    $Therapeutic Activity: 23-37 mins PT General Charges $$ ACUTE PT VISIT: 1 Visit                     Ozie Bo, PT, MPT    Erlene Hawks 07/18/2023, 10:16 AM

## 2023-07-18 NOTE — Progress Notes (Signed)
 Progress Note  Patient Name: Wendy Wiley Date of Encounter: 07/18/2023 [facility] Cardiologist: Olinda Bertrand, DO   Interval Summary    No events overnight. Patient is more awake and alert sitting upright eating Discharge planning ongoing  Vital Signs Vitals:   07/18/23 0306 07/18/23 0400 07/18/23 0500 07/18/23 0717  BP: (!) 145/42 (!) 131/39 (!) 117/32 (!) 124/42  Pulse: (!) 51 (!) 50 (!) 52 (!) 50  Resp: 20 15 17 20   Temp: 97.7 F (36.5 C)   97.7 F (36.5 C)  TempSrc: Oral   Axillary  SpO2: 94% 96% 98% 97%  Weight: 49.8 kg     Height:        Intake/Output Summary (Last 24 hours) at 07/18/2023 1210 Last data filed at 07/18/2023 0306 Gross per 24 hour  Intake 980 ml  Output 200 ml  Net 780 ml      07/18/2023    3:06 AM 07/15/2023    2:59 PM 07/14/2023   11:36 AM  Last 3 Weights  Weight (lbs) 109 lb 12.6 oz 109 lb 5.6 oz 140 lb  Weight (kg) 49.8 kg 49.6 kg 63.504 kg      Telemetry/ECG  Currently sinus rhythm, has paroxysmal episodes of atrial flutter No prolonged sinus pause or arrest overnight- Personally Reviewed  Echo 07/16/2023  1. Left ventricular ejection fraction, by estimation, is 70 to 75%. Left  ventricular ejection fraction by PLAX is 74 %. The left ventricle has  hyperdynamic function. The left ventricle has no regional wall motion  abnormalities. Left ventricular  diastolic function could not be evaluated.   2. Right ventricular systolic function is mildly reduced. The right  ventricular size is normal. The estimated right ventricular systolic  pressure is 23.6 mmHg.   3. The mitral valve is degenerative. Trivial mitral valve regurgitation.   4. The tricuspid valve is abnormal. Tricuspid valve regurgitation is  moderate.   5. The aortic valve is tricuspid. Aortic valve regurgitation is moderate.  Aortic valve sclerosis/calcification is present, without any evidence of  aortic stenosis.   6. The inferior vena cava is normal in size  with greater than 50%  respiratory variability, suggesting right atrial pressure of 3 mmHg.   Comparison(s): Changes from prior study are noted. 01/08/2017: LVEF  65-70%, trivial AI.    Physical Exam  GEN: No acute distress.   Neck: No JVD Cardiac: Regular, variable S1-S2, no murmurs rubs or gallops appreciated Respiratory: Clear to auscultation bilaterally. GI: Soft, nontender, non-distended  MS: No edema  Assessment & Plan  Impression:  New onset of atrial flutter with rapid ventricular rate /paroxysmal atrial flutter Coronary artery disease status post CABG. Elevated troponins likely demand ischemia. Prolonged QT Acute C. difficile colitis/dehydration Acute kidney injury on chronic kidney disease stage III. Hypokalemia, Hypothyroidism,?  Iatrogenic hyperthyroidism   Recommendations:  New onset of atrial flutter with rapid ventricular rate Paroxysmal atrial flutter Currently sinus rhythm Still continues to transition between sinus and atrial flutter. After a clinical event yesterday which illustrated sinus arrest reduce the dose of metoprolol .  No recurrence of sinus pause or arrest on telemetry. To prevent episodes of RVR we will continue Lopressor  at 12.5 mg p.o. twice daily with holding parameters (hold if systolic blood pressures <132mmHg or heart rate <75 bpm)  Hopeful that she will maintain sinus rhythm once her colitis has resolved  Rate control: Metoprolol   Rhythm control: N/A  Thromboembolic prophylaxis: IV heparin drip. Given her CHA2DS2-VASc score anticoagulation is indicated for thromboembolic  prophylaxis given the prolonged duration of atrial flutter with rapid ventricular rate during this hospitalization (at least 4 weeks for now and later based on clinical trajectory or goals of care).  I discussed the risks, benefits, and alternatives to anticoagulation with the patient's son who is the next of kin.  Patient has baseline dementia and cannot offer her informed  consent.  Palliative care team has also discussed this further during there discussions of goals of care.  Shared decision was to hold off anticoagulation knowing her thromboembolic risk.  Son reconfirms this decision during morning rounds.  Click Here to Calculate/Change CHADS2VASc Score The patient's CHADS2-VASc score is 5, indicating a 7.2% annual risk of stroke.  CHF History: No HTN History: Yes Diabetes History: No Stroke History: No Vascular Disease History: Yes  Sinus Pause / arrest:  Clinically asymptomatic - ? Likely asleep per son.  Occurred 07/17/2023 Hemodynamically stable Shortly thereafter transition to sinus rhythm Lopressor  dose and parameters were changed No reoccurrence overnight Was monitored closely on telemetry.  Coronary artery disease status post CABG. Elevated troponins likely demand ischemia. Denies anginal chest pain prior to arrival to the ED.   No active discomfort. Troponin leak likely secondary to the above Echocardiogram -hyperdynamic LVEF, no significant valvular heart disease.     Prolong QT:  Likely secondary to electrolyte abnormalities, potassium has been as low as 2.7. Avoid QT prolonging medications, discuss with pharmacy prior to medication initiation if any concerns QT segment significantly improved when in sinus rhythm.   Acute C. difficile colitis/dehydration/ Currently on antibiotics, followed by primary team   Acute kidney injury on chronic kidney disease stage III: Improving. Recommend hydration, defer to primary team   Hypokalemia: Resolved. Has been as low as 2.7    Hypothyroidism,?  Iatrogenic hyperthyroidism: Management per primary team.    For questions or updates, please contact [facility] Please consult [email or web address] for contact info under   Medical decision making: High Reviewed the last progress note from attending physician 07/17/2023. Multiple discussions with regards to patient's care with the primary  team, palliative care team, and social work and secure chat. Updated the patient and his son who was present at bedside during rounds Prescription drug management as discussed above with holding parameters Independently reviewed telemetry and EKGs.     Signed, Awilda Bogus, [facility]  A Division of [facility] [address], [place], [place] [address]  [place], [place] [address] 12:10 PM 07/18/23

## 2023-07-18 NOTE — TOC Transition Note (Signed)
 Transition of Care [facility]) - Discharge Note   Patient Details  Name: Wendy Wiley MRN: [id number] Date of Birth: November 18, 1922  Transition of Care [facility]) CM/SW Contact:  Juliane Och, LCSW Phone Number: 07/18/2023, 2:13 PM   Clinical Narrative:     Patient will DC to: [facility] Anticipated DC date: 07/18/2023 Family notified: Aryanah Enslow; Son; [phone] Transport by: Lyna Sandhoff   Per MD patient ready for DC to [facility]. RN to call report prior to discharge [phone]). RN, patient's family, and facility notified of DC (patient is not fully oriented). Discharge Summary and FL2 sent to facility. DC packet on chart. Ambulance transport requested for patient at 14:11.   CSW will sign off for now as social work intervention is no longer needed. Please consult us  again if new needs arise.    Final next level of care: Skilled Nursing Facility Barriers to Discharge: Barriers Resolved   Patient Goals and CMS Choice Patient states their goals for this hospitalization and ongoing recovery are:: SNF CMS Medicare.gov Compare Post Acute Care list provided to:: Patient Represenative (must comment) Choice offered to / list presented to : Adult Children      Discharge Placement              Patient chooses bed at: [facility] Patient to be transferred to facility by: [facility] Name of family member notified: Rosmary Dionisio; Son; [phone] Patient and family notified of of transfer: 07/18/23  Discharge Plan and Services Additional resources added to the After Visit Summary for   In-house Referral: Clinical Social Work   Post Acute Care Choice: Skilled Nursing Facility                               Social Drivers of Health (SDOH) Interventions SDOH Screenings   Food Insecurity: No Food Insecurity (07/15/2023)  Housing: High Risk (07/15/2023)  Transportation Needs: No Transportation Needs (07/15/2023)  Utilities: Not At Risk (07/15/2023)  Social Connections:  Socially Isolated (07/15/2023)  Tobacco Use: Low Risk  (07/14/2023)     Readmission Risk Interventions     No data to display

## 2023-07-18 NOTE — Progress Notes (Addendum)
 1430 - first attempt made to call report to [facility] place no answer. Call transferred and rings until phone hangs up  1615 - 2nd attempt made to call report to [facility] no answer. Call transferred and rings until phone hangs up  1832 - 3rd attempt made to call report. RN received a busy tone as if phone off hook.

## 2023-07-18 NOTE — Progress Notes (Signed)
 Palliative:  HPI: 89 y.o. female with past medical history of dementia, hypertension, HFpEF, CAD, HLD, hypothyroidism, CKD stage 3a, anxiety, depression, GERD admitted on 07/14/2023 from home with loss of appetite, watery diarrhea, abdominal cramps with colitis +C. diff. Recent antibiotic use for mouth infection. Issues with SVT in ED.   [name] is sleeping. No family at bedside. I received call and spoke extensively with granddaughter, Wendy Wiley. Wendy Wiley and her sister Wendy Wiley are very supportive but also has young children that have kept them in home school testing periods and out of state for events over the past couple of days. They have been speaking with Wendy Wiley but know he has been very overwhelmed. I spent time reviewing and updating Wendy Wiley on progress and my conversations with Wendy Wiley over the past few days. We reviewed plans for SNF rehab. Wendy Wiley expresses concern for her grandmother going to SNF rehab and her safety with underlying dementia there. I explained my impression from Wendy Wiley is that he is very concerned about his ability to care for her in the home and has expressed desire for placement. Wendy Wiley understands.   We spent time reviewing goals of care and expectations. We discussed concern for overall failure to thrive. Wendy Wiley has experience working with home health agency and hospice. They had [facility] with her grandfather and she feels that they took good care of him. She expresses desire for hospice at home and we discussed this as the likely next step from rehab unless she surprises us  and does better than we think. We also discussed that they can opt to take her home with hospice support from rehab at any point of time they wish. Wendy Wiley shares that they would likely opt to hire additional support from caregiver agency to help at home. She agrees to move forward with SNF rehab and will continue conversations with family about next steps forward. They will continue to support  her in rehab. She agrees to have palliative follow at SNF rehab and to assist with transition home with hospice as desired/indicated - they would want [facility] - I will update them on situation. She has further specific questions about facility and practices - I updated CSW on her concerns.   I also updated Wendy Wiley on my conversation with Wendy Wiley about anticoagulation with risk of stroke in the setting of afib. Wendy Wiley also agrees with decision to forego anticoagulation therapy with risk of falls due to dementia.   All questions/concerns addressed to the best of my ability.   Exam: Sleeping comfortably - did not awaken. No distress.   Plan:  - DNR/DNI - SNF rehab - considering home with hospice in the near future - [facility] palliative to follow  45 min  [name], NP Palliative Medicine Team Pager [phone] (Please see [email or web address] for schedule) Team Phone [phone]

## 2023-07-18 NOTE — Discharge Summary (Signed)
 Physician Discharge Summary   Patient: Wendy Wiley MRN: [id number] DOB: 1922/06/25  Admit date:     07/14/2023  Discharge date: 07/18/23  Discharge Physician: Clancy Crimes   PCP: Aldo Hun, MD   Recommendations at discharge:    Follow up with your PCP in one week. Take meds as prescribed Check BP and HR every day.  Discharge Diagnoses: Principal Problem:   Colitis Active Problems:   Transient hypotension   Leukocytosis   Acute respiratory failure with hypoxia (HCC)   AKI (acute kidney injury) (HCC)   Prolonged Q-T interval on ECG   Hypokalemia   Diastolic dysfunction   Coronary atherosclerosis   Hypothyroidism   Hyperlipidemia   GERD   Cholelithiasis   Generalized weakness   Atrial flutter with rapid ventricular response (HCC)   Hx of CABG   Paroxysmal atrial flutter (HCC)   Conduction disorder of the heart   Sinus arrest  Resolved Problems:   * No resolved hospital problems. [facility] Course: Acute C. difficile colitis, treated and improved - Profuse watery diarrhea after antibiotic use. C. difficile positive with positive toxin PCR.  Placed on p.o. vancomycin 4 times daily x 10 days.   Contact precautions. Diarrhea has resolved now. Leukocytosis has resolved too.   Atrial flutter with RVR :Converted to sinus on the morning of 6/18. HR is on the lower side. -No known history of a flutter/fib.  Amiodarone drip dced on 6/18. Metoprolol  12.5 mg BID with holding parameters (Do not administer if HR is less than 75 or SBP is less than 110) On heparin drip for stroke prophylaxis, dced on discharge day. Son has decided against oral anticoagulation. Son is aware of the risk of potential stroke in the setting of afib while off of anticoagulation.   Acute kidney injury on CKD 3 A: Improved - Creatinine markedly elevated on presentation, 3.6.  Showing improvement after IV fluid hydration.  Likely prerenal etiology given patient's diffuse diarrhea.    Off of IVF.  Encouraged oral hydration. Not a candidate for HD   Elevated troponin/type 2 MI: - Likely demand ischemia given SVT/A-fib RVR. Cardiology on board. Continue with oral metoprolol  with holding parameters.   hypokalemia - Repleted   Goals of care - 89 year old female with multiple comorbidities, presenting with worsening renal function, C. difficile, SVT and hypotension.  Patient is DNR.  Palliative care following.  Do not want advanced measures such as ICU level intubation, vasopressor support however are okay with ongoing treatment regimen.  Prepared to pursue comfort care patient decompensates.   CAD s/p CABG: - Aspirin , statin   Hypothyroidism - TSH mildly low with mildly elevated T4.  May be contributing to patient's A-flutter.  Holding synthroid  for now.  Patient would need to reevaluate TSH and Synthroid  dosing in the outpatient setting.         Consultants: Cardiology, Palliative care Procedures performed: None  Disposition: Skilled nursing facility Diet recommendation:  Cardiac diet DISCHARGE MEDICATION: Allergies as of 07/18/2023       Reactions   Codeine Other (See Comments)   Reaction:  Altered mental status    Ciprofloxacin Other (See Comments)   Reaction:  Unknown    Penicillins Other (See Comments)   Reaction type/severity unknown   Quinine Swelling        Medication List     STOP taking these medications    amLODipine  2.5 MG tablet Commonly known as: NORVASC    furosemide  20 MG tablet Commonly known as: LASIX   levothyroxine  200 MCG tablet Commonly known as: SYNTHROID    potassium chloride  10 MEQ tablet Commonly known as: KLOR-CON  M       TAKE these medications    aspirin  EC 81 MG tablet Take 81 mg by mouth at bedtime.   budesonide-formoterol  160-4.5 MCG/ACT inhaler Commonly known as: SYMBICORT Inhale 2 puffs into the lungs as needed (wheezing).   Cholecalciferol 1.25 MG (50000 UT) Tabs Take 1 tablet by mouth daily.    cyanocobalamin  1000 MCG tablet Take 1,000 mcg by mouth daily.   escitalopram  5 MG tablet Commonly known as: LEXAPRO  Take 5 mg by mouth at bedtime.   famotidine 20 MG tablet Commonly known as: PEPCID Take 20 mg by mouth 2 (two) times daily.   ipratropium 0.02 % nebulizer solution Commonly known as: ATROVENT  Take 2.5 mLs (0.5 mg total) by nebulization every 6 (six) hours as needed for wheezing or shortness of breath.   Kaopectate 262 MG Tabs Generic drug: Bismuth Subsalicylate Take 2 tablets by mouth 4 (four) times daily -  with meals and at bedtime.   levalbuterol  0.63 MG/3ML nebulizer solution Commonly known as: XOPENEX  Take 3 mLs (0.63 mg total) by nebulization every 6 (six) hours as needed for wheezing or shortness of breath.   lip balm ointment Apply topically as needed.   metoprolol  tartrate 25 MG tablet Commonly known as: LOPRESSOR  Take 0.5 tablets (12.5 mg total) by mouth 2 (two) times daily. Hold if heart rate is less than 75 or Systolic BP is less than 110. What changed:  how much to take additional instructions   Myrbetriq  25 MG Tb24 tablet Generic drug: mirabegron  ER Take 25 mg by mouth daily.   Pataday 0.1 % ophthalmic solution Generic drug: olopatadine Place 1 drop into both eyes 2 (two) times daily.   PreserVision AREDS 2 Caps Take 1 capsule by mouth 2 (two) times daily.   rosuvastatin 20 MG tablet Commonly known as: CRESTOR Take 20 mg by mouth daily.   saccharomyces boulardii 250 MG capsule Commonly known as: FLORASTOR Take 1 capsule (250 mg total) by mouth 2 (two) times daily.   vancomycin 50 mg/mL Soln oral solution Commonly known as: VANCOCIN Take 2.5 mLs (125 mg total) by mouth every 8 (eight) hours for 4 days.        Discharge Exam: Filed Weights   07/14/23 1136 07/15/23 1459 07/18/23 0306  Weight: 63.5 kg 49.6 kg 49.8 kg   Alert and awake, sinus rhythm, no respiratory issues  Condition at discharge: good  The results of  significant diagnostics from this hospitalization (including imaging, microbiology, ancillary and laboratory) are listed below for reference.   Imaging Studies: ECHOCARDIOGRAM COMPLETE Result Date: 07/16/2023    ECHOCARDIOGRAM REPORT   Patient Name:   Wendy Wiley Date of Exam: 07/16/2023 Medical Rec #:  [id number]     Height:       58.0 in Accession #:    [id number]    Weight:       109.3 lb Date of Birth:  05-Jan-1923    BSA:          1.409 m Patient Age:    89 years      BP:           131/54 mmHg Patient Gender: F             HR:           65 bpm. Exam Location:  Inpatient Procedure: 2D Echo, Color Doppler and Cardiac Doppler (Both  Spectral and Color            Flow Doppler were utilized during procedure). Indications:    Atrial fibrillation  History:        Patient has prior history of Echocardiogram examinations, most                 recent 01/08/2017.  Sonographer:    Janette Medley Referring Phys: [id number] Debria Fang IMPRESSIONS  1. Left ventricular ejection fraction, by estimation, is 70 to 75%. Left ventricular ejection fraction by PLAX is 74 %. The left ventricle has hyperdynamic function. The left ventricle has no regional wall motion abnormalities. Left ventricular diastolic function could not be evaluated.  2. Right ventricular systolic function is mildly reduced. The right ventricular size is normal. The estimated right ventricular systolic pressure is 23.6 mmHg.  3. The mitral valve is degenerative. Trivial mitral valve regurgitation.  4. The tricuspid valve is abnormal. Tricuspid valve regurgitation is moderate.  5. The aortic valve is tricuspid. Aortic valve regurgitation is moderate. Aortic valve sclerosis/calcification is present, without any evidence of aortic stenosis.  6. The inferior vena cava is normal in size with greater than 50% respiratory variability, suggesting right atrial pressure of 3 mmHg. Comparison(s): Changes from prior study are noted. 01/08/2017: LVEF 65-70%, trivial  AI. FINDINGS  Left Ventricle: Left ventricular ejection fraction, by estimation, is 70 to 75%. Left ventricular ejection fraction by PLAX is 74 %. The left ventricle has hyperdynamic function. The left ventricle has no regional wall motion abnormalities. The left ventricular internal cavity size was normal in size. There is no left ventricular hypertrophy. Left ventricular diastolic function could not be evaluated due to atrial fibrillation. Left ventricular diastolic function could not be evaluated. Right Ventricle: The right ventricular size is normal. No increase in right ventricular wall thickness. Right ventricular systolic function is mildly reduced. The tricuspid regurgitant velocity is 2.27 m/s, and with an assumed right atrial pressure of 3 mmHg, the estimated right ventricular systolic pressure is 23.6 mmHg. Left Atrium: Left atrial size was normal in size. Right Atrium: Right atrial size was normal in size. Pericardium: There is no evidence of pericardial effusion. Mitral Valve: The mitral valve is degenerative in appearance. Mild mitral annular calcification. Trivial mitral valve regurgitation. Tricuspid Valve: The tricuspid valve is abnormal. Tricuspid valve regurgitation is moderate. Aortic Valve: The aortic valve is tricuspid. Aortic valve regurgitation is moderate. Aortic valve sclerosis/calcification is present, without any evidence of aortic stenosis. Pulmonic Valve: The pulmonic valve was grossly normal. Pulmonic valve regurgitation is not visualized. Aorta: The aortic root and ascending aorta are structurally normal, with no evidence of dilitation. Venous: The inferior vena cava is normal in size with greater than 50% respiratory variability, suggesting right atrial pressure of 3 mmHg. IAS/Shunts: No atrial level shunt detected by color flow Doppler.  LEFT VENTRICLE PLAX 2D LV EF:         Left            Diastology                ventricular     LV e' medial:    6.42 cm/s                ejection         LV E/e' medial:  17.6                fraction by     LV e' lateral:   7.51 cm/s  PLAX is 74      LV E/e' lateral: 15.0                %. LVIDd:         3.60 cm LVIDs:         2.10 cm LV PW:         1.00 cm LV IVS:        0.90 cm LVOT diam:     1.60 cm LV SV:         47 LV SV Index:   33 LVOT Area:     2.01 cm  RIGHT VENTRICLE            IVC RV S prime:     9.90 cm/s  IVC diam: 1.20 cm TAPSE (M-mode): 1.6 cm LEFT ATRIUM             Index        RIGHT ATRIUM          Index LA diam:        3.30 cm 2.34 cm/m   RA Area:     9.98 cm LA Vol (A2C):   26.3 ml 18.67 ml/m  RA Volume:   17.30 ml 12.28 ml/m LA Vol (A4C):   22.9 ml 16.25 ml/m LA Biplane Vol: 25.3 ml 17.96 ml/m  AORTIC VALVE LVOT Vmax:   110.00 cm/s LVOT Vmean:  69.700 cm/s LVOT VTI:    0.232 m  AORTA Ao Root diam: 2.40 cm Ao Asc diam:  2.70 cm MITRAL VALVE                TRICUSPID VALVE MV Area (PHT): 4.57 cm     TR Peak grad:   20.6 mmHg MV Decel Time: 166 msec     TR Vmax:        227.00 cm/s MV E velocity: 113.00 cm/s MV A velocity: 105.00 cm/s  SHUNTS MV E/A ratio:  1.08         Systemic VTI:  0.23 m                             Systemic Diam: 1.60 cm Dinah Franco MD Electronically signed by Dinah Franco MD Signature Date/Time: 07/16/2023/1:05:31 PM    Final    US  Abdomen Limited RUQ (LIVER/GB) Result Date: 07/14/2023 CLINICAL DATA:  Abdominal pain EXAM: ULTRASOUND ABDOMEN LIMITED RIGHT UPPER QUADRANT COMPARISON:  Ultrasound 2009. CT scan without contrast earlier 02/13/2023 FINDINGS: Gallbladder: Dilated gallbladder. Length up to 9.6 cm. No wall thickening or adjacent fluid. No shadowing stones. Common bile duct: Diameter: 3 mm Liver: No focal lesion identified. Within normal limits in parenchymal echogenicity. Portal vein is patent on color Doppler imaging with normal direction of blood flow towards the liver. Other: None. IMPRESSION: Dilated gallbladder. No shadowing stones or ductal dilatation. If there is further concern of  the gallbladder additional workup may be useful such as HIDA scan or MRCP as clinically appropriate Electronically Signed   By: Adrianna Horde M.D.   On: 07/14/2023 16:41   CT ABDOMEN PELVIS WO CONTRAST Result Date: 07/14/2023 CLINICAL DATA:  Abdominal pain EXAM: CT ABDOMEN AND PELVIS WITHOUT CONTRAST TECHNIQUE: Multidetector CT imaging of the abdomen and pelvis was performed following the standard protocol without IV contrast. RADIATION DOSE REDUCTION: This exam was performed according to the departmental dose-optimization program which includes automated exposure control, adjustment of the mA and/or kV according to patient size and/or  use of iterative reconstruction technique. COMPARISON:  None Available. FINDINGS: Lower chest: Small right pleural effusion. Bibasilar atelectasis. Cardiomegaly, coronary artery disease, aortic atherosclerosis. Hepatobiliary: Gallbladder is distended. Small layering stones within the gallbladder. No biliary ductal dilatation. No focal hepatic abnormality. Pancreas: No focal abnormality or ductal dilatation.  Mild atrophy. Spleen: No focal abnormality.  Normal size. Adrenals/Urinary Tract: No adrenal abnormality. No focal renal abnormality. No stones or hydronephrosis. Urinary bladder is unremarkable. Stomach/Bowel: Appears to be mild wall thickening within portions of the colon, best seen in the right colon and at the hepatic flexure as well as sigmoid colon. Findings concerning for colitis. No bowel obstruction. Stomach and small bowel decompressed, unremarkable. Vascular/Lymphatic: Aortic atherosclerosis. No evidence of aneurysm or adenopathy. Reproductive: Uterus and adnexa unremarkable.  No mass. Other: No free fluid or free air. Musculoskeletal: No acute bony abnormality. IMPRESSION: Distended gallbladder with layering gallstones. This could be further evaluated with right upper quadrant ultrasound if there is clinical concern for acute cholecystitis. Areas of wall thickening  in the right colon, hepatic flexure, and sigmoid colon concerning for infectious/inflammatory colitis. Aortic atherosclerosis. Small right pleural effusion, bibasilar atelectasis. Electronically Signed   By: Janeece Mechanic M.D.   On: 07/14/2023 15:04   DG Chest Port 1 View Result Date: 07/14/2023 CLINICAL DATA:  Concern for sepsis. EXAM: PORTABLE CHEST 1 VIEW COMPARISON:  02/04/2018. FINDINGS: The heart size and mediastinal contours are within normal limits. Prior median sternotomy and CABG. Aortic atherosclerosis. Low lung volumes. Mild left basilar atelectasis/scarring. No focal consolidation, sizeable pleural effusion, or pneumothorax. No acute osseous abnormality. IMPRESSION: Low lung volumes with mild left basilar atelectasis/scarring. Otherwise, no acute cardiopulmonary findings. Electronically Signed   By: Mannie Seek M.D.   On: 07/14/2023 12:52    Microbiology: Results for orders placed or performed during the hospital encounter of 07/14/23  Blood Culture (routine x 2)     Status: None (Preliminary result)   Collection Time: 07/14/23 11:39 AM   Specimen: BLOOD  Result Value Ref Range Status   Specimen Description BLOOD SITE NOT SPECIFIED  Final   Special Requests   Final    BOTTLES DRAWN AEROBIC AND ANAEROBIC Blood Culture adequate volume   Culture   Final    NO GROWTH 4 DAYS Performed at [facility], [address], [place], [place] [address]    Report Status PENDING  Incomplete  Blood Culture (routine x 2)     Status: None (Preliminary result)   Collection Time: 07/14/23 11:44 AM   Specimen: BLOOD RIGHT HAND  Result Value Ref Range Status   Specimen Description BLOOD RIGHT HAND  Final   Special Requests   Final    BOTTLES DRAWN AEROBIC ONLY Blood Culture adequate volume   Culture   Final    NO GROWTH 4 DAYS Performed at [facility], [address], [place], [place] [address]    Report Status PENDING  Incomplete  C Difficile Quick Screen w PCR reflex      Status: Abnormal   Collection Time: 07/15/23  1:05 AM   Specimen: STOOL  Result Value Ref Range Status   C Diff antigen POSITIVE (A) NEGATIVE Final   C Diff toxin POSITIVE (A) NEGATIVE Final   C Diff interpretation Toxin producing C. difficile detected.  Final    Comment: RESULTS CALLED TO READ BACK BY AND VERIFIED WITH RN E.TRYER ON 07/15/23 AT 0159 BY NM Performed at [facility], [address], [place], [place] [address]  Gastrointestinal Panel by PCR , Stool     Status: None   Collection Time: 07/15/23  1:06 AM   Specimen: STOOL  Result Value Ref Range Status   Campylobacter species NOT DETECTED NOT DETECTED Final   Plesimonas shigelloides NOT DETECTED NOT DETECTED Final   Salmonella species NOT DETECTED NOT DETECTED Final   Yersinia enterocolitica NOT DETECTED NOT DETECTED Final   Vibrio species NOT DETECTED NOT DETECTED Final   Vibrio cholerae NOT DETECTED NOT DETECTED Final   Enteroaggregative E coli (EAEC) NOT DETECTED NOT DETECTED Final   Enteropathogenic E coli (EPEC) NOT DETECTED NOT DETECTED Final   Enterotoxigenic E coli (ETEC) NOT DETECTED NOT DETECTED Final   Shiga like toxin producing E coli (STEC) NOT DETECTED NOT DETECTED Final   Shigella/Enteroinvasive E coli (EIEC) NOT DETECTED NOT DETECTED Final   Cryptosporidium NOT DETECTED NOT DETECTED Final   Cyclospora cayetanensis NOT DETECTED NOT DETECTED Final   Entamoeba histolytica NOT DETECTED NOT DETECTED Final   Giardia lamblia NOT DETECTED NOT DETECTED Final   Adenovirus F40/41 NOT DETECTED NOT DETECTED Final   Astrovirus NOT DETECTED NOT DETECTED Final   Norovirus GI/GII NOT DETECTED NOT DETECTED Final   Rotavirus A NOT DETECTED NOT DETECTED Final   Sapovirus (I, II, IV, and V) NOT DETECTED NOT DETECTED Final    Comment: Performed at [facility], [address] [place], [place] [address]  MRSA Next Gen by PCR, Nasal     Status: None   Collection Time: 07/15/23  3:04 PM   Specimen: Nasal  Mucosa; Nasal Swab  Result Value Ref Range Status   MRSA by PCR Next Gen NOT DETECTED NOT DETECTED Final    Comment: (NOTE) The GeneXpert MRSA Assay (FDA approved for NASAL specimens only), is one component of a comprehensive MRSA colonization surveillance program. It is not intended to diagnose MRSA infection nor to guide or monitor treatment for MRSA infections. Test performance is not FDA approved in patients less than 46 years old. Performed at [facility], [address] [place], [place] [address]     Labs: CBC: Recent Labs  Lab 07/14/23 1139 07/15/23 0448 07/16/23 0235 07/17/23 0741 07/18/23 0218  WBC 35.7* 27.7* 23.0* 15.2* 11.9*  NEUTROABS 31.9*  --   --  11.2*  --   HGB 11.9* 11.4* 11.1* 11.4* 11.5*  HCT 36.0 34.9* 34.2* 34.7* 35.7*  MCV 82.2 82.9 81.8 80.7 81.5  PLT 254 246 243 225 222   Basic Metabolic Panel: Recent Labs  Lab 07/16/23 0235 07/16/23 1818 07/17/23 0357 07/17/23 1733 07/18/23 0218  NA 137 134* 136 136 136  K 2.7* 2.9* 3.5 4.3 4.1  CL 104 103 105 109 108  CO2 23 20* 22 19* 20*  GLUCOSE 109* 149* 97 107* 95  BUN 30* 27* 27* 27* 26*  CREATININE 2.40* 2.56* 2.42* 2.50* 2.62*  CALCIUM 7.3* 7.4* 7.3* 7.7* 7.4*  MG 1.6* 2.6* 2.8* 2.7* 2.6*   Liver Function Tests: Recent Labs  Lab 07/14/23 1139 07/14/23 2221 07/15/23 0448  AST 17 19 21   ALT 9 9 10   ALKPHOS 85 75 78  BILITOT 1.7* 1.0 0.6  PROT 5.4* 4.9* 4.8*  ALBUMIN 1.9* 1.7* 1.6*   CBG: No results for input(s): GLUCAP in the last 168 hours.  Discharge time spent: greater than 30 minutes.  Signed: Clancy Crimes, MD [facility] 07/18/2023

## 2023-07-18 NOTE — Plan of Care (Signed)
  Problem: Education: Goal: Knowledge of General Education information will improve Description: Including pain rating scale, medication(s)/side effects and non-pharmacologic comfort measures Outcome: Progressing   Problem: Clinical Measurements: Goal: Respiratory complications will improve Outcome: Progressing   Problem: Activity: Goal: Risk for activity intolerance will decrease Outcome: Progressing   Problem: Nutrition: Goal: Adequate nutrition will be maintained Outcome: Progressing   Problem: Elimination: Goal: Will not experience complications related to urinary retention Outcome: Progressing   Problem: Pain Managment: Goal: General experience of comfort will improve and/or be controlled Outcome: Progressing   Problem: Safety: Goal: Ability to remain free from injury will improve Outcome: Progressing   Problem: Skin Integrity: Goal: Risk for impaired skin integrity will decrease Outcome: Progressing

## 2023-07-18 NOTE — Progress Notes (Signed)
 OT Cancellation Note  Patient Details Name: Wendy Wiley MRN: [id number] DOB: 09-16-1922   Cancelled Treatment:    Reason Eval/Treat Not Completed: Other (comment) Current D/C plan is SNF with SNF approval. No apparent immediate acute care OT needs, therefore will defer OT to SNF. Discussed with SW. If OT eval is needed please call Acute Rehab Dept. at [phone].  Bridie Colquhoun,HILLARY 07/18/2023, 1:09 PM Milburn Aliment, OT/L   Acute OT Clinical Specialist Acute Rehabilitation Services Pager [phone] Office [phone]

## 2023-07-19 LAB — CULTURE, BLOOD (ROUTINE X 2)
Culture: NO GROWTH
Culture: NO GROWTH
Special Requests: ADEQUATE
Special Requests: ADEQUATE

## 2023-07-19 NOTE — Progress Notes (Signed)
 [facility]?Hospital Liaison Note:??   ???   Notified by Bernarda Kennyth PIETY of patient/family request for [facility] Palliative services in facility after discharge.????????     Please call with any hospice or outpatient palliative care related questions.???   ????   Thank you for the opportunity to participate in this patient's care.   Nat Babe, BSN, [facility] Liaison [phone]

## 2023-07-19 NOTE — Progress Notes (Signed)
 Patient discharged to [facility] via [place]. Patient's son at bedside.

## 2023-07-21 DIAGNOSIS — R001 Bradycardia, unspecified: Secondary | ICD-10-CM | POA: Diagnosis not present

## 2023-07-21 DIAGNOSIS — N179 Acute kidney failure, unspecified: Secondary | ICD-10-CM | POA: Diagnosis not present

## 2023-07-21 DIAGNOSIS — N1831 Chronic kidney disease, stage 3a: Secondary | ICD-10-CM | POA: Diagnosis not present

## 2023-07-21 DIAGNOSIS — Z66 Do not resuscitate: Secondary | ICD-10-CM | POA: Diagnosis not present

## 2023-07-21 DIAGNOSIS — I4892 Unspecified atrial flutter: Secondary | ICD-10-CM | POA: Diagnosis not present

## 2023-07-22 DIAGNOSIS — K529 Noninfective gastroenteritis and colitis, unspecified: Secondary | ICD-10-CM | POA: Diagnosis not present

## 2023-07-22 DIAGNOSIS — I4892 Unspecified atrial flutter: Secondary | ICD-10-CM | POA: Diagnosis not present

## 2023-07-22 DIAGNOSIS — F32A Depression, unspecified: Secondary | ICD-10-CM | POA: Diagnosis not present

## 2023-07-22 DIAGNOSIS — R2689 Other abnormalities of gait and mobility: Secondary | ICD-10-CM | POA: Diagnosis not present

## 2023-07-22 DIAGNOSIS — M6281 Muscle weakness (generalized): Secondary | ICD-10-CM | POA: Diagnosis not present

## 2023-07-23 DIAGNOSIS — Z66 Do not resuscitate: Secondary | ICD-10-CM | POA: Diagnosis not present

## 2023-07-23 DIAGNOSIS — I4892 Unspecified atrial flutter: Secondary | ICD-10-CM | POA: Diagnosis not present

## 2023-07-23 DIAGNOSIS — R001 Bradycardia, unspecified: Secondary | ICD-10-CM | POA: Diagnosis not present

## 2023-07-23 DIAGNOSIS — N179 Acute kidney failure, unspecified: Secondary | ICD-10-CM | POA: Diagnosis not present

## 2023-07-23 DIAGNOSIS — N1831 Chronic kidney disease, stage 3a: Secondary | ICD-10-CM | POA: Diagnosis not present

## 2023-07-25 DIAGNOSIS — N1831 Chronic kidney disease, stage 3a: Secondary | ICD-10-CM | POA: Diagnosis not present

## 2023-07-25 DIAGNOSIS — I4892 Unspecified atrial flutter: Secondary | ICD-10-CM | POA: Diagnosis not present

## 2023-07-25 DIAGNOSIS — Z66 Do not resuscitate: Secondary | ICD-10-CM | POA: Diagnosis not present

## 2023-07-25 DIAGNOSIS — R001 Bradycardia, unspecified: Secondary | ICD-10-CM | POA: Diagnosis not present

## 2023-07-25 DIAGNOSIS — N179 Acute kidney failure, unspecified: Secondary | ICD-10-CM | POA: Diagnosis not present

## 2023-07-27 DIAGNOSIS — M6281 Muscle weakness (generalized): Secondary | ICD-10-CM | POA: Diagnosis not present

## 2023-07-27 DIAGNOSIS — N179 Acute kidney failure, unspecified: Secondary | ICD-10-CM | POA: Diagnosis not present

## 2023-07-27 DIAGNOSIS — I2584 Coronary atherosclerosis due to calcified coronary lesion: Secondary | ICD-10-CM | POA: Diagnosis not present

## 2023-07-27 DIAGNOSIS — I4892 Unspecified atrial flutter: Secondary | ICD-10-CM | POA: Diagnosis not present

## 2023-07-27 DIAGNOSIS — K219 Gastro-esophageal reflux disease without esophagitis: Secondary | ICD-10-CM | POA: Diagnosis not present

## 2023-07-27 DIAGNOSIS — E782 Mixed hyperlipidemia: Secondary | ICD-10-CM | POA: Diagnosis not present

## 2023-07-27 DIAGNOSIS — N1831 Chronic kidney disease, stage 3a: Secondary | ICD-10-CM | POA: Diagnosis not present

## 2023-07-27 DIAGNOSIS — R001 Bradycardia, unspecified: Secondary | ICD-10-CM | POA: Diagnosis not present

## 2023-07-28 DIAGNOSIS — I4892 Unspecified atrial flutter: Secondary | ICD-10-CM | POA: Diagnosis not present

## 2023-07-28 DIAGNOSIS — Z9181 History of falling: Secondary | ICD-10-CM | POA: Diagnosis not present

## 2023-07-28 DIAGNOSIS — N1831 Chronic kidney disease, stage 3a: Secondary | ICD-10-CM | POA: Diagnosis not present

## 2023-07-28 DIAGNOSIS — A0472 Enterocolitis due to Clostridium difficile, not specified as recurrent: Secondary | ICD-10-CM | POA: Diagnosis not present

## 2023-07-28 DIAGNOSIS — N179 Acute kidney failure, unspecified: Secondary | ICD-10-CM | POA: Diagnosis not present

## 2023-07-29 DIAGNOSIS — M6281 Muscle weakness (generalized): Secondary | ICD-10-CM | POA: Diagnosis not present

## 2023-07-29 DIAGNOSIS — K529 Noninfective gastroenteritis and colitis, unspecified: Secondary | ICD-10-CM | POA: Diagnosis not present

## 2023-07-29 DIAGNOSIS — F32A Depression, unspecified: Secondary | ICD-10-CM | POA: Diagnosis not present

## 2023-07-29 DIAGNOSIS — R2689 Other abnormalities of gait and mobility: Secondary | ICD-10-CM | POA: Diagnosis not present

## 2023-07-29 DIAGNOSIS — I4892 Unspecified atrial flutter: Secondary | ICD-10-CM | POA: Diagnosis not present

## 2023-07-31 DIAGNOSIS — N183 Chronic kidney disease, stage 3 unspecified: Secondary | ICD-10-CM | POA: Diagnosis not present

## 2023-07-31 DIAGNOSIS — I4892 Unspecified atrial flutter: Secondary | ICD-10-CM | POA: Diagnosis not present

## 2023-07-31 DIAGNOSIS — A498 Other bacterial infections of unspecified site: Secondary | ICD-10-CM | POA: Diagnosis not present

## 2023-07-31 DIAGNOSIS — Z515 Encounter for palliative care: Secondary | ICD-10-CM | POA: Diagnosis not present

## 2023-09-29 DEATH — deceased
